# Patient Record
Sex: Female | Born: 1950 | Race: Black or African American | Hispanic: No | Smoking: Never smoker
Health system: Southern US, Community
[De-identification: ages and names within clinical notes are randomized; demographics above are authoritative.]

## PROBLEM LIST (undated history)

## (undated) DIAGNOSIS — E119 Type 2 diabetes mellitus without complications: Secondary | ICD-10-CM

## (undated) DIAGNOSIS — E079 Disorder of thyroid, unspecified: Secondary | ICD-10-CM

## (undated) DIAGNOSIS — N189 Chronic kidney disease, unspecified: Secondary | ICD-10-CM

## (undated) DIAGNOSIS — T7840XA Allergy, unspecified, initial encounter: Secondary | ICD-10-CM

## (undated) DIAGNOSIS — Z950 Presence of cardiac pacemaker: Secondary | ICD-10-CM

## (undated) DIAGNOSIS — L80 Vitiligo: Secondary | ICD-10-CM

## (undated) DIAGNOSIS — R001 Bradycardia, unspecified: Secondary | ICD-10-CM

## (undated) DIAGNOSIS — M1990 Unspecified osteoarthritis, unspecified site: Secondary | ICD-10-CM

## (undated) DIAGNOSIS — E785 Hyperlipidemia, unspecified: Secondary | ICD-10-CM

## (undated) DIAGNOSIS — M797 Fibromyalgia: Secondary | ICD-10-CM

## (undated) DIAGNOSIS — M5430 Sciatica, unspecified side: Secondary | ICD-10-CM

## (undated) DIAGNOSIS — E039 Hypothyroidism, unspecified: Secondary | ICD-10-CM

## (undated) DIAGNOSIS — F411 Generalized anxiety disorder: Secondary | ICD-10-CM

## (undated) DIAGNOSIS — R569 Unspecified convulsions: Secondary | ICD-10-CM

## (undated) DIAGNOSIS — G4730 Sleep apnea, unspecified: Secondary | ICD-10-CM

## (undated) DIAGNOSIS — I639 Cerebral infarction, unspecified: Secondary | ICD-10-CM

## (undated) DIAGNOSIS — H269 Unspecified cataract: Secondary | ICD-10-CM

## (undated) DIAGNOSIS — G459 Transient cerebral ischemic attack, unspecified: Secondary | ICD-10-CM

## (undated) DIAGNOSIS — J449 Chronic obstructive pulmonary disease, unspecified: Secondary | ICD-10-CM

## (undated) DIAGNOSIS — I10 Essential (primary) hypertension: Secondary | ICD-10-CM

## (undated) DIAGNOSIS — F1190 Opioid use, unspecified, uncomplicated: Secondary | ICD-10-CM

## (undated) HISTORY — PX: LAPAROSCOPIC GASTRIC BAND REMOVAL WITH LAPAROSCOPIC GASTRIC SLEEVE RESECTION: SHX6498

## (undated) HISTORY — DX: Hyperlipidemia, unspecified: E78.5

## (undated) HISTORY — PX: COLONOSCOPY: SHX174

## (undated) HISTORY — DX: Cerebral infarction, unspecified: I63.9

## (undated) HISTORY — DX: Fibromyalgia: M79.7

## (undated) HISTORY — DX: Opioid use, unspecified, uncomplicated: F11.90

## (undated) HISTORY — PX: ABDOMINAL HYSTERECTOMY: SHX81

## (undated) HISTORY — PX: SPINE SURGERY: SHX786

## (undated) HISTORY — PX: LAPAROSCOPIC GASTRIC BANDING: SHX1100

## (undated) HISTORY — DX: Chronic obstructive pulmonary disease, unspecified: J44.9

## (undated) HISTORY — DX: Disorder of thyroid, unspecified: E07.9

## (undated) HISTORY — DX: Sleep apnea, unspecified: G47.30

## (undated) HISTORY — DX: Unspecified osteoarthritis, unspecified site: M19.90

## (undated) HISTORY — DX: Type 2 diabetes mellitus without complications: E11.9

## (undated) HISTORY — DX: Vitiligo: L80

## (undated) HISTORY — PX: PACEMAKER INSERTION: SHX728

## (undated) HISTORY — DX: Bradycardia, unspecified: R00.1

## (undated) HISTORY — PX: CHOLECYSTECTOMY: SHX55

## (undated) HISTORY — DX: Sciatica, unspecified side: M54.30

## (undated) HISTORY — DX: Unspecified convulsions: R56.9

## (undated) HISTORY — DX: Unspecified cataract: H26.9

## (undated) HISTORY — DX: Generalized anxiety disorder: F41.1

## (undated) HISTORY — DX: Allergy, unspecified, initial encounter: T78.40XA

## (undated) HISTORY — DX: Chronic kidney disease, unspecified: N18.9

## (undated) HISTORY — DX: Transient cerebral ischemic attack, unspecified: G45.9

---

## 2004-02-19 ENCOUNTER — Ambulatory Visit: Payer: Self-pay

## 2004-03-05 ENCOUNTER — Inpatient Hospital Stay: Payer: Self-pay

## 2004-03-05 ENCOUNTER — Other Ambulatory Visit: Payer: Self-pay

## 2004-04-06 ENCOUNTER — Ambulatory Visit: Payer: Self-pay | Admitting: Cardiovascular Disease

## 2005-02-22 ENCOUNTER — Ambulatory Visit: Payer: Self-pay

## 2005-08-19 ENCOUNTER — Ambulatory Visit: Payer: Self-pay

## 2006-04-01 ENCOUNTER — Emergency Department: Payer: Self-pay | Admitting: Unknown Physician Specialty

## 2006-06-17 DIAGNOSIS — M51379 Other intervertebral disc degeneration, lumbosacral region without mention of lumbar back pain or lower extremity pain: Secondary | ICD-10-CM | POA: Insufficient documentation

## 2006-10-19 DIAGNOSIS — I10 Essential (primary) hypertension: Secondary | ICD-10-CM | POA: Insufficient documentation

## 2006-10-20 DIAGNOSIS — E785 Hyperlipidemia, unspecified: Secondary | ICD-10-CM

## 2006-10-20 HISTORY — DX: Hyperlipidemia, unspecified: E78.5

## 2007-06-11 ENCOUNTER — Emergency Department: Payer: Self-pay | Admitting: Emergency Medicine

## 2007-06-12 ENCOUNTER — Other Ambulatory Visit: Payer: Self-pay

## 2008-05-17 ENCOUNTER — Inpatient Hospital Stay: Payer: Self-pay | Admitting: Specialist

## 2008-10-03 ENCOUNTER — Emergency Department: Payer: Self-pay | Admitting: Emergency Medicine

## 2008-12-09 DIAGNOSIS — I6529 Occlusion and stenosis of unspecified carotid artery: Secondary | ICD-10-CM | POA: Insufficient documentation

## 2009-02-09 DIAGNOSIS — F411 Generalized anxiety disorder: Secondary | ICD-10-CM

## 2009-02-09 HISTORY — DX: Generalized anxiety disorder: F41.1

## 2009-08-01 ENCOUNTER — Emergency Department: Payer: Self-pay | Admitting: Emergency Medicine

## 2009-12-20 ENCOUNTER — Inpatient Hospital Stay: Payer: Self-pay | Admitting: Internal Medicine

## 2010-02-24 ENCOUNTER — Inpatient Hospital Stay: Payer: Self-pay | Admitting: Internal Medicine

## 2010-04-26 DIAGNOSIS — G2581 Restless legs syndrome: Secondary | ICD-10-CM | POA: Insufficient documentation

## 2010-05-18 ENCOUNTER — Inpatient Hospital Stay: Payer: Self-pay | Admitting: *Deleted

## 2010-07-05 ENCOUNTER — Emergency Department: Payer: Self-pay | Admitting: Emergency Medicine

## 2010-09-14 ENCOUNTER — Emergency Department: Payer: Self-pay | Admitting: Emergency Medicine

## 2010-12-27 DIAGNOSIS — H5200 Hypermetropia, unspecified eye: Secondary | ICD-10-CM | POA: Insufficient documentation

## 2011-02-18 ENCOUNTER — Emergency Department: Payer: Self-pay | Admitting: Emergency Medicine

## 2011-05-19 ENCOUNTER — Inpatient Hospital Stay: Payer: Self-pay | Admitting: Internal Medicine

## 2012-09-01 DIAGNOSIS — M797 Fibromyalgia: Secondary | ICD-10-CM | POA: Insufficient documentation

## 2012-09-01 DIAGNOSIS — L80 Vitiligo: Secondary | ICD-10-CM | POA: Insufficient documentation

## 2012-09-01 DIAGNOSIS — R001 Bradycardia, unspecified: Secondary | ICD-10-CM | POA: Insufficient documentation

## 2012-09-01 DIAGNOSIS — R569 Unspecified convulsions: Secondary | ICD-10-CM | POA: Insufficient documentation

## 2013-08-28 DIAGNOSIS — R238 Other skin changes: Secondary | ICD-10-CM | POA: Insufficient documentation

## 2013-11-16 DIAGNOSIS — L659 Nonscarring hair loss, unspecified: Secondary | ICD-10-CM | POA: Insufficient documentation

## 2014-06-10 DIAGNOSIS — G894 Chronic pain syndrome: Secondary | ICD-10-CM | POA: Insufficient documentation

## 2014-07-15 DIAGNOSIS — Z6841 Body Mass Index (BMI) 40.0 and over, adult: Secondary | ICD-10-CM | POA: Insufficient documentation

## 2014-08-19 ENCOUNTER — Emergency Department
Admit: 2014-08-19 | Disposition: A | Discharge status: Home / Self Care | Payer: Self-pay | Admitting: Emergency Medicine

## 2014-09-12 NOTE — Discharge Summary (Signed)
Gardner NAME:  Carrie Gardner, WHAN MR#:  [id number] DATE OF BIRTH:  1951-02-13  DATE OF ADMISSION:  05/19/2011 DATE OF DISCHARGE:  05/20/2011  PRIMARY CARE PHYSICIAN:  Dr. Brynda Peon, [facility]. CARDIOLOGIST: [facility].  DISCHARGE DIAGNOSES:  1. Chest pain, likely noncardiac, likely migraine variant. Negative serial cardiac enzymes.  2. Head/neck pain, likely migraine, on sumatriptan as needed at home, which works well.  3. Hypothyroidism with elevated thyroid stimulating hormone. Adjusted dose of Synthroid.   SECONDARY DIAGNOSES:  1. Morbid obesity.  2. Gastroesophageal reflux disease. 3. Anxiety. 4. Anemia of chronic disease.  5. Restless leg syndrome. 6. Type 2 diabetes.  7. Hyperlipidemia.  8. Hypothyroidism.  9. Peripheral vascular disease.   CONSULTATION: Cardiology, Dr. Gwen Pounds.   PROCEDURES/RADIOLOGY: Chest x-ray on Carrie 29th of December showed no acute cardiopulmonary disease.   MAJOR LABORATORY PANEL: Urinalysis on admission was negative.   HISTORY AND SHORT HOSPITAL COURSE: Carrie Gardner is a 64 year old female with Carrie above-mentioned medical problems who was admitted for chest pain associated with sweating and shortness of breath which woke her up from sleep. Please see Dr. Judithann Sheen dictated history and physical for further details. She was admitted to telemetry. Ruled out with three negative sets of cardiac enzymes. She has a known history of hypothyroidism for which thyroid stimulating hormone was checked and was found to be elevated with a value of 7.28. Her Synthroid dose was increased from 112 mcg to 125 mcg. She was doing much better. On 12/30, did not have any subsequent chest pain. Cardiology consult was obtained with Dr. Gwen Pounds who agreed with Carrie management and recommended discharge with outpatient follow-up which was done.   PERTINENT PHYSICAL EXAMINATION:  On Carrie date of discharge, her vital signs were as follows: Temperature 98.1, heart rate 74 per minute, respirations 18 per  minute, blood pressure 142/91. She was saturating 97% on room air. CARDIOVASCULAR: S1, S2 normal. No murmurs, rubs, or gallop. LUNGS: Clear to auscultation bilaterally. No wheezing, rales, rhonchi, or crepitation. ABDOMEN: Soft, benign, obesity. NEUROLOGICAL: Nonfocal examination. All other physical examination remained at baseline.   DISCHARGE MEDICATIONS:  1. Benadryl 25 mg 2 tablets p.o. every six hours.  2. Tramadol 50 mg 1 tablet p.o. every six hours as needed.  3. Metoprolol 25 mg p.o. b.i.d.  4. Omeprazole 20 mg p.o. daily. 5. Lipitor 20 mg p.o. daily. 6. Aspirin 325 mg p.o. daily.  7. Gabapentin 300 mg p.o. 3 times a day.  8. Lipitor 20 mg p.o. at bedtime. 9. Ropinirole 2 mg p.o. daily.  10. Citalopram 20 mg p.o. daily.  11. Iron sulfate once daily.  12. Lisinopril 5 mg p.o. daily.  13. Insulin Lantus 15 units subcutaneous at bedtime.  14. NovoLog subcutaneous 8 units 3 times a day.  15. Levothyroxine 125 micrograms p.o. daily.   DISCHARGE DIET: Low sodium, 1,800 ADA.   DISCHARGE ACTIVITY: As tolerated.   DISCHARGE INSTRUCTIONS AND FOLLOW-UP:  1. Carrie Gardner was instructed follow-up with her primary care physician, Dr. Brynda Peon at [facility] in 1 to 2 weeks.  2. She will need follow-up with [facility] in one week.     TOTAL TIME DISCHARGING THIS Gardner: 55 minutes.    ____________________________ Ellamae Sia. Sherryll Burger, MD vss:ap D: 05/20/2011 20:36:49 ET T: 05/24/2011 10:39:52 ET JOB#: [id number]  cc: Lyndel Dancel S. Sherryll Burger, MD, <Dictator> [facility] Cardiology Dr. Brynda Peon, [facility]

## 2014-09-12 NOTE — Discharge Summary (Signed)
PATIENT NAME:  Carrie Gardner, Carrie Gardner MR#:  [id number] DATE OF BIRTH:  1950/10/23  DATE OF ADMISSION:  05/19/2011 DATE OF DISCHARGE:  05/20/2011  PRIMARY CARE PHYSICIAN:  Dr. Brynda Peon, [facility]. CARDIOLOGIST: [facility].  DISCHARGE DIAGNOSES:  1. Chest pain, likely noncardiac, likely migraine variant. Negative serial cardiac enzymes.  2. Head/neck pain, likely migraine, on sumatriptan as needed at home, which works well.  3. Hypothyroidism with elevated thyroid stimulating hormone. Adjusted dose of Synthroid.   SECONDARY DIAGNOSES:  1. Morbid obesity.  2. Gastroesophageal reflux disease. 3. Anxiety. 4. Anemia of chronic disease.  5. Restless leg syndrome. 6. Type 2 diabetes.  7. Hyperlipidemia.  8. Hypothyroidism.  9. Peripheral vascular disease.   CONSULTATION: Cardiology, Dr. Gwen Pounds.   PROCEDURES/RADIOLOGY: Chest x-ray on the 29th of December showed no acute cardiopulmonary disease.   MAJOR LABORATORY PANEL: Urinalysis on admission was negative.   HISTORY AND SHORT HOSPITAL COURSE: The patient is a 64 year old female with the above-mentioned medical problems who was admitted for chest pain associated with sweating and shortness of breath which woke her up from sleep. Please see Dr. Judithann Sheen' dictated history and physical for further details. She was admitted to telemetry. Ruled out with three negative sets of cardiac enzymes. She has a known history of hypothyroidism for which thyroid stimulating hormone was checked and was found to be elevated with a value of 7.28. Her Synthroid dose was increased from 112 mcg to 125 mcg. She was doing much better. On 12/30, did not have any subsequent chest pain. Cardiology consult was obtained with Dr. Gwen Pounds who agreed with the management and recommended discharge with outpatient follow-up which was done.   PERTINENT PHYSICAL EXAMINATION:  On the date of discharge, her vital signs were as follows: Temperature 98.1, heart rate 74 per minute, respirations 18  per minute, blood pressure 142/91. She was saturating 97% on room air. CARDIOVASCULAR: S1, S2 normal. No murmurs, rubs, or gallop. LUNGS: Clear to auscultation bilaterally. No wheezing, rales, rhonchi, or crepitation. ABDOMEN: Soft, benign, obesity. NEUROLOGICAL: Nonfocal examination. All other physical examination remained at baseline.   DISCHARGE MEDICATIONS:  1. Benadryl 25 mg 2 tablets p.o. every six hours.  2. Tramadol 50 mg 1 tablet p.o. every six hours as needed.  3. Metoprolol 25 mg p.o. b.i.d.  4. Omeprazole 20 mg p.o. daily. 5. Lipitor 20 mg p.o. daily. 6. Aspirin 325 mg p.o. daily.  7. Gabapentin 300 mg p.o. 3 times a day.  8. Lipitor 20 mg p.o. at bedtime. 9. Ropinirole 2 mg p.o. daily.  10. Citalopram 20 mg p.o. daily.  11. Iron sulfate once daily.  12. Lisinopril 5 mg p.o. daily.  13. Insulin Lantus 15 units subcutaneous at bedtime.  14. NovoLog subcutaneous 8 units 3 times a day.  15. Levothyroxine 125 micrograms p.o. daily.   DISCHARGE DIET: Low sodium, 1,800 ADA.   DISCHARGE ACTIVITY: As tolerated.   DISCHARGE INSTRUCTIONS AND FOLLOW-UP:  1. The patient was instructed follow-up with her primary care physician, Dr. Brynda Peon at [facility] in 1 to 2 weeks.  2. She will need follow-up with [facility] in one week.     TOTAL TIME DISCHARGING THIS PATIENT: 55 minutes.    ____________________________ Ellamae Sia. Sherryll Burger, MD vss:ap D: 05/20/2011 20:36:00 ET T: 05/24/2011 10:39:52 ET JOB#: [id number]  cc: [facility] Cardiology Chidiebere Wynn S. Sherryll Burger, MD, <Dictator> Dr. Brynda Peon, [facility]  Ellamae Sia [facility] MD ELECTRONICALLY SIGNED 05/27/2011 22:20

## 2015-02-09 ENCOUNTER — Encounter: Payer: Self-pay | Admitting: Anesthesiology

## 2015-02-09 ENCOUNTER — Ambulatory Visit: Payer: Medicare Other | Attending: Anesthesiology | Admitting: Anesthesiology

## 2015-02-09 VITALS — BP 160/76 | HR 89 | Temp 97.7°F | Resp 18 | Ht 62.0 in | Wt 245.0 lb

## 2015-02-09 DIAGNOSIS — E119 Type 2 diabetes mellitus without complications: Secondary | ICD-10-CM

## 2015-02-09 DIAGNOSIS — E669 Obesity, unspecified: Secondary | ICD-10-CM

## 2015-02-09 DIAGNOSIS — M545 Low back pain, unspecified: Secondary | ICD-10-CM

## 2015-02-09 DIAGNOSIS — M5136 Other intervertebral disc degeneration, lumbar region: Secondary | ICD-10-CM | POA: Diagnosis not present

## 2015-02-09 DIAGNOSIS — G8929 Other chronic pain: Secondary | ICD-10-CM | POA: Insufficient documentation

## 2015-02-09 DIAGNOSIS — R001 Bradycardia, unspecified: Secondary | ICD-10-CM

## 2015-02-09 DIAGNOSIS — Z950 Presence of cardiac pacemaker: Secondary | ICD-10-CM | POA: Diagnosis not present

## 2015-02-09 DIAGNOSIS — M51379 Other intervertebral disc degeneration, lumbosacral region without mention of lumbar back pain or lower extremity pain: Secondary | ICD-10-CM

## 2015-02-09 DIAGNOSIS — I739 Peripheral vascular disease, unspecified: Secondary | ICD-10-CM | POA: Diagnosis not present

## 2015-02-09 DIAGNOSIS — L80 Vitiligo: Secondary | ICD-10-CM

## 2015-02-09 DIAGNOSIS — I10 Essential (primary) hypertension: Secondary | ICD-10-CM

## 2015-02-09 DIAGNOSIS — M5416 Radiculopathy, lumbar region: Secondary | ICD-10-CM | POA: Diagnosis not present

## 2015-02-09 DIAGNOSIS — M5137 Other intervertebral disc degeneration, lumbosacral region: Secondary | ICD-10-CM

## 2015-02-09 NOTE — Patient Instructions (Signed)
Epidural Steroid Injection Patient Information  Description: The epidural space surrounds the nerves as they exit the spinal cord.  In some patients, the nerves can be compressed and inflamed by a bulging disc or a tight spinal canal (spinal stenosis).  By injecting steroids into the epidural space, we can bring irritated nerves into direct contact with a potentially helpful medication.  These steroids act directly on the irritated nerves and can reduce swelling and inflammation which often leads to decreased pain.  Epidural steroids may be injected anywhere along the spine and from the neck to the low back depending upon the location of your pain.   After numbing the skin with local anesthetic (like Novocaine), a small needle is passed into the epidural space slowly.  You may experience a sensation of pressure while this is being done.  The entire block usually last less than 10 minutes.  Conditions which may be treated by epidural steroids:   Low back and leg pain  Neck and arm pain  Spinal stenosis  Post-laminectomy syndrome  Herpes zoster (shingles) pain  Pain from compression fractures  Preparation for the injection:  1. Do not eat any solid food or dairy products within 6 hours of your appointment.  2. You may drink clear liquids up to 2 hours before appointment.  Clear liquids include water, black coffee, juice or soda.  No milk or cream please. 3. You may take your regular medication, including pain medications, with a sip of water before your appointment  Diabetics should hold regular insulin (if taken separately) and take 1/2 normal NPH dos the morning of the procedure.  Carry some sugar containing items with you to your appointment. 4. A driver must accompany you and be prepared to drive you home after your procedure.  5. Bring all your current medications with your. 6. An IV may be inserted and sedation may be given at the discretion of the physician.   7. A blood pressure  cuff, EKG and other monitors will often be applied during the procedure.  Some patients may need to have extra oxygen administered for a short period. 8. You will be asked to provide medical information, including your allergies, prior to the procedure.  We must know immediately if you are taking blood thinners (like Coumadin/Warfarin)  Or if you are allergic to IV iodine contrast (dye). We must know if you could possible be pregnant.  Possible side-effects:  Bleeding from needle site  Infection (rare, may require surgery)  Nerve injury (rare)  Numbness & tingling (temporary)  Difficulty urinating (rare, temporary)  Spinal headache ( a headache worse with upright posture)  Light -headedness (temporary)  Pain at injection site (several days)  Decreased blood pressure (temporary)  Weakness in arm/leg (temporary)  Pressure sensation in back/neck (temporary)  Call if you experience:  Fever/chills associated with headache or increased back/neck pain.  Headache worsened by an upright position.  New onset weakness or numbness of an extremity below the injection site  Hives or difficulty breathing (go to the emergency room)  Inflammation or drainage at the infection site  Severe back/neck pain  Any new symptoms which are concerning to you  Please note:  Although the local anesthetic injected can often make your back or neck feel good for several hours after the injection, the pain will likely return.  It takes 3-7 days for steroids to work in the epidural space.  You may not notice any pain relief for at least that one week.    If effective, we will often do a series of three injections spaced 3-6 weeks apart to maximally decrease your pain.  After the initial series, we generally will wait several months before considering a repeat injection of the same type.  If you have any questions, please call [phone] [facility] 

## 2015-02-09 NOTE — Progress Notes (Signed)
Safety precautions to be maintained throughout the outpatient stay will include: orient to surroundings, keep bed in low position, maintain call bell within reach at all times, provide assistance with transfer out of bed and ambulation.  

## 2015-02-10 NOTE — Progress Notes (Signed)
Subjective:    Patient ID: Carrie Gardner, female    DOB: 06-Mar-1951, 64 y.o.   MRN: [id number]  HPI  This patient is a pleasant delightful 64 year old lady who came in complaining chronic low back pain.  Her pain was localized in the caudal ar and has been present for the past 7. The pain started spontaneously and was not associated with any trauma or injury. She has been treated with injections epidurals medicines patches and physical therapy. She describes her pa as sharp and intermittent and that it radiates down both legs with the left being worse than the right. The pain radiates the lower legs down into the ankles and the feet and she experiences cramps in both great toes. Her subjective pain intensity rating  Is 80% Her pain is relieved by medications and hot  Pads and the pain is aggravated by gener especially prolonged walking.  Pain medication Currently she takes hydrocodone with acetaminophen 10/325 mg 1 tabl every 6 hours  Other medications Other medications include aspirin  Lipitor Plavix Lasix insulin Novolin Synthroid lisinopril Mirapex and Requip and venlafaxine.  Allergies She is allergic to tramadol IV dye metformin and Lyrica  Past medical history Past medical history is positive for diabetes hypercholesterolemia hypertension peripheral vascular disease bradycardia fibromyalgia and vitiligo  Past surgical history Past surgical history is positive for total abdominal hystere salpingo-oophorectomy cholecystectomy a LAP-BAND surgery and back surgery.  He is also had excision of lipoma from the right shoulder.  Social and economic history ThIs patient does not smoke cigarettes She does not use illicit drugs She uses alcohol occasionally  Family history Her mother is deceased at age 37 from kidney failure Her father is deceased at age 57 from a cerebrovascular accident She has no brothers  She has no sisters She has been divorced for 10 yearsand she has one  child age 63 was Karie Mainland and one granddaughter aged 22 and she a Programmer, multimedia.   Review of Systems  Constitutional: Negative for fever, chills, diaphoresis, activity change, appetite change, fatigue and unexpected weight change.  HENT: Negative for congestion, dental problem, drooling, ear discharge, ear pain, facial swelling, hearing loss, mouth sores, nosebleeds, postnasal drip, rhinorrhea and sinus pressure.   Eyes: Negative.  Negative for photophobia, pain, discharge, redness, itching and visual disturbance.  Respiratory: Negative.  Negative for apnea, cough, choking, chest tightness, shortness of breath, wheezing and stridor.   Cardiovascular: Negative for chest pain, palpitations and leg swelling.  Endocrine: Negative.   Genitourinary: Negative.   Musculoskeletal: Positive for back pain and arthralgias. Negative for myalgias, joint swelling, gait problem, neck pain and neck stiffness.  Skin: Positive for color change. Negative for pallor, rash and wound.       The patient displayed significant depigmentation characteristic of vitiligo in both lower forearms and in both hands and feet  Allergic/Immunologic: Negative.   Neurological: Negative.   Hematological: Negative.   Psychiatric/Behavioral: Negative.        Objective:   Physical Exam  Constitutional: She is oriented to person, place, and time. She appears well-developed and well-nourished.  This patient is currently weighing 244 pounds. This represents a significant decrease from her prior weight of 300 pounds. She attributes this decreased to the LAP-BAND surgery and her diet.  HENT:  Head: Normocephalic and atraumatic.  Right Ear: External ear normal.  Nose: Nose normal.  Mouth/Throat: Oropharynx is clear and moist.  Eyes: Conjunctivae and EOM are normal. Pupils are equal, round, and reactive to light.  Right eye exhibits no discharge. Left eye exhibits no discharge. No scleral icterus.  Neck: Normal range of motion. Neck supple.  No JVD present. No tracheal deviation present. No thyromegaly present.  Cardiovascular: Normal rate, regular rhythm, normal heart sounds and intact distal pulses.  Exam reveals no gallop and no friction rub.   No murmur heard. HHeart sounds 1 and 2 were heard in er blood pressure is 160/75 mmHg Her pulse is 89 bpm Equal and regular Heart sounds 1 and 2 were heard in all areas There were no audible murmurs   Pulmonary/Chest: Effort normal and breath sounds normal. No respiratory distress. She has no wheezes. She has no rales. She exhibits no tenderness.  Abdominal: Soft. Bowel sounds are normal. She exhibits no distension and no mass. There is no tenderness. There is no rebound.  Genitourinary:  Genitourinary exam was deferred  Musculoskeletal: She exhibits no edema or tenderness.  Range of motion was decreased in both lower extremites. The torsion tests was positive espec Straight leg raising tests were  Normal on both sides There was tenderness in the sacrococcygeal area and the region of L5  Lymphadenopathy:    She has no cervical adenopathy.  Neurological: She is alert and oriented to person, place, and time. She has normal reflexes. She displays normal reflexes. No cranial nerve deficit. She exhibits normal muscle tone. Coordination normal.  Skin: Skin is warm and dry. Rash noted. No pallor.  She had pronounced vitiligo affecting both hands and feet and the lower aspects of her both forearms  Psychiatric: She has a normal mood and affect. Her behavior is normal. Judgment and thought content normal.  Nursing note and vitals reviewed.      Assessment & Plan:   Assessment 1 CHRONIC LOW BACK PAIN 2 LUMBAR DEGENERATIVE DISC DISEASE 3 LUMBAR RADICULOPATHY 4 OBESITY 5 STATUS POST PACEMAKER iMPLANTATION 6 BRADYCARDIA 7 PERIPHERAL VASCULAR DISEASE 8 VITILIGO 9 DIABETES MELLITUS 10 HYPERTENSION    PLAN OF MANAGEMENT 1 CAUDAL EPIDURAL STEROID INJECTION WITH ANTIBIOTIC  PROPHYLAXIS AND NO CONTRAST MEDIA WOULD BE USED 2 CONSIDER OTHER THERAPEUTIC OPTIONS IF THE CAUDAL EPIDURAL STEROID INJECTION IS NOT SUCCESSFUL   New patient   Level 4   Winston Parris M.D.

## 2015-02-11 ENCOUNTER — Ambulatory Visit: Payer: Medicare Other | Attending: Anesthesiology | Admitting: Anesthesiology

## 2015-02-11 ENCOUNTER — Encounter: Payer: Self-pay | Admitting: Anesthesiology

## 2015-02-11 VITALS — BP 152/81 | HR 83 | Temp 98.0°F | Resp 16 | Ht 62.0 in | Wt 244.0 lb

## 2015-02-11 DIAGNOSIS — M5136 Other intervertebral disc degeneration, lumbar region: Secondary | ICD-10-CM | POA: Insufficient documentation

## 2015-02-11 DIAGNOSIS — G8929 Other chronic pain: Secondary | ICD-10-CM | POA: Insufficient documentation

## 2015-02-11 DIAGNOSIS — M545 Low back pain, unspecified: Secondary | ICD-10-CM

## 2015-02-11 DIAGNOSIS — M5416 Radiculopathy, lumbar region: Secondary | ICD-10-CM | POA: Insufficient documentation

## 2015-02-11 DIAGNOSIS — M5137 Other intervertebral disc degeneration, lumbosacral region: Secondary | ICD-10-CM

## 2015-02-11 MED ORDER — HYDROCODONE-ACETAMINOPHEN 10-325 MG PO TABS: 1.0000 | ORAL_TABLET | Freq: Three times a day (TID) | ORAL | Status: DC | PRN

## 2015-02-11 MED ORDER — BUPIVACAINE HCL (PF) 0.25 % IJ SOLN
INTRAMUSCULAR | Status: AC
  Administered 2015-02-11: 10:00:00
  Filled 2015-02-11: qty 30

## 2015-02-11 MED ORDER — MIDAZOLAM HCL 5 MG/5ML IJ SOLN
INTRAMUSCULAR | Status: AC
  Administered 2015-02-11: 2 mg via INTRAVENOUS
  Filled 2015-02-11: qty 5

## 2015-02-11 MED ORDER — FENTANYL CITRATE (PF) 100 MCG/2ML IJ SOLN
INTRAMUSCULAR | Status: AC
  Administered 2015-02-11: 50 ug via INTRAVENOUS
  Filled 2015-02-11: qty 2

## 2015-02-11 MED ORDER — LIDOCAINE HCL (PF) 1 % IJ SOLN
INTRAMUSCULAR | Status: AC
  Filled 2015-02-11: qty 5

## 2015-02-11 MED ORDER — TRIAMCINOLONE ACETONIDE 40 MG/ML IJ SUSP
INTRAMUSCULAR | Status: AC
  Administered 2015-02-11: 10:00:00
  Filled 2015-02-11: qty 2

## 2015-02-11 NOTE — Procedures (Signed)
Date of procedure: 02/11/2015  Preoperative Diagnosis:  1 chronic low back pain 2 lumbar degenerative disc disease 3 lumbar radiculopathy  Postoperative Diagnosis:  Same.  Procedure: 1. Caudal epidural steroid injection, 2.   Fluoroscopic guidance.  Surgeon: Tod Persia, MD  Anesthesia: MAC anesthesia  by the nurse and staff under my direction  Informed consent was obtained and the patient appeared to accept and understand the benefits and risks of this procedure.   Pre procedure comments:  Patient was allergic to contrast media so no contrast was used in this procedure Thus, no epidurography was performed  Description of the Procedure:  The patient was taken to the operating room and placed in the prone position.  Intravenous sedation and MAC anesthesia was administered by the nurse and staff under my direction. After appropriate sedation, the sacrococcygeal area was prepped with Betadine.  After adequate draping, the area between the sacral cornu was palpated and infiltrated with 3 cc of 1% Lidocaine.   An AP fluoroscopic view of the sacrum was visualized and a 17 gauge Tuohy needle was inserted in the midline at the angle of 45 degrees through the sacrococcygeal membrane.  After making contact with the bone, the needle was withdrawn and readvanced in horizontal position, into the caudal epidural space.  Epidurogram Study: No epidurogram study was performed Needle verification was done by you in its placement in both AP and the lateral views    Comments: No catheter was used  Caudal Epidural Steroid Injection:  Then 10 cc of 0.25% Bupivacaine and 15 mg of  were injected into the Caudal epidural space.  The needle was removed and adequate hemostasis was established.    The patient tolerated the procedure quite well and vital signs were stable.  There were no adverse effects.  Additional comments:    The patient was taken to the recovery room in satisfactory  condition where the patient was observed and subsequently discharged home.  Will follow up in the clinic in the next week.   Tod Persia M.D.

## 2015-02-11 NOTE — Patient Instructions (Signed)
Epidural Steroid Injection Patient Information  Description: The epidural space surrounds the nerves as they exit the spinal cord.  In some patients, the nerves can be compressed and inflamed by a bulging disc or a tight spinal canal (spinal stenosis).  By injecting steroids into the epidural space, we can bring irritated nerves into direct contact with a potentially helpful medication.  These steroids act directly on the irritated nerves and can reduce swelling and inflammation which often leads to decreased pain.  Epidural steroids may be injected anywhere along the spine and from the neck to the low back depending upon the location of your pain.   After numbing the skin with local anesthetic (like Novocaine), a small needle is passed into the epidural space slowly.  You may experience a sensation of pressure while this is being done.  The entire block usually last less than 10 minutes.  Conditions which may be treated by epidural steroids:   Low back and leg pain  Neck and arm pain  Spinal stenosis  Post-laminectomy syndrome  Herpes zoster (shingles) pain  Pain from compression fractures  Preparation for the injection:  1. Do not eat any solid food or dairy products within 6 hours of your appointment.  2. You may drink clear liquids up to 2 hours before appointment.  Clear liquids include water, black coffee, juice or soda.  No milk or cream please. 3. You may take your regular medication, including pain medications, with a sip of water before your appointment  Diabetics should hold regular insulin (if taken separately) and take 1/2 normal NPH dos the morning of the procedure.  Carry some sugar containing items with you to your appointment. 4. A driver must accompany you and be prepared to drive you home after your procedure.  5. Bring all your current medications with your. 6. An IV may be inserted and sedation may be given at the discretion of the physician.   7. A blood pressure  cuff, EKG and other monitors will often be applied during the procedure.  Some patients may need to have extra oxygen administered for a short period. 8. You will be asked to provide medical information, including your allergies, prior to the procedure.  We must know immediately if you are taking blood thinners (like Coumadin/Warfarin)  Or if you are allergic to IV iodine contrast (dye). We must know if you could possible be pregnant.  Possible side-effects:  Bleeding from needle site  Infection (rare, may require surgery)  Nerve injury (rare)  Numbness & tingling (temporary)  Difficulty urinating (rare, temporary)  Spinal headache ( a headache worse with upright posture)  Light -headedness (temporary)  Pain at injection site (several days)  Decreased blood pressure (temporary)  Weakness in arm/leg (temporary)  Pressure sensation in back/neck (temporary)  Call if you experience:  Fever/chills associated with headache or increased back/neck pain.  Headache worsened by an upright position.  New onset weakness or numbness of an extremity below the injection site  Hives or difficulty breathing (go to the emergency room)  Inflammation or drainage at the infection site  Severe back/neck pain  Any new symptoms which are concerning to you  Please note:  Although the local anesthetic injected can often make your back or neck feel good for several hours after the injection, the pain will likely return.  It takes 3-7 days for steroids to work in the epidural space.  You may not notice any pain relief for at least that one week.    If effective, we will often do a series of three injections spaced 3-6 weeks apart to maximally decrease your pain.  After the initial series, we generally will wait several months before considering a repeat injection of the same type.  If you have any questions, please call [phone] [facility]  Discharge Instructions  General Discharge Instructions :  If you need to reach your doctor call: Monday-Friday 8:00 am - 4:00 pm at [phone] or toll free [phone].  After clinic hours [phone] to have operator reach doctor.  Bring all of your medication bottles to all your appointments in the pain clinic.  To cancel or reschedule your appointment with Pain Management please remember to call 24 hours in advance to avoid a fee.  Refer to the educational materials which you have been given on: General Risks, I had my Procedure. Discharge Instructions, Post Sedation.  Post Procedure Instructions:  The drugs you were given will stay in your system until tomorrow, so for the next 24 hours you should not drive, make any legal decisions or drink any alcoholic beverages.  You may eat anything you prefer, but it is better to start with liquids then soups and crackers, and gradually work up to solid foods.  Please notify your doctor immediately if you have any unusual bleeding, trouble breathing or pain that is not related to your normal pain.  Depending on the type of procedure that was done, some parts of your body may feel week and/or numb.  This usually clears up by tonight or the next day.  Walk with the use of an assistive device or accompanied by an adult for the 24 hours.  You may use ice on the affected area for the first 24 hours.  Put ice in a Ziploc bag and cover with a towel and place against area 15 minutes on 15 minutes off.  You may switch to heat after 24 hours. 

## 2015-02-11 NOTE — Progress Notes (Signed)
Safety precautions to be maintained throughout the outpatient stay will include: orient to surroundings, keep bed in low position, maintain call bell within reach at all times, provide assistance with transfer out of bed and ambulation.  

## 2015-02-14 ENCOUNTER — Telehealth: Payer: Self-pay | Admitting: *Deleted

## 2015-02-14 NOTE — Telephone Encounter (Signed)
No problems post procedure phone call. 

## 2015-02-23 ENCOUNTER — Ambulatory Visit: Payer: Medicare Other | Attending: Anesthesiology | Admitting: Anesthesiology

## 2015-02-23 ENCOUNTER — Encounter: Payer: Self-pay | Admitting: Anesthesiology

## 2015-02-23 VITALS — BP 152/97 | HR 78 | Temp 97.7°F | Resp 20 | Ht 62.0 in | Wt 240.0 lb

## 2015-02-23 DIAGNOSIS — M545 Low back pain, unspecified: Secondary | ICD-10-CM

## 2015-02-23 DIAGNOSIS — M5136 Other intervertebral disc degeneration, lumbar region: Secondary | ICD-10-CM | POA: Diagnosis not present

## 2015-02-23 DIAGNOSIS — G8929 Other chronic pain: Secondary | ICD-10-CM | POA: Insufficient documentation

## 2015-02-23 DIAGNOSIS — E669 Obesity, unspecified: Secondary | ICD-10-CM | POA: Diagnosis not present

## 2015-02-23 DIAGNOSIS — M5137 Other intervertebral disc degeneration, lumbosacral region: Secondary | ICD-10-CM

## 2015-02-23 DIAGNOSIS — M5416 Radiculopathy, lumbar region: Secondary | ICD-10-CM | POA: Diagnosis not present

## 2015-02-23 MED ORDER — HYDROCODONE-ACETAMINOPHEN 10-325 MG PO TABS: 1.0000 | ORAL_TABLET | Freq: Two times a day (BID) | ORAL | Status: DC | PRN

## 2015-02-23 NOTE — Progress Notes (Signed)
Safety precautions to be maintained throughout the outpatient stay will include: orient to surroundings, keep bed in low position, maintain call bell within reach at all times, provide assistance with transfer out of bed and ambulation.  

## 2015-02-23 NOTE — Progress Notes (Signed)
   Subjective:    Patient ID: Carrie Gardner, female    DOB: Apr 18, 1951, 64 y.o.   MRN: [id number] This patient has returned to the clinic today indicating that she is doing quite well She appears in good spirits She indicated that following the caudal epidural steroid injection she got relief for several days but that the pain is gradually recovering Today her subjective pain intensity rating is 70% She indicates that she would love to have another caudal epidural steroid injection She had a flu shot today and would prefer to put off the injection for another 2 weeks She indicates that she is doing a little better with her activities of daily living  HPI    Review of Systems  Constitutional: Negative.   HENT: Negative.   Eyes: Negative.   Respiratory: Negative.   Cardiovascular: Negative.   Gastrointestinal: Negative.   Endocrine: Negative.   Genitourinary: Negative.   Musculoskeletal: Negative.   Skin: Negative.   Allergic/Immunologic: Negative.   Neurological: Negative.   Hematological: Negative.   Psychiatric/Behavioral: Negative.        Objective:   Physical Exam  Physical exam is unremarkable  Pressure is 152/97 mmHg  Pulse is 78 bpm Equal and regular Heart sounds 1 and 2 were heard in all areas There were no audible murmurs   temperature is 97.11F Respirations are 20 breaths per minute SPO2 was 100% Chest is clinically clear and there are no adventitious sounds  Her weight is 240 pound There is mild decrease in her range of motion of the lower extremities There are no new neurological or musculoskeletal findings Pupils are equal and reactive Cranial nerves are intact     Assessment & Plan:  Assessment 1 chronic low back pain 2 lumbar degenerative disc disease 3 lumbar radiculopathy 4 mild obesity   Plan of management Plan to repeat a caudal epidural steroid injection for her in the next 2 weeks since she had a flu shot recently I spent 50 minutes  chatted with her body weight management and change in her lifestyle as far as her diet is considered We'll plan to do the procedure for her in 2 weeks  Established patient   level II   Tod Persia M.D.

## 2015-02-23 NOTE — Patient Instructions (Signed)
Epidural Steroid Injection Patient Information  Description: The epidural space surrounds the nerves as they exit the spinal cord.  In some patients, the nerves can be compressed and inflamed by a bulging disc or a tight spinal canal (spinal stenosis).  By injecting steroids into the epidural space, we can bring irritated nerves into direct contact with a potentially helpful medication.  These steroids act directly on the irritated nerves and can reduce swelling and inflammation which often leads to decreased pain.  Epidural steroids may be injected anywhere along the spine and from the neck to the low back depending upon the location of your pain.   After numbing the skin with local anesthetic (like Novocaine), a small needle is passed into the epidural space slowly.  You may experience a sensation of pressure while this is being done.  The entire block usually last less than 10 minutes.  Conditions which may be treated by epidural steroids:   Low back and leg pain  Neck and arm pain  Spinal stenosis  Post-laminectomy syndrome  Herpes zoster (shingles) pain  Pain from compression fractures  Preparation for the injection:  1. Do not eat any solid food or dairy products within 6 hours of your appointment.  2. You may drink clear liquids up to 2 hours before appointment.  Clear liquids include water, black coffee, juice or soda.  No milk or cream please. 3. You may take your regular medication, including pain medications, with a sip of water before your appointment  Diabetics should hold regular insulin (if taken separately) and take 1/2 normal NPH dos the morning of the procedure.  Carry some sugar containing items with you to your appointment. 4. A driver must accompany you and be prepared to drive you home after your procedure.  5. Bring all your current medications with your. 6. An IV may be inserted and sedation may be given at the discretion of the physician.   7. A blood pressure  cuff, EKG and other monitors will often be applied during the procedure.  Some patients may need to have extra oxygen administered for a short period. 8. You will be asked to provide medical information, including your allergies, prior to the procedure.  We must know immediately if you are taking blood thinners (like Coumadin/Warfarin)  Or if you are allergic to IV iodine contrast (dye). We must know if you could possible be pregnant.  Possible side-effects:  Bleeding from needle site  Infection (rare, may require surgery)  Nerve injury (rare)  Numbness & tingling (temporary)  Difficulty urinating (rare, temporary)  Spinal headache ( a headache worse with upright posture)  Light -headedness (temporary)  Pain at injection site (several days)  Decreased blood pressure (temporary)  Weakness in arm/leg (temporary)  Pressure sensation in back/neck (temporary)  Call if you experience:  Fever/chills associated with headache or increased back/neck pain.  Headache worsened by an upright position.  New onset weakness or numbness of an extremity below the injection site  Hives or difficulty breathing (go to the emergency room)  Inflammation or drainage at the infection site  Severe back/neck pain  Any new symptoms which are concerning to you  Please note:  Although the local anesthetic injected can often make your back or neck feel good for several hours after the injection, the pain will likely return.  It takes 3-7 days for steroids to work in the epidural space.  You may not notice any pain relief for at least that one week.    If effective, we will often do a series of three injections spaced 3-6 weeks apart to maximally decrease your pain.  After the initial series, we generally will wait several months before considering a repeat injection of the same type.  If you have any questions, please call [phone] [facility] 

## 2015-02-23 NOTE — Addendum Note (Signed)
Addended by: Lorik Guo on: 02/23/2015 02:19 PM   Modules accepted: Orders  

## 2015-03-10 ENCOUNTER — Telehealth: Payer: Self-pay | Admitting: Anesthesiology

## 2015-03-10 ENCOUNTER — Emergency Department
Admission: EM | Admit: 2015-03-10 | Discharge: 2015-03-10 | Disposition: A | Discharge status: Home / Self Care | Payer: Medicare Other | Attending: Emergency Medicine | Admitting: Emergency Medicine

## 2015-03-10 ENCOUNTER — Encounter: Payer: Self-pay | Admitting: Medical Oncology

## 2015-03-10 DIAGNOSIS — M549 Dorsalgia, unspecified: Secondary | ICD-10-CM | POA: Diagnosis not present

## 2015-03-10 DIAGNOSIS — Z760 Encounter for issue of repeat prescription: Secondary | ICD-10-CM | POA: Diagnosis present

## 2015-03-10 DIAGNOSIS — Z79899 Other long term (current) drug therapy: Secondary | ICD-10-CM | POA: Diagnosis not present

## 2015-03-10 DIAGNOSIS — G8929 Other chronic pain: Secondary | ICD-10-CM

## 2015-03-10 DIAGNOSIS — E119 Type 2 diabetes mellitus without complications: Secondary | ICD-10-CM | POA: Insufficient documentation

## 2015-03-10 DIAGNOSIS — Z7982 Long term (current) use of aspirin: Secondary | ICD-10-CM | POA: Insufficient documentation

## 2015-03-10 DIAGNOSIS — Z7902 Long term (current) use of antithrombotics/antiplatelets: Secondary | ICD-10-CM | POA: Insufficient documentation

## 2015-03-10 DIAGNOSIS — Z794 Long term (current) use of insulin: Secondary | ICD-10-CM | POA: Insufficient documentation

## 2015-03-10 MED ORDER — HYDROCODONE-ACETAMINOPHEN 10-325 MG PO TABS: 1.0000 | ORAL_TABLET | Freq: Two times a day (BID) | ORAL | Status: DC | PRN

## 2015-03-10 NOTE — Discharge Instructions (Signed)
Chronic Back Pain  When back pain lasts longer than 3 months, it is called chronic back pain.People with chronic back pain often go through certain periods that are more intense (flare-ups).  CAUSES Chronic back pain can be caused by wear and tear (degeneration) on different structures in your back. These structures include:  The bones of your spine (vertebrae) and the joints surrounding your spinal cord and nerve roots (facets).  The strong, fibrous tissues that connect your vertebrae (ligaments). Degeneration of these structures may result in pressure on your nerves. This can lead to constant pain. HOME CARE INSTRUCTIONS  Avoid bending, heavy lifting, prolonged sitting, and activities which make the problem worse.  Take brief periods of rest throughout the day to reduce your pain. Lying down or standing usually is better than sitting while you are resting.  Take over-the-counter or prescription medicines only as directed by your caregiver. SEEK IMMEDIATE MEDICAL CARE IF:   You have weakness or numbness in one of your legs or feet.  You have trouble controlling your bladder or bowels.  You have nausea, vomiting, abdominal pain, shortness of breath, or fainting.   This information is not intended to replace advice given to you by your health care provider. Make sure you discuss any questions you have with your health care provider.   Document Released: 06/14/2004 Document Revised: 07/30/2011 Document Reviewed: 10/25/2014 Elsevier Interactive Patient Education 2016 [employer].  You must follow-up with your provider next week as scheduled for further pain management.

## 2015-03-10 NOTE — ED Provider Notes (Signed)
[facility] Emergency Department Provider Note ____________________________________________  Time seen: 1655  I have reviewed the triage vital signs and the nursing notes.  HISTORY  Chief Complaint  Back Pain and Medication Refill  HPI Carrie Gardner is a 64 y.o. female reports to the ED after phone consultation with Dr. Lonzo Candy office today regarding her routine epidural steroid injection and pain medicine refill. Dr. Beola Cord is out of town unexpectedly, and her partner was rescheduled to neck week. She however is unable to get her prescription for her hydrocodone extended through the next week.She was advised to report to the ED for chronic pain medicine refill.  Past Medical History  Diagnosis Date  . Diabetes mellitus without complication (HCC)   . Seizures (HCC)   . TIA (transient ischemic attack)   . Sciatica   . Fibromyalgia   . Vitiligo   . Bradycardia     Patient Active Problem List   Diagnosis Date Noted  . Back pain at L4-L5 level 02/11/2015  . DDD (degenerative disc disease), lumbosacral 02/11/2015  . Lumbar radiculopathy 02/11/2015    Past Surgical History  Procedure Laterality Date  . Spine surgery    . Cholecystectomy    . Abdominal hysterectomy    . Pacemaker insertion    . Laparoscopic gastric banding    . Laparoscopic gastric band removal with laparoscopic gastric sleeve resection      Current Outpatient Rx  Name  Route  Sig  Dispense  Refill  . HYDROcodone-acetaminophen (NORCO) 10-325 MG tablet   Oral   Take 1 tablet by mouth 2 (two) times daily.         Marland Kitchen albuterol (PROVENTIL HFA;VENTOLIN HFA) 108 (90 BASE) MCG/ACT inhaler   Inhalation   Inhale 2 puffs into the lungs every 6 (six) hours as needed for wheezing or shortness of breath.         Marland Kitchen albuterol (PROVENTIL) (2.5 MG/3ML) 0.083% nebulizer solution   Nebulization   Take 2.5 mg by nebulization every 6 (six) hours as needed for wheezing or shortness of  breath.         Marland Kitchen aspirin 81 MG tablet   Oral   Take 81 mg by mouth daily.         Marland Kitchen atorvastatin (LIPITOR) 20 MG tablet   Oral   Take 20 mg by mouth daily.         . clopidogrel (PLAVIX) 75 MG tablet   Oral   Take 75 mg by mouth daily.         . furosemide (LASIX) 20 MG tablet   Oral   Take 20 mg by mouth daily.         Marland Kitchen HYDROcodone-acetaminophen (NORCO) 10-325 MG tablet   Oral   Take 1 tablet by mouth every 12 (twelve) hours as needed.   14 tablet   0   . insulin aspart (NOVOLOG) 100 UNIT/ML injection   Subcutaneous   Inject 20 Units into the skin 3 (three) times daily before meals.         . insulin NPH Human (HUMULIN N,NOVOLIN N) 100 UNIT/ML injection   Subcutaneous   Inject 35 Units into the skin 2 (two) times daily.         Marland Kitchen levothyroxine (SYNTHROID, LEVOTHROID) 112 MCG tablet   Oral   Take 112 mcg by mouth daily before breakfast.         . lisinopril (PRINIVIL,ZESTRIL) 20 MG tablet   Oral  Take 20 mg by mouth daily.         . pramipexole (MIRAPEX) 0.125 MG tablet   Oral   Take 0.125 mg by mouth daily.         Marland Kitchen rOPINIRole (REQUIP) 2 MG tablet   Oral   Take 2 mg by mouth at bedtime.         Marland Kitchen venlafaxine XR (EFFEXOR-XR) 150 MG 24 hr capsule   Oral   Take 150 mg by mouth daily with breakfast.           Allergies Metformin and related; Pregabalin; Ibuprofen; Iodinated diagnostic agents; Tramadol; and Oxycodone  Family History  Problem Relation Age of Onset  . Arthritis Mother   . Cancer Mother   . COPD Mother   . Diabetes Mother   . Vision loss Mother   . Heart disease Mother   . Hypertension Mother   . Kidney disease Mother   . Arthritis Father   . Asthma Father   . Depression Father   . Drug abuse Father   . Vision loss Father   . Hypertension Father   . Stroke Father     Social History Social History  Substance Use Topics  . Smoking status: Never Smoker   . Smokeless tobacco: None  . Alcohol Use: 0.0  oz/week    0 Standard drinks or equivalent per week    Review of Systems  Constitutional: Negative for fever. Eyes: Negative for visual changes. ENT: Negative for sore throat. Cardiovascular: Negative for chest pain. Respiratory: Negative for shortness of breath. Gastrointestinal: Negative for abdominal pain, vomiting and diarrhea. Genitourinary: Negative for dysuria. Musculoskeletal: Positive for chronic back pain. Skin: Negative for rash. Neurological: Negative for headaches, focal weakness or numbness. ____________________________________________  PHYSICAL EXAM:  VITAL SIGNS: ED Triage Vitals  Enc Vitals Group     BP 03/10/15 1629 147/82 mmHg     Pulse Rate 03/10/15 1629 89     Resp 03/10/15 1629 18     Temp 03/10/15 1629 98.3 F (36.8 C)     Temp Source 03/10/15 1629 Oral     SpO2 03/10/15 1629 96 %     Weight 03/10/15 1629 233 lb (105.688 kg)     Height 03/10/15 1629 5\' 2"  (1.575 m)     Head Cir --      Peak Flow --      Pain Score 03/10/15 1629 10     Pain Loc --      Pain Edu? --      Excl. in GC? --     Constitutional: Alert and oriented. Well appearing and in no distress. Head: Normocephalic and atraumatic.      Eyes: Conjunctivae are normal. PERRL. Normal extraocular movements      Ears: Canals clear. TMs intact bilaterally.   Nose: No congestion/rhinorrhea.   Mouth/Throat: Mucous membranes are moist.   Neck: Supple. No thyromegaly. Hematological/Lymphatic/Immunological: No cervical lymphadenopathy. Cardiovascular: Normal rate, regular rhythm.  Respiratory: Normal respiratory effort. No wheezes/rales/rhonchi. Gastrointestinal: Soft and nontender. No distention. Musculoskeletal: Nontender with normal range of motion in all extremities.  Neurologic:  Normal gait without ataxia. Normal speech and language. No gross focal neurologic deficits are appreciated. Skin:  Skin is warm, dry and intact. No rash noted. Psychiatric: Mood and affect are  normal. Patient exhibits appropriate insight and judgment. ___________________________________________  INITIAL IMPRESSION / ASSESSMENT AND PLAN / ED COURSE  The chart review was completed which did confirm an office telephone contact today regarding  the provider been out of town. The [place] controlled substance database was also used to verify the patient's prescription status. She is provided with the perception for hydrocodone 10 mg #14 to dose as directed. She'll follow-up with Dr. Ann Maki on 10/28 as scheduled. ____________________________________________  FINAL CLINICAL IMPRESSION(S) / ED DIAGNOSES  Final diagnoses:  Chronic back pain      Lissa Hoard, PA-C 03/10/15 1754  Rockne Menghini, MD 03/10/15 1820

## 2015-03-10 NOTE — Telephone Encounter (Signed)
Dr. Starling Manns resched appts for 03-11-15 patient is out of meds / what can be done

## 2015-03-10 NOTE — ED Notes (Signed)
Pt ambulatory to triage with reports of lower back pain that is chronic and states that she called Dr Laurena Slimmer office to get med refill on hydrocodone 10mg  but she was told to come to ER.

## 2015-03-11 ENCOUNTER — Ambulatory Visit: Payer: Medicare Other | Admitting: Anesthesiology

## 2015-03-15 NOTE — Telephone Encounter (Signed)
Called and left voice mail that she would need to schedule appointment in order to get her medications refilled.

## 2015-03-17 ENCOUNTER — Ambulatory Visit: Payer: Medicare Other | Admitting: Anesthesiology

## 2015-03-18 ENCOUNTER — Ambulatory Visit: Payer: Medicare Other | Attending: Anesthesiology | Admitting: Anesthesiology

## 2015-03-18 ENCOUNTER — Encounter: Payer: Self-pay | Admitting: Anesthesiology

## 2015-03-18 VITALS — BP 115/72 | HR 84 | Temp 98.2°F | Resp 20 | Ht 62.0 in | Wt 233.0 lb

## 2015-03-18 DIAGNOSIS — G8929 Other chronic pain: Secondary | ICD-10-CM | POA: Diagnosis present

## 2015-03-18 DIAGNOSIS — M5136 Other intervertebral disc degeneration, lumbar region: Secondary | ICD-10-CM | POA: Insufficient documentation

## 2015-03-18 DIAGNOSIS — M545 Low back pain, unspecified: Secondary | ICD-10-CM

## 2015-03-18 DIAGNOSIS — M5416 Radiculopathy, lumbar region: Secondary | ICD-10-CM | POA: Diagnosis not present

## 2015-03-18 DIAGNOSIS — M5116 Intervertebral disc disorders with radiculopathy, lumbar region: Secondary | ICD-10-CM | POA: Diagnosis not present

## 2015-03-18 DIAGNOSIS — M5137 Other intervertebral disc degeneration, lumbosacral region: Secondary | ICD-10-CM

## 2015-03-18 MED ORDER — HYDROCODONE-ACETAMINOPHEN 10-325 MG PO TABS: 1.0000 | ORAL_TABLET | Freq: Two times a day (BID) | ORAL | Status: DC | PRN

## 2015-03-18 MED ORDER — MIDAZOLAM HCL 5 MG/5ML IJ SOLN
INTRAMUSCULAR | Status: AC
Start: 2015-03-18 — End: 2015-03-18
  Administered 2015-03-18: 2 mg via INTRAVENOUS
  Filled 2015-03-18: qty 5

## 2015-03-18 MED ORDER — TRIAMCINOLONE ACETONIDE 40 MG/ML IJ SUSP
INTRAMUSCULAR | Status: AC
  Administered 2015-03-18: 80 mg via EPIDURAL
  Filled 2015-03-18: qty 2

## 2015-03-18 MED ORDER — FENTANYL CITRATE (PF) 100 MCG/2ML IJ SOLN
INTRAMUSCULAR | Status: AC
  Administered 2015-03-18: 50 ug via INTRAVENOUS
  Filled 2015-03-18: qty 2

## 2015-03-18 MED ORDER — BUPIVACAINE HCL (PF) 0.25 % IJ SOLN
INTRAMUSCULAR | Status: AC
  Administered 2015-03-18: 09:00:00
  Filled 2015-03-18: qty 30

## 2015-03-18 NOTE — Patient Instructions (Signed)
Epidural Steroid Injection An epidural steroid injection is given to relieve pain in your neck, back, or legs that is caused by the irritation or swelling of a nerve root. This procedure involves injecting a steroid and numbing medicine (anesthetic) into the epidural space. The epidural space is the space between the outer covering of your spinal cord and the bones that form your backbone (vertebra).  LET YOUR HEALTH CARE PROVIDER KNOW ABOUT:   Any allergies you have.  All medicines you are taking, including vitamins, herbs, eye drops, creams, and over-the-counter medicines such as aspirin.  Previous problems you or members of your family have had with the use of anesthetics.  Any blood disorders or blood clotting disorders you have.  Previous surgeries you have had.  Medical conditions you have. RISKS AND COMPLICATIONS Generally, this is a safe procedure. However, as with any procedure, complications can occur. Possible complications of epidural steroid injection include:  Headache.  Bleeding.  Infection.  Allergic reaction to the medicines.  Damage to your nerves. The response to this procedure depends on the underlying cause of the pain and its duration. People who have long-term (chronic) pain are less likely to benefit from epidural steroids than are those people whose pain comes on strong and suddenly. BEFORE THE PROCEDURE   Ask your health care provider about changing or stopping your regular medicines. You may be advised to stop taking blood-thinning medicines a few days before the procedure.  You may be given medicines to reduce anxiety.  Arrange for someone to take you home after the procedure. PROCEDURE   You will remain awake during the procedure. You may receive medicine to make you relaxed.  You will be asked to lie on your stomach.  The injection site will be cleaned.  The injection site will be numbed with a medicine (local anesthetic).  A needle will be  injected through your skin into the epidural space.  Your health care provider will use an X-ray machine to ensure that the steroid is delivered closest to the affected nerve. You may have minimal discomfort at this time.  Once the needle is in the right position, the local anesthetic and the steroid will be injected into the epidural space.  The needle will then be removed and a bandage will be applied to the injection site. AFTER THE PROCEDURE   You may be monitored for a short time before you go home.  You may feel weakness or numbness in your arm or leg, which disappears within hours.  You may be allowed to eat, drink, and take your regular medicine.  You may have soreness at the site of the injection.   This information is not intended to replace advice given to you by your health care provider. Make sure you discuss any questions you have with your health care provider.   Document Released: 08/14/2007 Document Revised: 01/07/2013 Document Reviewed: 10/24/2012 Elsevier Interactive Patient Education 2016 [employer] Pain Management Discharge Instructions  General Discharge Instructions :  If you need to reach your doctor call: Monday-Friday 8:00 am - 4:00 pm at [phone] or toll free [phone].  After clinic hours [phone] to have operator reach doctor.  Bring all of your medication bottles to all your appointments in the pain clinic.  To cancel or reschedule your appointment with Pain Management please remember to call 24 hours in advance to avoid a fee.  Refer to the educational materials which you have been given on: General Risks, I had my Procedure.   Discharge Instructions, Post Sedation.  Post Procedure Instructions:  The drugs you were given will stay in your system until tomorrow, so for the next 24 hours you should not drive, make any legal decisions or drink any alcoholic beverages.  You may eat anything you prefer, but it is better to start with  liquids then soups and crackers, and gradually work up to solid foods.  Please notify your doctor immediately if you have any unusual bleeding, trouble breathing or pain that is not related to your normal pain.  Depending on the type of procedure that was done, some parts of your body may feel week and/or numb.  This usually clears up by tonight or the next day.  Walk with the use of an assistive device or accompanied by an adult for the 24 hours.  You may use ice on the affected area for the first 24 hours.  Put ice in a Ziploc bag and cover with a towel and place against area 15 minutes on 15 minutes off.  You may switch to heat after 24 hours. 

## 2015-03-18 NOTE — Progress Notes (Signed)
Patients insulin has changed, but she does not know the name of it.  States it is Lisinol 23 units bid, but unable to find it.  Instructed patient to bring meds at next visit.

## 2015-03-18 NOTE — Procedures (Signed)
Date of procedure: 03/18/2015  Preoperative Diagnosis: 1 chronic low back pain 2 lumbar degenerative disc disease 3 lumbar radiculopathy  Postoperative Diagnosis:  Same.  Procedure: 1. Caudal epidural steroid injection, 2. Epidural with interpretation. 3. Fluoroscopic guidance.  Surgeon: Tod Persia, MD  Anesthesia: MAC anesthesia by the nurse and staff under my direction  Informed consent was obtained and the patient appeared to accept and understand the benefits and risks of this procedure.   Pre procedure comments:  Is patient is allergic to contrast media so no Omnipaque was used  Description of the Procedure:  The patient was taken to the operating room and placed in the prone position.  Intravenous sedation and MAC anesthesia was administered by the nurse and staff under my direction. After appropriate sedation, the sacrococcygeal area was prepped with Betadine.  After adequate draping, the area between the sacral cornu was palpated and infiltrated with 3 cc of 1% Lidocaine.   An AP fluoroscopic view of the sacrum was visualized and a 17 gauge Tuohy needle was inserted in the midline at the angle of 45 degrees through the sacrococcygeal membrane.  After making contact with the bone, the needle was withdrawn and readvanced in horizontal position, into the caudal epidural space.  Epidurogram Study:  No Omnipaque 300 was injected through the needle andno  epidurogram was performed because the patient is allergic to contrast  Comments: No  catheter was used   Caudal Epidural Steroid Injection:  Then 10 cc of 0.25% Bupivacaine and 80 mg of Kenalog were injected into the Caudal epidural space.  The needle was removed and adequate hemostasis was established.    The patient tolerated the procedure quite well and vital signs were stable.  There were no adverse effects.  Additional comments:   Because this patient did not stop her aspirin we. Particular attention to her  abdomen side for possible bleeding  The patient was taken to the recovery room in satisfactory condition where the patient was observed and subsequently discharged home.   Will follow up in the clinic in the next week.  Tod Persia M.D.

## 2015-03-21 ENCOUNTER — Telehealth: Payer: Self-pay | Admitting: *Deleted

## 2015-03-21 NOTE — Telephone Encounter (Signed)
Left voicemail to call with questions or concerns.

## 2015-03-24 ENCOUNTER — Telehealth: Payer: Self-pay

## 2015-03-24 NOTE — Telephone Encounter (Signed)
Pt states that she is in serious pain from these injections. She also states that it makes her blood sugar go up. Pt wants Dr. To do something differently but pt does not know what she would like Dr. Starling Manns to do

## 2015-03-28 NOTE — Telephone Encounter (Signed)
Spoke with patient, has appt on Wednesday. Advised patient to speak with Dr. Starling Manns about this on wed.

## 2015-03-30 ENCOUNTER — Encounter: Payer: Self-pay | Admitting: Anesthesiology

## 2015-03-30 ENCOUNTER — Ambulatory Visit: Payer: Medicare Other | Attending: Anesthesiology | Admitting: Anesthesiology

## 2015-03-30 VITALS — BP 141/82 | HR 83 | Temp 97.7°F | Resp 20 | Ht 62.0 in | Wt 220.0 lb

## 2015-03-30 DIAGNOSIS — M5137 Other intervertebral disc degeneration, lumbosacral region: Secondary | ICD-10-CM

## 2015-03-30 DIAGNOSIS — M5116 Intervertebral disc disorders with radiculopathy, lumbar region: Secondary | ICD-10-CM | POA: Diagnosis not present

## 2015-03-30 DIAGNOSIS — M545 Low back pain, unspecified: Secondary | ICD-10-CM

## 2015-03-30 DIAGNOSIS — G8929 Other chronic pain: Secondary | ICD-10-CM | POA: Insufficient documentation

## 2015-03-30 DIAGNOSIS — M5416 Radiculopathy, lumbar region: Secondary | ICD-10-CM

## 2015-03-30 MED ORDER — HYDROCODONE-ACETAMINOPHEN 10-325 MG PO TABS: 1.0000 | ORAL_TABLET | Freq: Two times a day (BID) | ORAL | Status: DC | PRN

## 2015-03-30 NOTE — Progress Notes (Signed)
Safety precautions to be maintained throughout the outpatient stay will include: orient to surroundings, keep bed in low position, maintain call bell within reach at all times, provide assistance with transfer out of bed and ambulation.  

## 2015-03-30 NOTE — Patient Instructions (Signed)
You were given a prescription for Norco today. 

## 2015-04-01 NOTE — Progress Notes (Signed)
   Subjective:    Patient ID: Carrie Gardner, female    DOB: February 01, 1951, 64 y.o.   MRN: [id number]  HPI  This patient returned to the clinic today indicating that she is doing very much better The pain is much improved She also indicated that she used a friend's TENS unit and it was very helpful to her Today her subjective pain intensity rating is 40% She indicates that the medication is also helping her and she is only taking the Norco twice a day Her activities of daily living have improved significantly Fact she looks much better than she is done before and she feels very much better  Review of Systems  Constitutional: Negative.   HENT: Negative.   Eyes: Negative.   Respiratory: Negative.   Cardiovascular: Negative.   Gastrointestinal: Negative.   Endocrine: Negative.   Genitourinary: Negative.   Musculoskeletal: Negative.   Skin: Negative.   Allergic/Immunologic: Negative.   Neurological: Negative.   Hematological: Negative.   Psychiatric/Behavioral: Negative.        Objective:   Physical Exam  Constitutional: She is oriented to person, place, and time. She appears well-developed and well-nourished. No distress.  HENT:  Head: Normocephalic and atraumatic.  Right Ear: External ear normal.  Left Ear: External ear normal.  Nose: Nose normal.  Mouth/Throat: No oropharyngeal exudate.  Eyes: Conjunctivae and EOM are normal. Pupils are equal, round, and reactive to light. Right eye exhibits no discharge. Left eye exhibits no discharge.  Neck: Normal range of motion. Neck supple. No JVD present. No tracheal deviation present. No thyromegaly present.  Cardiovascular: Normal rate, regular rhythm, normal heart sounds and intact distal pulses.  Exam reveals no gallop and no friction rub.   No murmur heard. Pulmonary/Chest: Effort normal and breath sounds normal. No respiratory distress. She has no wheezes. She has no rales. She exhibits no tenderness.  Abdominal: Soft. Bowel  sounds are normal. She exhibits no distension and no mass. There is no tenderness. There is no rebound and no guarding.  Genitourinary:  Genitourinary examination was deferred  Musculoskeletal: Normal range of motion. She exhibits no edema or tenderness.  Lymphadenopathy:    She has no cervical adenopathy.  Neurological: She is alert and oriented to person, place, and time. She has normal reflexes. She displays normal reflexes. No cranial nerve deficit. She exhibits normal muscle tone. Coordination normal.  Skin: Skin is warm and dry. No rash noted. She is not diaphoretic. No erythema. No pallor.  Psychiatric: She has a normal mood and affect. Her behavior is normal. Judgment and thought content normal.  Nursing note and vitals reviewed.         Assessment & Plan:   Assessment 1 chronic low back pain 2 lumbar degenerative disc disease 3  lumbar radiculopathy   Plan of management 1 Will request a TENS unit for this patient from physical therapy 2 Will continue her Norco 10/325 mg 1 tablet every 12 hours and will give her 42 pills 3 Will follow-up with her in 3 weeks   Established patient       level III    Tod Persia M.D.

## 2015-04-05 ENCOUNTER — Ambulatory Visit: Payer: Medicare Other

## 2015-04-18 ENCOUNTER — Ambulatory Visit: Payer: Medicare Other

## 2015-04-20 ENCOUNTER — Encounter: Payer: Self-pay | Admitting: Anesthesiology

## 2015-04-20 ENCOUNTER — Ambulatory Visit: Payer: Medicare Other | Attending: Anesthesiology | Admitting: Anesthesiology

## 2015-04-20 VITALS — BP 152/90 | HR 106 | Temp 97.0°F | Resp 18 | Ht 62.0 in | Wt 226.0 lb

## 2015-04-20 DIAGNOSIS — G8929 Other chronic pain: Secondary | ICD-10-CM | POA: Insufficient documentation

## 2015-04-20 DIAGNOSIS — M545 Low back pain, unspecified: Secondary | ICD-10-CM

## 2015-04-20 DIAGNOSIS — M5137 Other intervertebral disc degeneration, lumbosacral region: Secondary | ICD-10-CM

## 2015-04-20 DIAGNOSIS — M5116 Intervertebral disc disorders with radiculopathy, lumbar region: Secondary | ICD-10-CM | POA: Insufficient documentation

## 2015-04-20 MED ORDER — HYDROCODONE-ACETAMINOPHEN 10-325 MG PO TABS: 1.0000 | ORAL_TABLET | Freq: Two times a day (BID) | ORAL | Status: DC | PRN

## 2015-04-20 NOTE — Patient Instructions (Signed)
You were given a prescription for Hydrocodone today 

## 2015-04-20 NOTE — Progress Notes (Signed)
   Subjective:    Patient ID: Carrie Gardner, female    DOB: 03-01-1951, 64 y.o.   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   HENT: Negative.   Eyes: Negative.   Respiratory: Negative.   Cardiovascular: Negative.   Gastrointestinal: Negative.   Endocrine: Negative.   Genitourinary: Negative.   Musculoskeletal: Negative.   Skin: Negative.   Allergic/Immunologic: Negative.   Neurological: Negative.   Hematological: Negative.   Psychiatric/Behavioral: Negative.        Objective:   Physical Exam  Cardiovascular:  This patient appears to be in no distress A blood pressure is 152/90 mmHg Pulse is 106 bpm Equal and regular Heart sounds 1 and 2 were heard in all areas There were no audible murmurs Temperature was 32F Respirations were 18 breaths per minute SPO2 was 97% Chest is clinically clear There are no adventitious sounds Abdomen is soft and nontender There is no palpable organomegaly There is no significant lymphadenopathy Pupils were equal and reactive Cranial nerves were intact There are no new neurological or musculoskeletal findings  Nursing note and vitals reviewed.         Assessment & Plan:   Assessment 1 chronic low back pain 2 lumbar degenerative disc disease 3 lumbar radiculopathy   Plan of management 1 would recommend that she continue to get the TENS unit 2 Will prescribe Norco 10/325 one tablet every 12 hours and will give her 24 tablets We'll reevaluate her in 2 weeks   Established patient       level III   Tod Persia M.D.

## 2015-04-20 NOTE — Progress Notes (Signed)
Safety precautions to be maintained throughout the outpatient stay will include: orient to surroundings, keep bed in low position, maintain call bell within reach at all times, provide assistance with transfer out of bed and ambulation.  

## 2015-05-02 ENCOUNTER — Other Ambulatory Visit: Payer: Self-pay | Admitting: Anesthesiology

## 2015-05-02 ENCOUNTER — Ambulatory Visit: Payer: Medicare Other | Attending: Anesthesiology | Admitting: Anesthesiology

## 2015-05-02 ENCOUNTER — Encounter: Payer: Self-pay | Admitting: Anesthesiology

## 2015-05-02 VITALS — BP 168/83 | HR 92 | Temp 97.8°F | Resp 16 | Ht 62.0 in | Wt 228.0 lb

## 2015-05-02 DIAGNOSIS — G2581 Restless legs syndrome: Secondary | ICD-10-CM | POA: Insufficient documentation

## 2015-05-02 DIAGNOSIS — M5137 Other intervertebral disc degeneration, lumbosacral region: Secondary | ICD-10-CM

## 2015-05-02 DIAGNOSIS — M5136 Other intervertebral disc degeneration, lumbar region: Secondary | ICD-10-CM | POA: Insufficient documentation

## 2015-05-02 DIAGNOSIS — M545 Low back pain, unspecified: Secondary | ICD-10-CM

## 2015-05-02 DIAGNOSIS — G8929 Other chronic pain: Secondary | ICD-10-CM | POA: Insufficient documentation

## 2015-05-02 MED ORDER — HYDROCODONE-ACETAMINOPHEN 10-325 MG PO TABS: 1.0000 | ORAL_TABLET | Freq: Two times a day (BID) | ORAL | Status: DC | PRN

## 2015-05-02 NOTE — Progress Notes (Signed)
Safety precautions to be maintained throughout the outpatient stay will include: orient to surroundings, keep bed in low position, maintain call bell within reach at all times, provide assistance with transfer out of bed and ambulation.  

## 2015-05-02 NOTE — Progress Notes (Signed)
   Subjective:    Patient ID: Carrie Gardner, female    DOB: 03/10/1951, 64 y.o.   MRN: [id number]  HPI This patient returned to the clinic today indicating that her major problem is the involuntary movements of her legs caused by her restless leg syndrome Her doctor is about to renew her prescription of Requip grahams which she takes for restless legs syndrome She indicated that the caudal epidural steroid injection gave her modest pain relief from her back pain and that her subjective pain intensity rating is currently 60% He is otherwise doing fine  Review of Systems  Constitutional: Negative.   HENT: Negative.   Eyes: Negative.   Respiratory: Negative.   Cardiovascular: Negative.   Gastrointestinal: Negative.   Endocrine: Negative.   Genitourinary: Negative.   Musculoskeletal: Negative.   Skin: Negative.   Allergic/Immunologic: Negative.   Neurological: Negative.        He has restless leg syndrome and is being treated with Requip  Hematological: Negative.   Psychiatric/Behavioral: Negative.        Objective:   Physical Exam  Cardiovascular:  Patient does not look in any distress and is in fact looking much more comfortable than she was before Blood pressure is 168/83 mmHg Her pulse is 92 bpm Equal and regular Heart Sounds 1 and 2 were heard in all areas There were no audible murmurs Temperature was 97.78F Respirations were 16 breaths per minute SPO2 was 100% Chest is clinically clear There are no adventitious sounds  Abdomen is soft and nontender No palpable organomegaly No significant lymphadenopathy Pupils were equal and reactive Cranial Nerves were intact There were no new neurological or musculoskeletal findings  Nursing note and vitals reviewed.         Assessment & Plan:   Assessment 1 chronic low back pain 2 lumbar degenerative disc disease 3 restless leg syndrome   Plan of management We will have her renew her Requip prescription 2 Will  continue her on Norco 10/325 -1 tablet every 12 hours and give her 50 tablets 3 Will follow up with her in 1 month   Established patient    level III   Tod Persia M.D.

## 2015-05-07 LAB — TOXASSURE SELECT 13 (MW), URINE: PDF: 0

## 2015-06-01 ENCOUNTER — Ambulatory Visit: Payer: Medicare Other | Attending: Anesthesiology | Admitting: Anesthesiology

## 2015-06-01 ENCOUNTER — Encounter: Payer: Self-pay | Admitting: Anesthesiology

## 2015-06-01 VITALS — BP 114/93 | HR 96 | Temp 98.3°F | Resp 18 | Ht 62.0 in | Wt 227.0 lb

## 2015-06-01 DIAGNOSIS — M5116 Intervertebral disc disorders with radiculopathy, lumbar region: Secondary | ICD-10-CM | POA: Diagnosis not present

## 2015-06-01 DIAGNOSIS — G8929 Other chronic pain: Secondary | ICD-10-CM | POA: Insufficient documentation

## 2015-06-01 DIAGNOSIS — G2581 Restless legs syndrome: Secondary | ICD-10-CM | POA: Diagnosis not present

## 2015-06-01 DIAGNOSIS — M545 Low back pain, unspecified: Secondary | ICD-10-CM

## 2015-06-01 DIAGNOSIS — M5416 Radiculopathy, lumbar region: Secondary | ICD-10-CM

## 2015-06-01 DIAGNOSIS — M5137 Other intervertebral disc degeneration, lumbosacral region: Secondary | ICD-10-CM

## 2015-06-01 DIAGNOSIS — F1190 Opioid use, unspecified, uncomplicated: Secondary | ICD-10-CM | POA: Diagnosis not present

## 2015-06-01 MED ORDER — HYDROCODONE-ACETAMINOPHEN 10-325 MG PO TABS: 1.0000 | ORAL_TABLET | Freq: Two times a day (BID) | ORAL | Status: DC

## 2015-06-01 NOTE — Progress Notes (Signed)
Safety precautions to be maintained throughout the outpatient stay will include: orient to surroundings, keep bed in low position, maintain call bell within reach at all times, provide assistance with transfer out of bed and ambulation.  

## 2015-06-01 NOTE — Patient Instructions (Signed)
You were given a prescription for Norco today. Epidural Steroid Injection Patient Information  Description: The epidural space surrounds the nerves as they exit the spinal cord.  In some patients, the nerves can be compressed and inflamed by a bulging disc or a tight spinal canal (spinal stenosis).  By injecting steroids into the epidural space, we can bring irritated nerves into direct contact with a potentially helpful medication.  These steroids act directly on the irritated nerves and can reduce swelling and inflammation which often leads to decreased pain.  Epidural steroids may be injected anywhere along the spine and from the neck to the low back depending upon the location of your pain.   After numbing the skin with local anesthetic (like Novocaine), a small needle is passed into the epidural space slowly.  You may experience a sensation of pressure while this is being done.  The entire block usually last less than 10 minutes.  Conditions which may be treated by epidural steroids:   Low back and leg pain  Neck and arm pain  Spinal stenosis  Post-laminectomy syndrome  Herpes zoster (shingles) pain  Pain from compression fractures  Preparation for the injection:  1. Do not eat any solid food or dairy products within 6 hours of your appointment.  2. You may drink clear liquids up to 2 hours before appointment.  Clear liquids include water, black coffee, juice or soda.  No milk or cream please. 3. You may take your regular medication, including pain medications, with a sip of water before your appointment  Diabetics should hold regular insulin (if taken separately) and take 1/2 normal NPH dos the morning of the procedure.  Carry some sugar containing items with you to your appointment. 4. A driver must accompany you and be prepared to drive you home after your procedure.  5. Bring all your current medications with your. 6. An IV may be inserted and sedation may be given at the  discretion of the physician.   7. A blood pressure cuff, EKG and other monitors will often be applied during the procedure.  Some patients may need to have extra oxygen administered for a short period. 8. You will be asked to provide medical information, including your allergies, prior to the procedure.  We must know immediately if you are taking blood thinners (like Coumadin/Warfarin)  Or if you are allergic to IV iodine contrast (dye). We must know if you could possible be pregnant.  Possible side-effects:  Bleeding from needle site  Infection (rare, may require surgery)  Nerve injury (rare)  Numbness & tingling (temporary)  Difficulty urinating (rare, temporary)  Spinal headache ( a headache worse with upright posture)  Light -headedness (temporary)  Pain at injection site (several days)  Decreased blood pressure (temporary)  Weakness in arm/leg (temporary)  Pressure sensation in back/neck (temporary)  Call if you experience:  Fever/chills associated with headache or increased back/neck pain.  Headache worsened by an upright position.  New onset weakness or numbness of an extremity below the injection site  Hives or difficulty breathing (go to the emergency room)  Inflammation or drainage at the infection site  Severe back/neck pain  Any new symptoms which are concerning to you  Please note:  Although the local anesthetic injected can often make your back or neck feel good for several hours after the injection, the pain will likely return.  It takes 3-7 days for steroids to work in the epidural space.  You may not notice any pain  relief for at least that one week.  If effective, we will often do a series of three injections spaced 3-6 weeks apart to maximally decrease your pain.  After the initial series, we generally will wait several months before considering a repeat injection of the same type.  If you have any questions, please call [phone] [facility]

## 2015-06-03 ENCOUNTER — Other Ambulatory Visit: Payer: Self-pay | Admitting: Pain Medicine

## 2015-06-03 ENCOUNTER — Telehealth: Payer: Self-pay | Admitting: *Deleted

## 2015-06-03 ENCOUNTER — Encounter: Payer: Self-pay | Admitting: Pain Medicine

## 2015-06-03 DIAGNOSIS — F1190 Opioid use, unspecified, uncomplicated: Secondary | ICD-10-CM

## 2015-06-03 DIAGNOSIS — G8929 Other chronic pain: Secondary | ICD-10-CM

## 2015-06-03 DIAGNOSIS — Z79891 Long term (current) use of opiate analgesic: Secondary | ICD-10-CM | POA: Insufficient documentation

## 2015-06-03 HISTORY — DX: Opioid use, unspecified, uncomplicated: F11.90

## 2015-06-03 MED ORDER — HYDROCODONE-ACETAMINOPHEN 10-325 MG PO TABS: 1.0000 | ORAL_TABLET | Freq: Two times a day (BID) | ORAL | Status: DC
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
## 2015-06-29 ENCOUNTER — Ambulatory Visit: Payer: Medicare Other | Attending: Anesthesiology | Admitting: Anesthesiology

## 2015-06-29 ENCOUNTER — Encounter: Payer: Self-pay | Admitting: Anesthesiology

## 2015-06-29 VITALS — BP 112/67 | HR 90 | Resp 16

## 2015-06-29 DIAGNOSIS — M5416 Radiculopathy, lumbar region: Secondary | ICD-10-CM

## 2015-06-29 DIAGNOSIS — I10 Essential (primary) hypertension: Secondary | ICD-10-CM | POA: Diagnosis not present

## 2015-06-29 DIAGNOSIS — M5116 Intervertebral disc disorders with radiculopathy, lumbar region: Secondary | ICD-10-CM | POA: Diagnosis not present

## 2015-06-29 DIAGNOSIS — E119 Type 2 diabetes mellitus without complications: Secondary | ICD-10-CM | POA: Diagnosis not present

## 2015-06-29 DIAGNOSIS — M5137 Other intervertebral disc degeneration, lumbosacral region: Secondary | ICD-10-CM | POA: Insufficient documentation

## 2015-06-29 DIAGNOSIS — M545 Low back pain, unspecified: Secondary | ICD-10-CM

## 2015-06-29 DIAGNOSIS — G8929 Other chronic pain: Secondary | ICD-10-CM | POA: Insufficient documentation

## 2015-06-29 MED ORDER — MIDAZOLAM HCL 5 MG/5ML IJ SOLN: 1.0000 mg | INTRAMUSCULAR | Status: DC

## 2015-06-29 MED ORDER — FENTANYL CITRATE (PF) 100 MCG/2ML IJ SOLN
INTRAMUSCULAR | Status: AC
  Administered 2015-06-29: 50 ug
  Filled 2015-06-29: qty 2

## 2015-06-29 MED ORDER — HYDROCODONE-ACETAMINOPHEN 10-325 MG PO TABS: 1.0000 | ORAL_TABLET | Freq: Two times a day (BID) | ORAL | Status: DC

## 2015-06-29 MED ORDER — FENTANYL CITRATE (PF) 100 MCG/2ML IJ SOLN: 50.0000 ug | INTRAMUSCULAR | Status: DC

## 2015-06-29 MED ORDER — BUPIVACAINE HCL (PF) 0.25 % IJ SOLN
INTRAMUSCULAR | Status: AC
  Administered 2015-06-29: 12:00:00
  Filled 2015-06-29: qty 30

## 2015-06-29 MED ORDER — TRIAMCINOLONE ACETONIDE 40 MG/ML IJ SUSP: 80.0000 mg | Freq: Once | INTRAMUSCULAR | Status: DC

## 2015-06-29 MED ORDER — TRIAMCINOLONE ACETONIDE 40 MG/ML IJ SUSP
INTRAMUSCULAR | Status: AC
  Administered 2015-06-29: 12:00:00
  Filled 2015-06-29: qty 2

## 2015-06-29 MED ORDER — MIDAZOLAM HCL 5 MG/5ML IJ SOLN
INTRAMUSCULAR | Status: AC
  Administered 2015-06-29: 2 mg via INTRAVENOUS
  Filled 2015-06-29: qty 5

## 2015-06-29 MED ORDER — BUPIVACAINE HCL (PF) 0.25 % IJ SOLN: 20.0000 mL | Freq: Once | INTRAMUSCULAR | Status: DC

## 2015-06-29 NOTE — Progress Notes (Signed)
   Subjective:    Patient ID: Carrie Gardner, female    DOB: 05-24-1950, 65 y.o.   MRN: [id number]  HPI    Review of Systems     Objective:   Physical Exam        Assessment & Plan:    Patient was given hydrocodone 10/325 twice a day and she was given 60 tablets for one month  Tod Persia M.D.

## 2015-06-29 NOTE — Procedures (Signed)
Date of procedure: 06/29/2015  Preoperative Diagnosis:  1 chronic low back pain 2 lumbar degenerative disc disease 3 lumbar radiculopathy 4 status post diabetes mellitus 5 status post hypertension  Postoperative Diagnosis: Same.  Procedure: 1. Caudal epidural steroid injection, 2. Epidural with interpretation. 3. Fluoroscopic guidance.  Surgeon: Tod Persia, MD  Anesthesia: MAC anesthesia by the nursing staff under my direction  Informed consent was obtained and the patient appeared to accept and understand the benefits and risks of this procedure.   Pre procedure comments:  Patient is allergic to iodine related contrast media and as a consequence of Omnipaque would not be used during this procedure  Description of the Procedure:  The patient was taken to the operating room and placed in the prone position.   Intravenous sedation and MAC anesthesia was administered by the nurse and staff under my direction. After appropriate sedation, the sacrococcygeal area was prepped with Betadine.   After adequate draping, the area between the sacral cornu was palpated and infiltrated with 3 cc of 1% Lidocaine.   An AP fluoroscopic view of the sacrum was visualized and a 17 gauge Tuohy needle was inserted in the midline at the angle of 45 degrees through the sacrococcygeal membrane.   After making contact with the bone, the needle was withdrawn and readvanced in horizontal position, into the caudal epidural space.  Epidurogram Study: The contrast media was used in this case because patient is allergic to iodinated dye There is no epidurogram study was done  Comments:   No catheter was used   Caudal Epidural Steroid Injection:  Then 15 cc of 0.25% Bupivacaine and 12 mg of Celestone were injected into the Caudal epidural space.   The needle was removed and adequate hemostasis was established.    The patient tolerated the procedure quite well and vital signs were stable.   ere were  no adverse effects.  Additional comments:   This procedure was done under fluoroscopic guidance but no contrast was used Fluoroscopic time was 6 seconds There were 4 fluoroscopic frames MG Y was 5.3  The patient was taken to the recovery room in satisfactory condition where the patient was observed and subsequently discharged home.   Will follow up in the clinic in the next week.    Tod Persia M.D.

## 2015-06-29 NOTE — Patient Instructions (Signed)

## 2015-06-29 NOTE — Progress Notes (Signed)
Safety precautions to be maintained throughout the outpatient stay will include: orient to surroundings, keep bed in low position, maintain call bell within reach at all times, provide assistance with transfer out of bed and ambulation.  

## 2015-06-29 NOTE — Addendum Note (Signed)
Addended by: Tod Persia on: 06/29/2015 12:26 PM   Modules accepted: Orders

## 2015-06-30 ENCOUNTER — Telehealth: Payer: Self-pay | Admitting: *Deleted

## 2015-06-30 NOTE — Telephone Encounter (Signed)
Left voicemail for patient to call our office if she has any questions or concerns re;procedure on yesterday. 

## 2015-07-26 ENCOUNTER — Encounter: Payer: Self-pay | Admitting: Anesthesiology

## 2015-07-26 ENCOUNTER — Ambulatory Visit: Payer: Medicare Other | Attending: Anesthesiology | Admitting: Anesthesiology

## 2015-07-26 VITALS — BP 150/97 | HR 94 | Temp 98.2°F | Resp 16 | Ht 63.0 in | Wt 218.0 lb

## 2015-07-26 DIAGNOSIS — E119 Type 2 diabetes mellitus without complications: Secondary | ICD-10-CM | POA: Insufficient documentation

## 2015-07-26 DIAGNOSIS — M5116 Intervertebral disc disorders with radiculopathy, lumbar region: Secondary | ICD-10-CM | POA: Insufficient documentation

## 2015-07-26 DIAGNOSIS — G2581 Restless legs syndrome: Secondary | ICD-10-CM | POA: Insufficient documentation

## 2015-07-26 DIAGNOSIS — M5137 Other intervertebral disc degeneration, lumbosacral region: Secondary | ICD-10-CM

## 2015-07-26 DIAGNOSIS — G8929 Other chronic pain: Secondary | ICD-10-CM

## 2015-07-26 DIAGNOSIS — M545 Low back pain, unspecified: Secondary | ICD-10-CM

## 2015-07-26 DIAGNOSIS — M797 Fibromyalgia: Secondary | ICD-10-CM | POA: Diagnosis not present

## 2015-07-26 DIAGNOSIS — M5416 Radiculopathy, lumbar region: Secondary | ICD-10-CM

## 2015-07-26 MED ORDER — HYDROCODONE-ACETAMINOPHEN 10-325 MG PO TABS: 1.0000 | ORAL_TABLET | Freq: Two times a day (BID) | ORAL | Status: DC | PRN

## 2015-07-26 NOTE — Patient Instructions (Addendum)
Epidural Steroid Injection Patient Information  Description: The epidural space surrounds the nerves as they exit the spinal cord.  In some patients, the nerves can be compressed and inflamed by a bulging disc or a tight spinal canal (spinal stenosis).  By injecting steroids into the epidural space, we can bring irritated nerves into direct contact with a potentially helpful medication.  These steroids act directly on the irritated nerves and can reduce swelling and inflammation which often leads to decreased pain.  Epidural steroids may be injected anywhere along the spine and from the neck to the low back depending upon the location of your pain.   After numbing the skin with local anesthetic (like Novocaine), a small needle is passed into the epidural space slowly.  You may experience a sensation of pressure while this is being done.  The entire block usually last less than 10 minutes.  Conditions which may be treated by epidural steroids:   Low back and leg pain  Neck and arm pain  Spinal stenosis  Post-laminectomy syndrome  Herpes zoster (shingles) pain  Pain from compression fractures  Preparation for the injection:  1. Do not eat any solid food or dairy products within 8 hours of your appointment.  2. You may drink clear liquids up to 3 hours before appointment.  Clear liquids include water, black coffee, juice or soda.  No milk or cream please. 3. You may take your regular medication, including pain medications, with a sip of water before your appointment  Diabetics should hold regular insulin (if taken separately) and take 1/2 normal NPH dos the morning of the procedure.  Carry some sugar containing items with you to your appointment. 4. A driver must accompany you and be prepared to drive you home after your procedure.  5. Bring all your current medications with your. 6. An IV may be inserted and sedation may be given at the discretion of the physician.   7. A blood pressure  cuff, EKG and other monitors will often be applied during the procedure.  Some patients may need to have extra oxygen administered for a short period. 8. You will be asked to provide medical information, including your allergies, prior to the procedure.  We must know immediately if you are taking blood thinners (like Coumadin/Warfarin)  Or if you are allergic to IV iodine contrast (dye). We must know if you could possible be pregnant.  Possible side-effects:  Bleeding from needle site  Infection (rare, may require surgery)  Nerve injury (rare)  Numbness & tingling (temporary)  Difficulty urinating (rare, temporary)  Spinal headache ( a headache worse with upright posture)  Light -headedness (temporary)  Pain at injection site (several days)  Decreased blood pressure (temporary)  Weakness in arm/leg (temporary)  Pressure sensation in back/neck (temporary)  Call if you experience:  Fever/chills associated with headache or increased back/neck pain.  Headache worsened by an upright position.  New onset weakness or numbness of an extremity below the injection site  Hives or difficulty breathing (go to the emergency room)  Inflammation or drainage at the infection site  Severe back/neck pain  Any new symptoms which are concerning to you  Please note:  Although the local anesthetic injected can often make your back or neck feel good for several hours after the injection, the pain will likely return.  It takes 3-7 days for steroids to work in the epidural space.  You may not notice any pain relief for at least that one week.    If effective, we will often do a series of three injections spaced 3-6 weeks apart to maximally decrease your pain.  After the initial series, we generally will wait several months before considering a repeat injection of the same type.  If you have any questions, please call [phone] [facility] You were given a  prescripton for Hydrocodone today.

## 2015-07-26 NOTE — Progress Notes (Signed)
Safety precautions to be maintained throughout the outpatient stay will include: orient to surroundings, keep bed in low position, maintain call bell within reach at all times, provide assistance with transfer out of bed and ambulation.  

## 2015-07-27 ENCOUNTER — Ambulatory Visit: Payer: Medicare Other | Admitting: Anesthesiology

## 2015-07-27 NOTE — Progress Notes (Signed)
   Subjective:    Patient ID: Carrie Gardner, female    DOB: [birth year], 65 y.o.   MRN: [id number]  HPI  This patient returned to the clinic today indicating that she was doing quite well She indicates that the caudal epidural steroid injection not only helps her back but significantly helps her chronic restless leg syndrome He is very pleased with the results of the procedure but indicated that her blood sugar gets very elevated following the procedure I told her that happens in some patients who are diabetic and that I would take steps to decrease the possibility in the future Since she had the procedure last month out deferred the procedure for this month especially since her pain is well controlled Her subjective pain intensity rating today is 30%  Review of Systems  Constitutional: Negative.   HENT: Negative.   Eyes: Negative.   Respiratory: Negative.   Cardiovascular: Negative.   Gastrointestinal: Negative.   Endocrine:       Patient is diabetic and she sustained elevated blood sugar levels following the caudal epidural steroid injection She is treated successfully by manipulating her antidiabetic medications We will withhold a caudal epidural steroid injection this months  Genitourinary: Negative.   Musculoskeletal: Negative.   Skin: Negative.   Allergic/Immunologic: Negative.   Neurological: Negative.   Hematological: Negative.   Psychiatric/Behavioral: Negative.        Objective:   Physical Exam  Cardiovascular:  Patient appears to be in no distress and is in fact quite emphatic about her decreasing pain and a decrease in the symptoms of her restless leg syndrome Blood pressure is 157/97 mmHg Pulse is 94 bpm Equal and regular Heart sounds 1 and 2 were heard in all areas There were no audible murmurs Temperature is 98.35F Respirations are 16 breaths per minute  SPO2 was 100 Chest is clinically clear There no adventitious sounds Abdomen is soft and  nontender There Is no palpable organomegaly There is no significant lymphadenopathy Pupils are equal and reactive Cranial nerves are intact There are no new neurological or musculoskeletal findings   Nursing note and vitals reviewed.         Assessment & Plan:    Assessment 1 Chronic low back pain 2 lumbar degenerative disc disease 3 lumbar radiculopathy 4 restless leg syndrome 5 fibromyalgia   Plan of management 1 we will defer caudal epidural steroid injections this month and will perform in the next month that would decrease the level of steroids to 40 mg of Kenalog 2 Will continue her on Norco 10/325 one to 2 tablets twice a day on a when necessary basis and give her 45 tablets We will follow-up with her in 1 month   Established patient   level III   Tod Persia M.D.

## 2015-08-19 ENCOUNTER — Telehealth: Payer: Self-pay | Admitting: *Deleted

## 2015-08-24 ENCOUNTER — Encounter: Payer: Self-pay | Admitting: Anesthesiology

## 2015-08-24 ENCOUNTER — Ambulatory Visit: Payer: Medicare Other | Attending: Anesthesiology | Admitting: Anesthesiology

## 2015-08-24 VITALS — BP 110/55 | HR 91 | Temp 97.8°F | Resp 16 | Ht 63.0 in | Wt 222.0 lb

## 2015-08-24 DIAGNOSIS — M5137 Other intervertebral disc degeneration, lumbosacral region: Secondary | ICD-10-CM

## 2015-08-24 DIAGNOSIS — M5116 Intervertebral disc disorders with radiculopathy, lumbar region: Secondary | ICD-10-CM | POA: Insufficient documentation

## 2015-08-24 DIAGNOSIS — G8929 Other chronic pain: Secondary | ICD-10-CM | POA: Insufficient documentation

## 2015-08-24 DIAGNOSIS — M545 Low back pain, unspecified: Secondary | ICD-10-CM

## 2015-08-24 DIAGNOSIS — M797 Fibromyalgia: Secondary | ICD-10-CM

## 2015-08-24 DIAGNOSIS — M5416 Radiculopathy, lumbar region: Secondary | ICD-10-CM

## 2015-08-24 MED ORDER — TRIAMCINOLONE ACETONIDE 40 MG/ML IJ SUSP
INTRAMUSCULAR | Status: AC
  Administered 2015-08-24: 09:00:00
  Filled 2015-08-24: qty 1

## 2015-08-24 MED ORDER — FENTANYL CITRATE (PF) 100 MCG/2ML IJ SOLN: 50.0000 ug | INTRAMUSCULAR | Status: DC

## 2015-08-24 MED ORDER — MIDAZOLAM HCL 5 MG/5ML IJ SOLN: 1.0000 mg | INTRAMUSCULAR | Status: DC

## 2015-08-24 MED ORDER — MIDAZOLAM HCL 5 MG/5ML IJ SOLN
INTRAMUSCULAR | Status: AC
Start: 2015-08-24 — End: 2015-08-24
  Administered 2015-08-24: 2 mg via INTRAVENOUS
  Filled 2015-08-24: qty 5

## 2015-08-24 MED ORDER — FENTANYL CITRATE (PF) 100 MCG/2ML IJ SOLN
INTRAMUSCULAR | Status: AC
  Filled 2015-08-24: qty 2

## 2015-08-24 MED ORDER — BUPIVACAINE HCL (PF) 0.25 % IJ SOLN
INTRAMUSCULAR | Status: AC
  Administered 2015-08-24: 09:00:00
  Filled 2015-08-24: qty 30

## 2015-08-24 MED ORDER — HYDROCODONE-ACETAMINOPHEN 10-325 MG PO TABS: 1.0000 | ORAL_TABLET | Freq: Two times a day (BID) | ORAL | Status: DC | PRN

## 2015-08-24 MED ORDER — SODIUM CHLORIDE 0.9 % IJ SOLN
INTRAMUSCULAR | Status: AC
  Filled 2015-08-24: qty 10

## 2015-08-24 NOTE — Procedures (Signed)
Date of procedure: 08/24/2015  Preoperative Diagnosis:  1 chronic low back pain 2 lumbar degenerative disc disease 3 lumbar radiculopathy 4 fibromyalgia  Postoperative Diagnosis:  Same.  Procedure: 1. Caudal epidural steroid injection, 2. Epidural with interpretation. 3. Fluoroscopic guidance.  Surgeon: Tod Persia, MD  Anesthesia: MAC anesthesia by the nurse and staff under my direction   Informed consent was obtained and the patient appeared to accept and understand the benefits and risks of this procedure.   Pre procedure comments:  None  Description of the Procedure:  The patient was taken to the operating room and placed in the prone position.  Intravenous sedation and MAC anesthesia was administered by the nurse and staff under my direction. After appropriate sedation, the sacrococcygeal area was prepped with Betadine.   After adequate draping, the area between the sacral cornu was palpated and infiltrated with 3 cc of 1% Lidocaine.   An AP fluoroscopic view of the sacrum was visualized and a 17 gauge Tuohy needle was inserted in the midline at the angle of 45 degrees through the sacrococcygeal membrane.   After making contact with the bone, the needle was withdrawn and readvanced in horizontal position, into the caudal epidural space. The needle positioning was confirmed using lateral fluoroscopic views to verify that the end of the Tuohy needle was in the caudal epidural space  Epidurogram Study: No Isovue or contrast was used in the performance of this procedure because the patient was allergic to iodinated drugs There is no epidurogram study was performed   Comments:   This procedure was done using fluoroscopic guidance Fluoroscopic time was 0.1 minute Number of fluoroscopic frames was 1 MG Y was 4.6 No catheter was used.  Caudal Epidural Steroid Injection:  Then 10 cc of 0.25% Bupivacaine and 40 mg of Kenalog were injected into the Caudal epidural  space.   40 mg of Kenalog was used instead of 80 because the patient was diabetic; this was to minimize the potential for postprocedure hyperglycemia The needle was removed and adequate hemostasis was established.    The patient tolerated the procedure quite well and vital signs were stable.   There were no adverse effects.  Additional comments:  The patient was given Norco 10/325 2 tablets per day when necessary and she was given 45 tablets  The patient was taken to the recovery room in satisfactory condition where the patient was observed and subsequently discharged home.   Will follow up in the clinic in the next month.    Tod Persia M.D.

## 2015-08-24 NOTE — Progress Notes (Signed)
Safety precautions to be maintained throughout the outpatient stay will include: orient to surroundings, keep bed in low position, maintain call bell within reach at all times, provide assistance with transfer out of bed and ambulation.  

## 2015-08-24 NOTE — Patient Instructions (Signed)
Pain Management Discharge Instructions  General Discharge Instructions :  If you need to reach your doctor call: Monday-Friday 8:00 am - 4:00 pm at [phone] or toll free [phone].  After clinic hours [phone] to have operator reach doctor.  Bring all of your medication bottles to all your appointments in the pain clinic.  To cancel or reschedule your appointment with Pain Management please remember to call 24 hours in advance to avoid a fee.  Refer to the educational materials which you have been given on: General Risks, I had my Procedure. Discharge Instructions, Post Sedation.  Post Procedure Instructions:  The drugs you were given will stay in your system until tomorrow, so for the next 24 hours you should not drive, make any legal decisions or drink any alcoholic beverages.  You may eat anything you prefer, but it is better to start with liquids then soups and crackers, and gradually work up to solid foods.  Please notify your doctor immediately if you have any unusual bleeding, trouble breathing or pain that is not related to your normal pain.  Depending on the type of procedure that was done, some parts of your body may feel week and/or numb.  This usually clears up by tonight or the next day.  Walk with the use of an assistive device or accompanied by an adult for the 24 hours.  You may use ice on the affected area for the first 24 hours.  Put ice in a Ziploc bag and cover with a towel and place against area 15 minutes on 15 minutes off.  You may switch to heat after 24 hours.Epidural Steroid Injection An epidural steroid injection is given to relieve pain in your neck, back, or legs that is caused by the irritation or swelling of a nerve root. This procedure involves injecting a steroid and numbing medicine (anesthetic) into the epidural space. The epidural space is the space between the outer covering of your spinal cord and the bones that form your backbone  (vertebra).  LET YOUR HEALTH CARE PROVIDER KNOW ABOUT:   Any allergies you have.  All medicines you are taking, including vitamins, herbs, eye drops, creams, and over-the-counter medicines such as aspirin.  Previous problems you or members of your family have had with the use of anesthetics.  Any blood disorders or blood clotting disorders you have.  Previous surgeries you have had.  Medical conditions you have. RISKS AND COMPLICATIONS Generally, this is a safe procedure. However, as with any procedure, complications can occur. Possible complications of epidural steroid injection include:  Headache.  Bleeding.  Infection.  Allergic reaction to the medicines.  Damage to your nerves. The response to this procedure depends on the underlying cause of the pain and its duration. People who have long-term (chronic) pain are less likely to benefit from epidural steroids than are those people whose pain comes on strong and suddenly. BEFORE THE PROCEDURE   Ask your health care provider about changing or stopping your regular medicines. You may be advised to stop taking blood-thinning medicines a few days before the procedure.  You may be given medicines to reduce anxiety.  Arrange for someone to take you home after the procedure. PROCEDURE   You will remain awake during the procedure. You may receive medicine to make you relaxed.  You will be asked to lie on your stomach.  The injection site will be cleaned.  The injection site will be numbed with a medicine (local anesthetic).  A needle will be   injected through your skin into the epidural space.  Your health care provider will use an X-ray machine to ensure that the steroid is delivered closest to the affected nerve. You may have minimal discomfort at this time.  Once the needle is in the right position, the local anesthetic and the steroid will be injected into the epidural space.  The needle will then be removed and a  bandage will be applied to the injection site. AFTER THE PROCEDURE   You may be monitored for a short time before you go home.  You may feel weakness or numbness in your arm or leg, which disappears within hours.  You may be allowed to eat, drink, and take your regular medicine.  You may have soreness at the site of the injection.   This information is not intended to replace advice given to you by your health care provider. Make sure you discuss any questions you have with your health care provider.   Document Released: 08/14/2007 Document Revised: 01/07/2013 Document Reviewed: 10/24/2012 Elsevier Interactive Patient Education 2016 [employer]  

## 2015-08-25 ENCOUNTER — Telehealth: Payer: Self-pay | Admitting: *Deleted

## 2015-08-25 NOTE — Telephone Encounter (Signed)
Left voicemail with patient to call our office if there are any questions or concerns re; procedure on yesterday. 

## 2015-09-01 LAB — TOXASSURE SELECT 13 (MW), URINE: PDF: 0

## 2015-09-23 ENCOUNTER — Ambulatory Visit: Payer: Medicare Other | Attending: Anesthesiology | Admitting: Anesthesiology

## 2015-09-23 ENCOUNTER — Encounter: Payer: Self-pay | Admitting: Anesthesiology

## 2015-09-23 VITALS — BP 162/74 | HR 93 | Temp 98.0°F | Resp 18 | Ht 63.0 in | Wt 229.0 lb

## 2015-09-23 DIAGNOSIS — G2581 Restless legs syndrome: Secondary | ICD-10-CM

## 2015-09-23 DIAGNOSIS — G8929 Other chronic pain: Secondary | ICD-10-CM | POA: Insufficient documentation

## 2015-09-23 DIAGNOSIS — M545 Low back pain, unspecified: Secondary | ICD-10-CM

## 2015-09-23 DIAGNOSIS — M5137 Other intervertebral disc degeneration, lumbosacral region: Secondary | ICD-10-CM

## 2015-09-23 DIAGNOSIS — M5116 Intervertebral disc disorders with radiculopathy, lumbar region: Secondary | ICD-10-CM | POA: Insufficient documentation

## 2015-09-23 DIAGNOSIS — M791 Myalgia: Secondary | ICD-10-CM | POA: Diagnosis not present

## 2015-09-23 DIAGNOSIS — M5416 Radiculopathy, lumbar region: Secondary | ICD-10-CM

## 2015-09-23 MED ORDER — MELOXICAM 7.5 MG PO TABS: 7.5000 mg | ORAL_TABLET | Freq: Two times a day (BID) | ORAL | Status: DC

## 2015-09-23 MED ORDER — HYDROCODONE-ACETAMINOPHEN 10-325 MG PO TABS: 1.0000 | ORAL_TABLET | ORAL | Status: DC

## 2015-09-23 NOTE — Progress Notes (Signed)
Safety precautions to be maintained throughout the outpatient stay will include: orient to surroundings, keep bed in low position, maintain call bell within reach at all times, provide assistance with transfer out of bed and ambulation.  

## 2015-09-23 NOTE — Patient Instructions (Signed)
You were given a prescription for Meloxicam today.

## 2015-09-25 NOTE — Progress Notes (Signed)
   Subjective:    Patient ID: Carrie Gardner, female    DOB: 17-May-1951, 65 y.o.   MRN: [id number]  HPI  This patient return to the clinic today indicating that she was making good progress Her subjective pain intensity rated  Was 50% She was coping quite well with her pain The medications were and were not having any adverse effects upon her. She was use and both meloxicam and Norco to manage her pain She had benefited from disagrees of nerve blocks that she had received  Review of Systems  Constitutional: Negative.   HENT: Negative.   Eyes: Negative.   Respiratory: Negative.   Cardiovascular: Negative.   Gastrointestinal: Negative.   Endocrine: Negative.   Genitourinary: Negative.   Musculoskeletal: Positive for myalgias, back pain, joint swelling and arthralgias.  Skin: Negative.   Allergic/Immunologic: Negative.   Neurological: Negative.   Hematological: Negative.   Psychiatric/Behavioral: Negative.        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         Assessment & Plan:   Assessment 1 chronic low back pain 2  lumbar degenerative disc disease 3 lumbar radiculopathy   Plan of management  1 Will plan to continue meloxicam 7.5 mg twice a day and gave 28 tablets with one refill 2 we will continue nor call 10/325 to be taken once a day on a when necessa 3 will  follow up with her in 3-4 weeks   Established patient           Level III   Tod Persia M.D.

## 2015-10-13 ENCOUNTER — Other Ambulatory Visit: Payer: Self-pay

## 2015-10-21 ENCOUNTER — Encounter: Payer: Self-pay | Admitting: Anesthesiology

## 2015-10-21 ENCOUNTER — Ambulatory Visit: Payer: Medicare Other | Attending: Anesthesiology | Admitting: Anesthesiology

## 2015-10-21 VITALS — BP 160/79 | HR 78 | Temp 97.9°F | Resp 16 | Ht 63.0 in | Wt 230.0 lb

## 2015-10-21 DIAGNOSIS — G8929 Other chronic pain: Secondary | ICD-10-CM | POA: Insufficient documentation

## 2015-10-21 DIAGNOSIS — M5116 Intervertebral disc disorders with radiculopathy, lumbar region: Secondary | ICD-10-CM | POA: Diagnosis not present

## 2015-10-21 DIAGNOSIS — M545 Low back pain, unspecified: Secondary | ICD-10-CM

## 2015-10-21 DIAGNOSIS — M5137 Other intervertebral disc degeneration, lumbosacral region: Secondary | ICD-10-CM

## 2015-10-21 DIAGNOSIS — M5416 Radiculopathy, lumbar region: Secondary | ICD-10-CM

## 2015-10-21 MED ORDER — HYDROCODONE-ACETAMINOPHEN 10-325 MG PO TABS: 1.0000 | ORAL_TABLET | ORAL | Status: DC

## 2015-10-21 NOTE — Progress Notes (Signed)
Safety precautions to be maintained throughout the outpatient stay will include: orient to surroundings, keep bed in low position, maintain call bell within reach at all times, provide assistance with transfer out of bed and ambulation.  

## 2015-10-21 NOTE — Patient Instructions (Signed)
You were given a prescription for Hydrocodone today

## 2015-10-24 NOTE — Progress Notes (Signed)
   Subjective:    Patient ID: Carrie Gardner, female    DOB: July 05, 1950, 64 y.o.   MRN: [id number]  HPI  This patient returned to the clinic today indicating that she is contin She has refused all no blocks and shows some pain exaggeration and pain dramatization behavior She appears to be drug-seeking I discussed The need to avoid intake and I told her that I wo opioids gradually He appeared not to like that approach but I reassured her that this was what was going to happen He reluctantly accepted this 10 minute discussion She refused all no blocks and did not appear to be in any distress  Review of Systems  Constitutional: Negative.   HENT: Negative.   Eyes: Negative.   Respiratory: Negative.   Cardiovascular: Negative.   Gastrointestinal: Negative.   Endocrine: Negative.   Genitourinary: Negative.   Musculoskeletal: Positive for myalgias, back pain, arthralgias and gait problem. Negative for joint swelling, neck pain and neck stiffness.  Skin: Negative.   Allergic/Immunologic: Negative.   Neurological: Negative.   Hematological: Negative.   Psychiatric/Behavioral: Negative.        Objective:   Physical Exam  Cardiovascular:  Physical examination was unremarkable There appeared to be no new neurological normal musculoskeletal findings She appeared to be in no distress Vital signs were stable Blood pressure was 160/79 mmHg Temperature was 97.31F Pulse was 78 bpm Respirations were 16 respiratory minute SPO2 was 100%  Nursing note and vitals reviewed.         Assessment & Plan:   Assessment 1 Chronic low back pain 2 Lumbar degenerative disc disease 3 lumbar radiculopathy    Of management 1 we will decrease her Norco 10/325 mg daily at bedtime when necessary # 30 tablets 2 to follow-up with her in 1 month    Established patient         Level III    Tod Persia M.D.

## 2015-11-18 ENCOUNTER — Encounter: Payer: Self-pay | Admitting: Anesthesiology

## 2015-11-18 ENCOUNTER — Ambulatory Visit: Payer: Medicare Other | Attending: Anesthesiology | Admitting: Anesthesiology

## 2015-11-18 VITALS — BP 140/77 | HR 87 | Temp 98.1°F | Resp 16 | Ht 63.0 in | Wt 236.0 lb

## 2015-11-18 DIAGNOSIS — M545 Low back pain: Secondary | ICD-10-CM | POA: Insufficient documentation

## 2015-11-18 DIAGNOSIS — G8929 Other chronic pain: Secondary | ICD-10-CM | POA: Insufficient documentation

## 2015-11-18 DIAGNOSIS — M5116 Intervertebral disc disorders with radiculopathy, lumbar region: Secondary | ICD-10-CM | POA: Diagnosis not present

## 2015-11-18 DIAGNOSIS — R52 Pain, unspecified: Secondary | ICD-10-CM | POA: Diagnosis present

## 2015-11-18 MED ORDER — HYDROCODONE-ACETAMINOPHEN 10-325 MG PO TABS: 1.0000 | ORAL_TABLET | ORAL | Status: DC

## 2015-11-18 NOTE — Progress Notes (Signed)
   Subjective:    Patient ID: MALCOLM HETZ, female    DOB: [birth year], 65 y.o.   MRN: [id number]  HPI  This patient returned to the clinic today indicating that she is in excruciating pain Her behavior does not match her general appearance.  she appears to be displaying some drug seeking behavior She indicated that the meloxicam and that I prescribed for her is affecting her stomach I will therefore discontinue the meloxicam She indicates that she would like to have more medicine and I told her that was not going to happen because it was not necessary and was dangerous from the point of view of the respiratory complications of opioids  she reluctantly accepted my advice There were no new medical issues or problems  Review of Systems  Constitutional: Negative.   HENT: Negative.   Eyes: Negative.   Respiratory: Negative.   Cardiovascular: Negative.   Gastrointestinal: Negative.   Endocrine: Negative.   Genitourinary: Negative.   Musculoskeletal: Negative.   Skin: Negative.   Allergic/Immunologic: Negative.   Neurological: Negative.   Hematological: Negative.   Psychiatric/Behavioral: Negative.        Objective:   Physical Exam  Cardiovascular:  This patient appears to be in no distress Her vital signs are stable Blood pressure is 140/77 mmHg Her pulse is 87 bpm Respirations are 16 breaths per minute SPO2 was on 100% Her temperature is 98.18F There are no new neurological or musculoskeletal findings  Nursing note and vitals reviewed.         Assessment & Plan:    assessment 1 chronic low  Back pain 2 lumbar degenerative disc disease 3 lumbar radiculopathy   Plan of management  will continue Norco 10/325 one tablet to be taken at night # 30 tablets We'll follow-up with her in 1 month   Established patient     Level III    Tod Persia M.D.

## 2015-11-18 NOTE — Patient Instructions (Signed)
You were given a prescription for Norco today.

## 2015-11-18 NOTE — Progress Notes (Signed)
Patient here today for medication management.  Safety precautions to be maintained throughout the outpatient stay will include: orient to surroundings, keep bed in low position, maintain call bell within reach at all times, provide assistance with transfer out of bed and ambulation.  

## 2015-12-16 ENCOUNTER — Ambulatory Visit: Payer: Medicare Other | Admitting: Anesthesiology

## 2015-12-30 DIAGNOSIS — R0602 Shortness of breath: Secondary | ICD-10-CM | POA: Insufficient documentation

## 2015-12-30 DIAGNOSIS — I2510 Atherosclerotic heart disease of native coronary artery without angina pectoris: Secondary | ICD-10-CM | POA: Insufficient documentation

## 2016-01-17 ENCOUNTER — Ambulatory Visit: Payer: Medicare Other | Attending: Family Medicine

## 2016-01-17 DIAGNOSIS — M545 Low back pain: Secondary | ICD-10-CM | POA: Insufficient documentation

## 2016-01-17 NOTE — Therapy (Addendum)
[facility] [address] [place], [address] Phone: [phone]   Fax:  [phone]  Physical Therapy Screen  Patient Details  Name: Carrie Gardner MRN: [id number] Date of Birth: 06/16/1950 Referring Provider: Maryruth Hancock, MD  Encounter Date: 01/17/2016      PT End of Session - 01/17/16 1011    Visit Number 1   Number of Visits 1   Authorization Type 1   Authorization Time Period of 10   PT Start Time 0941   PT Stop Time 1005  Part of time spent on phone contacting medical modalities about insurance coverage   PT Time Calculation (min) 24 min   Activity Tolerance Patient tolerated treatment well   Behavior During Therapy [facility] for tasks assessed/performed      Past Medical History:  Diagnosis Date  . Allergy   . Arthritis   . Bradycardia   . Cataract   . Chronic kidney disease   . COPD (chronic obstructive pulmonary disease) (HCC)   . Diabetes mellitus without complication (HCC)   . Fibromyalgia   . Opiate use 06/03/2015  . Sciatica   . Seizures (HCC)   . Sleep apnea   . Thyroid disease   . TIA (transient ischemic attack)   . Vitiligo     Past Surgical History:  Procedure Laterality Date  . ABDOMINAL HYSTERECTOMY    . CHOLECYSTECTOMY    . LAPAROSCOPIC GASTRIC BAND REMOVAL WITH LAPAROSCOPIC GASTRIC SLEEVE RESECTION    . LAPAROSCOPIC GASTRIC BANDING    . PACEMAKER INSERTION    . SPINE SURGERY      There were no vitals filed for this visit.       Subjective Assessment - 01/17/16 1009    Pertinent History Low back pain. Pt states having a history of seizures and currently has a pacemaker. Does not want to do physical therapy. Tried PT before at another location which did not help.             [facility] PT Assessment - 01/17/16 1027      Assessment   Referring Provider Maryruth Hancock, MD       Objectives   Pt was recommended to get a bottom cane tip so that her wooden SPC (pt  did not bring) does not slip and educated patient that stores such as [employer] and [employer] may carry them besides medical supply stores. Pt was also recommended to try to get an adjustable SPC (cane height up to her R greater trochanter secondary to L LE weakness per pt) to be able to ambulate safely and more comfortably since her wooden SPC (does not have a grip at the tip) might not be at a right height. Pt states that she currently uses her wooden SPC on her R side due to her L LE weakness. Pt verbalized understanding.                                    Plan - 01/17/16 1012    Clinical Impression Statement Patient is a 65 year old female who came to physical therapy for an eval for a TENS unit. Unable to recommend a TENS unit secondary to to following contraindications: history of seizures and currently has a pacemaker. Pt also did not want to participate in physical therapy. Pt was referred back to her MD.    Earlyne Iba and Agree with  Plan of Care Patient      Patient will benefit from skilled therapeutic intervention in order to improve the following deficits and impairments:     Visit Diagnosis: Bilateral low back pain, with sciatica presence unspecified     Problem List Patient Active Problem List   Diagnosis Date Noted  . Chronic pain 06/03/2015  . Long term current use of opiate analgesic 06/03/2015  . Long term prescription opiate use 06/03/2015  . Opiate use 06/03/2015  . Back pain at L4-L5 level 02/11/2015  . DDD (degenerative disc disease), lumbosacral 02/11/2015  . Lumbar radiculopathy 02/11/2015  . Morbid obesity (HCC) 08/13/2014  . Fibromyalgia 09/01/2012  . Restless leg 04/26/2010  . Esophagitis, reflux 12/24/2007  . Apnea, sleep 07/18/2006    Thank you for your referral.  Loralyn Freshwater PT, DPT   01/17/2016, 10:28 AM  [facility] [address] [place],  [address] Phone: [phone]   Fax:  [phone]  Name: Carrie Gardner MRN: [id number] Date of Birth: 01-30-1951

## 2016-01-19 ENCOUNTER — Ambulatory Visit: Payer: Medicare Other

## 2016-01-25 ENCOUNTER — Ambulatory Visit: Payer: Medicare Other

## 2016-02-20 ENCOUNTER — Ambulatory Visit: Payer: Medicare Other | Admitting: Anesthesiology

## 2016-02-25 DIAGNOSIS — R892 Abnormal level of other drugs, medicaments and biological substances in specimens from other organs, systems and tissues: Secondary | ICD-10-CM | POA: Insufficient documentation

## 2016-02-29 DIAGNOSIS — E1142 Type 2 diabetes mellitus with diabetic polyneuropathy: Secondary | ICD-10-CM | POA: Insufficient documentation

## 2016-02-29 DIAGNOSIS — M5442 Lumbago with sciatica, left side: Secondary | ICD-10-CM

## 2016-02-29 DIAGNOSIS — G8929 Other chronic pain: Secondary | ICD-10-CM | POA: Insufficient documentation

## 2016-02-29 DIAGNOSIS — M5441 Lumbago with sciatica, right side: Secondary | ICD-10-CM

## 2016-09-12 ENCOUNTER — Encounter: Payer: Self-pay | Admitting: *Deleted

## 2016-09-12 ENCOUNTER — Emergency Department
Admission: EM | Admit: 2016-09-12 | Discharge: 2016-09-12 | Disposition: A | Discharge status: Home / Self Care | Payer: Medicare Other | Attending: Emergency Medicine | Admitting: Emergency Medicine

## 2016-09-12 ENCOUNTER — Emergency Department: Payer: Medicare Other

## 2016-09-12 DIAGNOSIS — N189 Chronic kidney disease, unspecified: Secondary | ICD-10-CM | POA: Insufficient documentation

## 2016-09-12 DIAGNOSIS — E1165 Type 2 diabetes mellitus with hyperglycemia: Secondary | ICD-10-CM | POA: Diagnosis not present

## 2016-09-12 DIAGNOSIS — R079 Chest pain, unspecified: Secondary | ICD-10-CM | POA: Diagnosis not present

## 2016-09-12 DIAGNOSIS — R0602 Shortness of breath: Secondary | ICD-10-CM

## 2016-09-12 DIAGNOSIS — J449 Chronic obstructive pulmonary disease, unspecified: Secondary | ICD-10-CM | POA: Diagnosis not present

## 2016-09-12 DIAGNOSIS — Z79899 Other long term (current) drug therapy: Secondary | ICD-10-CM | POA: Insufficient documentation

## 2016-09-12 DIAGNOSIS — R739 Hyperglycemia, unspecified: Secondary | ICD-10-CM

## 2016-09-12 DIAGNOSIS — Z794 Long term (current) use of insulin: Secondary | ICD-10-CM | POA: Diagnosis not present

## 2016-09-12 DIAGNOSIS — R109 Unspecified abdominal pain: Secondary | ICD-10-CM

## 2016-09-12 DIAGNOSIS — N3000 Acute cystitis without hematuria: Secondary | ICD-10-CM | POA: Diagnosis not present

## 2016-09-12 DIAGNOSIS — R112 Nausea with vomiting, unspecified: Secondary | ICD-10-CM

## 2016-09-12 DIAGNOSIS — E1122 Type 2 diabetes mellitus with diabetic chronic kidney disease: Secondary | ICD-10-CM | POA: Insufficient documentation

## 2016-09-12 LAB — COMPREHENSIVE METABOLIC PANEL
ALK PHOS: 71 U/L (ref 38–126)
ALT: 28 U/L (ref 14–54)
ANION GAP: 15 (ref 5–15)
AST: 40 U/L (ref 15–41)
Albumin: 3.9 g/dL (ref 3.5–5.0)
BILIRUBIN TOTAL: 0.4 mg/dL (ref 0.3–1.2)
BUN: 27 mg/dL — ABNORMAL HIGH (ref 6–20)
CALCIUM: 10.1 mg/dL (ref 8.9–10.3)
CO2: 21 mmol/L — ABNORMAL LOW (ref 22–32)
Chloride: 97 mmol/L — ABNORMAL LOW (ref 101–111)
Creatinine, Ser: 1.6 mg/dL — ABNORMAL HIGH (ref 0.44–1.00)
GFR, EST AFRICAN AMERICAN: 38 mL/min — AB (ref >60)
GFR, EST NON AFRICAN AMERICAN: 33 mL/min — AB (ref >60)
Glucose, Bld: 443 mg/dL — ABNORMAL HIGH (ref 65–99)
Potassium: 3.5 mmol/L (ref 3.5–5.1)
Sodium: 133 mmol/L — ABNORMAL LOW (ref 135–145)
TOTAL PROTEIN: 7.5 g/dL (ref 6.5–8.1)

## 2016-09-12 LAB — CBC
HCT: 41 % (ref 35.0–47.0)
HEMOGLOBIN: 13.7 g/dL (ref 12.0–16.0)
MCH: 28.2 pg (ref 26.0–34.0)
MCHC: 33.5 g/dL (ref 32.0–36.0)
MCV: 84.1 fL (ref 80.0–100.0)
Platelets: 283 10*3/uL (ref 150–440)
RBC: 4.88 MIL/uL (ref 3.80–5.20)
RDW: 12.8 % (ref 11.5–14.5)
WBC: 4.6 10*3/uL (ref 3.6–11.0)

## 2016-09-12 LAB — BASIC METABOLIC PANEL
ANION GAP: 8 (ref 5–15)
BUN: 26 mg/dL — ABNORMAL HIGH (ref 6–20)
CALCIUM: 9 mg/dL (ref 8.9–10.3)
CHLORIDE: 102 mmol/L (ref 101–111)
CO2: 22 mmol/L (ref 22–32)
Creatinine, Ser: 1.79 mg/dL — ABNORMAL HIGH (ref 0.44–1.00)
GFR calc non Af Amer: 29 mL/min — ABNORMAL LOW (ref >60)
GFR, EST AFRICAN AMERICAN: 33 mL/min — AB (ref >60)
Glucose, Bld: 423 mg/dL — ABNORMAL HIGH (ref 65–99)
POTASSIUM: 4.5 mmol/L (ref 3.5–5.1)
Sodium: 132 mmol/L — ABNORMAL LOW (ref 135–145)

## 2016-09-12 LAB — URINALYSIS, COMPLETE (UACMP) WITH MICROSCOPIC
BILIRUBIN URINE: NEGATIVE
Hgb urine dipstick: NEGATIVE
KETONES UR: NEGATIVE mg/dL
Leukocytes, UA: NEGATIVE
NITRITE: NEGATIVE
PH: 5 (ref 5.0–8.0)
Protein, ur: 30 mg/dL — AB
Specific Gravity, Urine: 1.022 (ref 1.005–1.030)

## 2016-09-12 LAB — TROPONIN I
Troponin I: <0.03 ng/mL (ref <0.03)
Troponin I: <0.03 ng/mL (ref <0.03)

## 2016-09-12 LAB — LIPASE, BLOOD: Lipase: 54 U/L — ABNORMAL HIGH (ref 11–51)

## 2016-09-12 LAB — GLUCOSE, CAPILLARY: Glucose-Capillary: 383 mg/dL — ABNORMAL HIGH (ref 65–99)

## 2016-09-12 LAB — BRAIN NATRIURETIC PEPTIDE: B Natriuretic Peptide: 9 pg/mL (ref 0.0–100.0)

## 2016-09-12 MED ORDER — INSULIN ASPART 100 UNIT/ML ~~LOC~~ SOLN
10.0000 [IU] | Freq: Once | SUBCUTANEOUS | Status: AC
  Administered 2016-09-12: 10 [IU] via SUBCUTANEOUS
  Filled 2016-09-12: qty 10

## 2016-09-12 MED ORDER — BARIUM SULFATE 2.1 % PO SUSP
450.0000 mL | ORAL | Status: AC
  Administered 2016-09-12 (×2): 450 mL via ORAL

## 2016-09-12 MED ORDER — CEFTRIAXONE SODIUM-DEXTROSE 1-3.74 GM-% IV SOLR
1.0000 g | Freq: Once | INTRAVENOUS | Status: AC
  Administered 2016-09-12: 1 g via INTRAVENOUS
  Filled 2016-09-12: qty 50

## 2016-09-12 MED ORDER — ONDANSETRON 4 MG PO TBDP: 4.0000 mg | ORAL_TABLET | Freq: Three times a day (TID) | ORAL | 0 refills | Status: DC | PRN

## 2016-09-12 MED ORDER — DEXTROSE 5 % IV SOLN: 1.0000 g | Freq: Once | INTRAVENOUS | Status: DC

## 2016-09-12 MED ORDER — CEPHALEXIN 500 MG PO CAPS: 500.0000 mg | ORAL_CAPSULE | Freq: Four times a day (QID) | ORAL | 0 refills | Status: AC

## 2016-09-12 MED ORDER — SODIUM CHLORIDE 0.9 % IV BOLUS (SEPSIS)
1000.0000 mL | Freq: Once | INTRAVENOUS | Status: AC
  Administered 2016-09-12: 1000 mL via INTRAVENOUS

## 2016-09-12 MED ORDER — ONDANSETRON 4 MG PO TBDP
8.0000 mg | ORAL_TABLET | Freq: Once | ORAL | Status: AC
  Administered 2016-09-12: 8 mg via ORAL
  Filled 2016-09-12: qty 2

## 2016-09-12 MED ORDER — ONDANSETRON HCL 4 MG/2ML IJ SOLN
4.0000 mg | Freq: Once | INTRAMUSCULAR | Status: AC
  Administered 2016-09-12: 4 mg via INTRAVENOUS
  Filled 2016-09-12: qty 2

## 2016-09-12 NOTE — ED Notes (Signed)
Pt states she has been having chest pain for about 1 week and abdominal pain for about 4 weeks. Pt states she has had N/V for 3 weeks and vomiting "at least 2x per day".

## 2016-09-12 NOTE — Discharge Instructions (Addendum)
Please eat a bland diet to prevent nausea and vomiting. You may also takes Zofran for nausea and vomiting.  Please make an appointment with your primary care physician to discuss her symptoms. Your primary care doctor may refer you to a GI/gastrointestinal doctor for further evaluation of your vomiting and abdominal pain. Your doctor may also recommended an outpatient stress test for your chest pain.  Return to the emergency department if you develop severe pain, chest pain, shortness of breath, lightheadedness or fainting, inability to keep down fluids, fever, or any other symptoms concerning to you.

## 2016-09-12 NOTE — ED Provider Notes (Addendum)
[facility] Emergency Department Provider Note  ____________________________________________  Time seen: Approximately 4:09 PM  I have reviewed the triage vital signs and the nursing notes.   HISTORY  Chief Complaint Shortness of Breath and Emesis    HPI Carrie Gardner is a 66 y.o. female with a history of obesity status post lap band procedure complicated by need to remove lap band remotely, s/p cholecystectomy, DM 2, CKD, TIA presenting with abdominal pain, nausea and vomiting, and chest pain. The patient reports that for the past 3 weeks, she has had 2 episodes of vomiting daily. She does not attribute this to food, or position. She does have associated abdominal pain, mostly in the left upper quadrant as well as bilateral lower quadrants. She denies any associated dysuria, fever or chills, constipation or diarrhea. In addition, the patient is having 1-2 daily episodes of a sharp central chest pain associated with shortness of breath. She does not have palpitations, lightheadedness or syncope, lower extremity swelling or calf pain. This chest pain is not a burning sensation and is not associated with burping. The chest pain is unrelated to the episodes of vomiting. The patient has not sought any medical attention for her symptoms. She has tried Pepto-Bismol, which has slightly helped her vomiting. She reports that her diabetes has been "out of control."   Past Medical History:  Diagnosis Date  . Allergy   . Arthritis   . Bradycardia   . Cataract   . Chronic kidney disease   . COPD (chronic obstructive pulmonary disease) (HCC)   . Diabetes mellitus without complication (HCC)   . Fibromyalgia   . Opiate use 06/03/2015  . Sciatica   . Seizures (HCC)   . Sleep apnea   . Thyroid disease   . TIA (transient ischemic attack)   . Vitiligo     Patient Active Problem List   Diagnosis Date Noted  . Chronic pain 06/03/2015  . Long term current use of opiate  analgesic 06/03/2015  . Long term prescription opiate use 06/03/2015  . Opiate use 06/03/2015  . Back pain at L4-L5 level 02/11/2015  . DDD (degenerative disc disease), lumbosacral 02/11/2015  . Lumbar radiculopathy 02/11/2015  . Morbid obesity (HCC) 08/13/2014  . Fibromyalgia 09/01/2012  . Restless leg 04/26/2010  . Esophagitis, reflux 12/24/2007  . Apnea, sleep 07/18/2006    Past Surgical History:  Procedure Laterality Date  . ABDOMINAL HYSTERECTOMY    . CHOLECYSTECTOMY    . LAPAROSCOPIC GASTRIC BAND REMOVAL WITH LAPAROSCOPIC GASTRIC SLEEVE RESECTION    . LAPAROSCOPIC GASTRIC BANDING    . PACEMAKER INSERTION    . SPINE SURGERY      Current Outpatient Rx  . Order #: [id number] Class: Historical Med  . Order #: [id number] Class: Historical Med  . Order #: [id number] Class: Historical Med  . Order #: [id number] Class: Historical Med  . Order #: [id number] Class: Historical Med  . Order #: [id number] Class: Historical Med  . Order #: [id number] Class: Historical Med  . Order #: [id number] Class: Historical Med  . Order #: [id number] Class: Historical Med  . Order #: [id number] Class: Historical Med  . Order #: [id number] Class: Historical Med  . Order #: [id number] Class: Historical Med  . Order #: [id number] Class: Historical Med  . Order #: [id number] Class: Print    Allergies Metformin; Metformin and related; Pregabalin; Ibuprofen; Iodinated diagnostic agents; Mobic [meloxicam]; Oxycodone; Oxycodone-acetaminophen; and Tramadol  Family History  Problem Relation Age of Onset  . Arthritis Mother   . Cancer Mother   . COPD Mother   .  Diabetes Mother   . Vision loss Mother   . Heart disease Mother   . Hypertension Mother   . Kidney disease Mother   . Arthritis Father   . Asthma Father   . Depression Father   . Drug abuse Father   . Vision loss Father   . Hypertension Father   . Stroke Father     Social History Social History  Substance Use Topics  . Smoking status: Never Smoker  .  Smokeless tobacco: Not on file  . Alcohol use 1.8 oz/week    3 Glasses of wine per week    Review of Systems Constitutional: No fever/chills.No lightheadedness or syncope. Eyes: No visual changes. ENT: No sore throat. No congestion or rhinorrhea. Cardiovascular: Positive chest pain. Denies palpitations. Respiratory: Positive shortness of breath.  Occasional cough. Gastrointestinal: Positive left upper quadrant and bilateral lower quadrant abdominal pain.  Positive nausea, positive vomiting.  No diarrhea.  No constipation. Genitourinary: Negative for dysuria. No urinary frequency. Musculoskeletal: Negative for back pain. Skin: Negative for rash. Neurological: Negative for headaches. No focal numbness, tingling or weakness. No difficulty walking.  10-point ROS otherwise negative.  ____________________________________________   PHYSICAL EXAM:  VITAL SIGNS: ED Triage Vitals  Enc Vitals Group     BP 09/12/16 1110 104/74     Pulse Rate 09/12/16 1110 (!) 118     Resp 09/12/16 1110 18     Temp 09/12/16 1110 98.6 F (37 C)     Temp Source 09/12/16 1110 Oral     SpO2 09/12/16 1110 100 %     Weight 09/12/16 1108 232 lb (105.2 kg)     Height 09/12/16 1108 5\' 3"  (1.6 m)     Head Circumference --      Peak Flow --      Pain Score 09/12/16 1108 10     Pain Loc --      Pain Edu? --      Excl. in GC? --     Constitutional: Alert and oriented. Chronically ill appearing and mildly uncomfortable but nontoxic. Answers questions appropriately. Eyes: Conjunctivae are normal.  EOMI. No scleral icterus. Head: Atraumatic. Nose: No congestion/rhinnorhea. Mouth/Throat: Mucous membranes are moist.  Neck: No stridor.  Supple.  No JVD. No meningismus. Cardiovascular: Normal rate, regular rhythm. No murmurs, rubs or gallops.  Respiratory: Normal respiratory effort.  No accessory muscle use or retractions. Lungs CTAB.  No wheezes, rales or ronchi. Gastrointestinal: Obese. Soft, and nondistended.   Tender to palpation in the left upper quadrant, in the bilateral lower quadrants. The right upper quadrant does not have any pain; negative Murphy sign. No guarding or rebound.  No peritoneal signs. Musculoskeletal: No LE edema. No ttp in the calves or palpable cords.  Negative Homan's sign. Neurologic:  A&Ox3.  Speech is clear.  Face and smile are symmetric.  EOMI.  Moves all extremities well. Skin:  Skin is warm, dry and intact. No rash noted. Changes consistent with vitiligo on bilateral hands and upper lip. Psychiatric: Mood and affect are normal. Speech and behavior are normal.  Normal judgement.  ____________________________________________   LABS (all labs ordered are listed, but only abnormal results are displayed)  Labs Reviewed  LIPASE, BLOOD - Abnormal; Notable for the following:       Result Value   Lipase 54 (*)    All other components within normal limits  COMPREHENSIVE METABOLIC PANEL - Abnormal; Notable for the following:    Sodium 133 (*)    Chloride 97 (*)  CO2 21 (*)    Glucose, Bld 443 (*)    BUN 27 (*)    Creatinine, Ser 1.60 (*)    GFR calc non Af Amer 33 (*)    GFR calc Af Amer 38 (*)    All other components within normal limits  URINALYSIS, COMPLETE (UACMP) WITH MICROSCOPIC - Abnormal; Notable for the following:    Color, Urine YELLOW (*)    APPearance CLOUDY (*)    Glucose, UA >=500 (*)    Protein, ur 30 (*)    Bacteria, UA MANY (*)    Squamous Epithelial / LPF 0-5 (*)    All other components within normal limits  URINE CULTURE  CBC  TROPONIN I  BRAIN NATRIURETIC PEPTIDE  TROPONIN I  BASIC METABOLIC PANEL   ____________________________________________  EKG  ED ECG REPORT I, Rockne Menghini, the attending physician, personally viewed and interpreted this ECG.   Date: 09/12/2016  EKG Time: 1113  Rate: 119  Rhythm: sinus tachycardia  Axis: leftward  Intervals:none  ST&T Change: No  STEMI  ____________________________________________  RADIOLOGY  Ct Abdomen Pelvis Wo Contrast  Result Date: 09/12/2016 CLINICAL DATA:  Chest pain for 1 week. Abdominal pain for 4 weeks. Nausea and vomiting at least 2 times a day. Initial encounter. EXAM: CT ABDOMEN AND PELVIS WITHOUT CONTRAST TECHNIQUE: Multidetector CT imaging of the abdomen and pelvis was performed following the standard protocol without IV contrast. COMPARISON:  None FINDINGS: Lower chest: The lung bases demonstrate mild dependent atelectasis. No other focal nodule, mass, or airspace disease present. The heart size is normal. Pacing wires are in place. No significant pleural or pericardial effusion is present. Hepatobiliary: Ill-defined area of hypoattenuation is present posteriorly in the right lobe of the liver. This appears to be artifactual with some streak artifact. No discrete lesion is evident. The common bile duct is within normal limits following cholecystectomy. Pancreas: Unremarkable. No pancreatic ductal dilatation or surrounding inflammatory changes. Spleen: Normal in size without focal abnormality. Adrenals/Urinary Tract: The adrenal glands are normal bilaterally. The kidneys and ureters are within normal limits bilaterally. The urinary bladder is unremarkable. Stomach/Bowel: The stomach and duodenum are within normal limits. Small bowel is unremarkable. The appendix is visualized and normal. The ascending and transverse colon are within normal limits. The descending and sigmoid colon are normal Vascular/Lymphatic: No significant vascular findings are present. No enlarged abdominal or pelvic lymph nodes. Reproductive: Status post hysterectomy. No adnexal masses. Other: A paraumbilical hernia contains fat but no bowel. Fat herniates into the inguinal canals bilaterally, right greater than left. Musculoskeletal: Bone windows does demonstrate a vacuum disc at L4-5 L5-S1. Vertebral body heights and alignment are normal.  IMPRESSION: 1. No acute or focal abnormality to explain the patient's symptoms. 2. Degenerative changes within the lower lumbar spine. Electronically Signed   By: Marin Roberts M.D.   On: 09/12/2016 18:13   Dg Chest 2 View  Result Date: 09/12/2016 CLINICAL DATA:  Shortness of breath and generalized chest pain since last night, no relief with inhaler, vomiting for 1 month, history bradycardia, COPD, diabetes mellitus, TIA, fibromyalgia EXAM: CHEST  2 VIEW COMPARISON:  05/19/2011 FINDINGS: LEFT subclavian sequential pacemaker leads project at RIGHT atrium and RIGHT ventricle unchanged. Upper normal heart size. Mediastinal contours and pulmonary vascularity normal. Peribronchial thickening without infiltrate, pleural effusion or pneumothorax. No acute osseous findings. IMPRESSION: Stable pacemaker. Chronic bronchitic changes without infiltrate Electronically Signed   By: Ulyses Southward M.D.   On: 09/12/2016 12:15    ____________________________________________  PROCEDURES  Procedure(s) performed: None  Procedures  Critical Care performed: No ____________________________________________   INITIAL IMPRESSION / ASSESSMENT AND PLAN / ED COURSE  Pertinent labs & imaging results that were available during my care of the patient were reviewed by me and considered in my medical decision making (see chart for details).  66 y.o. female with multiple prior abdominal surgeries presenting with 3 weeks of twice daily vomiting associated with abdominal pain; the patient is also having episodes of chest pain and has multiple cardiac risk factors. He is unclear whether the patient's presentations are related, although reflux, peptic ulcer disease, and esophageal spasm could cause both of her symptoms. It is also possible that she has scarring or constriction causing mechanical obstruction resulting in vomiting from her prior abdominal surgeries. An acute infection such as appendicitis or diverticulitis is  less likely. A CT scan will help evaluate her abdominal symptoms, and we will do cardiac workup including a second troponin. The patient's EKG does not show ischemic changes and her first troponin is negative. Her chest x-ray does not show any acute cardiopulmonary process. She does have chronic bronchitic changes which may explain her dry mild intermittent cough. No antibiotic treatment is indicated for this at this time  ----------------------------------------- 4:16 PM on 09/12/2016 -----------------------------------------  The patient does have significant glucose in her urine as well as many bacteria although she is negative for leukocyte Estrace or nitrites. However, given her diabetes, we'll give her a gram of Rocephin to treat a UTI and a culture has been sent. In addition, the patient is markedly hyperglycemic with a glucose of 443 and her anion gap is 15; this is borderline for DKA and I will treat her with insulin and IVF and recheck her labs.  She has renal insufficiency today that is within her baseline.  And reevaluation for final disposition.  ----------------------------------------- 6:26 PM on 09/12/2016 -----------------------------------------  At this time, the patient is hemodynamically stable and asymptomatic. She is no longer having any pain in the abdomen or chest, she is not short of breath, and she is able to tolerate liquid without vomiting. Her chest pain workup has been reassuring with an EKG that does not show ischemic changes, chest x-ray which does not show any abnormalities of the cardiac silhouette, and a troponin which is negative. I have encouraged her to ask her primary care physician to set up an outpatient stress test for her. Her abdominal pain and vomiting workup has also been reassuring. There are no obvious reasons on her CT scan for her symptoms. However, I have encouraged her to ask her primary care physician for a gastrointestinal doctor referral for  possible endoscopy and further evaluation.   The patient has received 2 L of fluid, subcutaneous insulin, and we will get a repeat BMP to evaluate her blood sugar and her anion gap. If these have improved, we will plan discharge home with close PMD follow-up. Return precautions and follow-up instructions were discussed.  ----------------------------------------- 7:41 PM on 09/12/2016 -----------------------------------------  The patient continues to feel much better and wishes to go home at this time. Her repeat BMP shows an improved anion gap although her glucose remains in the 400s. We'll give her an additional dose of insulin prior to her discharge. She understands the need to continue her regular diabetes medications and talk to her doctor about her hyperglycemia. Her repeat troponin is also negative. The patient does not wish to wait for further treatment. Plan discharge at this time. ____________________________________________  FINAL CLINICAL IMPRESSION(S) / ED DIAGNOSES  Final diagnoses:  Chest pain, unspecified type  Shortness of breath  Non-intractable vomiting with nausea, unspecified vomiting type  Abdominal pain, unspecified abdominal location         NEW MEDICATIONS STARTED DURING THIS VISIT:  New Prescriptions   No medications on file      Rockne Menghini, MD 09/12/16 1829    Rockne Menghini, MD 09/12/16 0177

## 2016-09-12 NOTE — ED Triage Notes (Signed)
States SOB and vomiting with bad pain for 3 days, denies any cough, states feeling dizzy at times, pt awake and alert in no acute distress

## 2016-09-13 LAB — URINE CULTURE

## 2016-09-26 DIAGNOSIS — Z950 Presence of cardiac pacemaker: Secondary | ICD-10-CM | POA: Insufficient documentation

## 2016-09-26 DIAGNOSIS — R079 Chest pain, unspecified: Secondary | ICD-10-CM | POA: Insufficient documentation

## 2016-11-08 ENCOUNTER — Encounter
Admission: RE | Disposition: A | Discharge status: Home / Self Care | Payer: Self-pay | Source: Ambulatory Visit | Attending: Cardiology

## 2016-11-08 ENCOUNTER — Ambulatory Visit
Admission: RE | Admit: 2016-11-08 | Discharge: 2016-11-08 | Disposition: A | Discharge status: Home / Self Care | Payer: Medicare Other | Source: Ambulatory Visit | Attending: Cardiology | Admitting: Cardiology

## 2016-11-08 DIAGNOSIS — Z79899 Other long term (current) drug therapy: Secondary | ICD-10-CM | POA: Insufficient documentation

## 2016-11-08 DIAGNOSIS — Z7982 Long term (current) use of aspirin: Secondary | ICD-10-CM | POA: Insufficient documentation

## 2016-11-08 DIAGNOSIS — I2510 Atherosclerotic heart disease of native coronary artery without angina pectoris: Secondary | ICD-10-CM | POA: Diagnosis not present

## 2016-11-08 DIAGNOSIS — Z794 Long term (current) use of insulin: Secondary | ICD-10-CM | POA: Diagnosis not present

## 2016-11-08 DIAGNOSIS — I129 Hypertensive chronic kidney disease with stage 1 through stage 4 chronic kidney disease, or unspecified chronic kidney disease: Secondary | ICD-10-CM | POA: Diagnosis not present

## 2016-11-08 DIAGNOSIS — E079 Disorder of thyroid, unspecified: Secondary | ICD-10-CM | POA: Diagnosis not present

## 2016-11-08 DIAGNOSIS — E1122 Type 2 diabetes mellitus with diabetic chronic kidney disease: Secondary | ICD-10-CM | POA: Insufficient documentation

## 2016-11-08 DIAGNOSIS — J45909 Unspecified asthma, uncomplicated: Secondary | ICD-10-CM | POA: Insufficient documentation

## 2016-11-08 DIAGNOSIS — E785 Hyperlipidemia, unspecified: Secondary | ICD-10-CM | POA: Diagnosis not present

## 2016-11-08 DIAGNOSIS — Z950 Presence of cardiac pacemaker: Secondary | ICD-10-CM | POA: Diagnosis not present

## 2016-11-08 DIAGNOSIS — N189 Chronic kidney disease, unspecified: Secondary | ICD-10-CM | POA: Insufficient documentation

## 2016-11-08 DIAGNOSIS — E1140 Type 2 diabetes mellitus with diabetic neuropathy, unspecified: Secondary | ICD-10-CM | POA: Diagnosis not present

## 2016-11-08 HISTORY — DX: Essential (primary) hypertension: I10

## 2016-11-08 HISTORY — PX: LEFT HEART CATH AND CORONARY ANGIOGRAPHY: CATH118249

## 2016-11-08 SURGERY — LEFT HEART CATH AND CORONARY ANGIOGRAPHY
Anesthesia: Moderate Sedation | Laterality: Left

## 2016-11-08 MED ORDER — ASPIRIN 81 MG PO CHEW: 81.0000 mg | CHEWABLE_TABLET | ORAL | Status: DC

## 2016-11-08 MED ORDER — SODIUM CHLORIDE 0.9 % WEIGHT BASED INFUSION: 1.0000 mL/kg/h | INTRAVENOUS | Status: DC

## 2016-11-08 MED ORDER — SODIUM CHLORIDE 0.9% FLUSH: 3.0000 mL | Freq: Two times a day (BID) | INTRAVENOUS | Status: DC

## 2016-11-08 MED ORDER — FENTANYL CITRATE (PF) 100 MCG/2ML IJ SOLN
INTRAMUSCULAR | Status: DC | PRN
  Administered 2016-11-08: 50 ug via INTRAVENOUS

## 2016-11-08 MED ORDER — IOPAMIDOL (ISOVUE-300) INJECTION 61%
INTRAVENOUS | Status: DC | PRN
  Administered 2016-11-08: 80 mL via INTRA_ARTERIAL

## 2016-11-08 MED ORDER — SODIUM CHLORIDE 0.9 % WEIGHT BASED INFUSION
3.0000 mL/kg/h | INTRAVENOUS | Status: DC
  Administered 2016-11-08: 3 mL/kg/h via INTRAVENOUS

## 2016-11-08 MED ORDER — SODIUM CHLORIDE 0.9% FLUSH: 3.0000 mL | INTRAVENOUS | Status: DC | PRN

## 2016-11-08 MED ORDER — MIDAZOLAM HCL 2 MG/2ML IJ SOLN
INTRAMUSCULAR | Status: AC
  Filled 2016-11-08: qty 2

## 2016-11-08 MED ORDER — ONDANSETRON HCL 4 MG/2ML IJ SOLN: 4.0000 mg | Freq: Four times a day (QID) | INTRAMUSCULAR | Status: DC | PRN

## 2016-11-08 MED ORDER — ASPIRIN 81 MG PO CHEW
CHEWABLE_TABLET | ORAL | Status: AC
  Filled 2016-11-08: qty 1

## 2016-11-08 MED ORDER — MIDAZOLAM HCL 2 MG/2ML IJ SOLN
INTRAMUSCULAR | Status: DC | PRN
  Administered 2016-11-08: 1 mg via INTRAVENOUS

## 2016-11-08 MED ORDER — HEPARIN (PORCINE) IN NACL 2-0.9 UNIT/ML-% IJ SOLN
INTRAMUSCULAR | Status: AC
Start: 2016-11-08 — End: 2016-11-08
  Filled 2016-11-08: qty 500

## 2016-11-08 MED ORDER — FENTANYL CITRATE (PF) 100 MCG/2ML IJ SOLN
INTRAMUSCULAR | Status: AC
  Filled 2016-11-08: qty 2

## 2016-11-08 MED ORDER — ACETAMINOPHEN 325 MG PO TABS: 650.0000 mg | ORAL_TABLET | ORAL | Status: DC | PRN

## 2016-11-08 MED ORDER — SODIUM CHLORIDE 0.9 % IV SOLN: 250.0000 mL | INTRAVENOUS | Status: DC | PRN

## 2016-11-08 SURGICAL SUPPLY — 9 items
CATH 5FR PIGTAIL DIAGNOSTIC (CATHETERS) ×3 IMPLANT
CATH INFINITI 5FR JL4 (CATHETERS) ×3 IMPLANT
CATH INFINITI JR4 5F (CATHETERS) ×3 IMPLANT
DEVICE CLOSURE MYNXGRIP 5F (Vascular Products) ×3 IMPLANT
KIT MANI 3VAL PERCEP (MISCELLANEOUS) ×3 IMPLANT
NEEDLE PERC 18GX7CM (NEEDLE) ×3 IMPLANT
PACK CARDIAC CATH (CUSTOM PROCEDURE TRAY) ×3 IMPLANT
SHEATH AVANTI 5FR X 11CM (SHEATH) ×3 IMPLANT
WIRE EMERALD 3MM-J .035X150CM (WIRE) ×3 IMPLANT

## 2016-11-08 NOTE — Discharge Instructions (Signed)

## 2016-11-14 ENCOUNTER — Encounter: Payer: Medicare Other | Attending: Gastroenterology | Admitting: *Deleted

## 2016-11-14 ENCOUNTER — Encounter: Payer: Self-pay | Admitting: *Deleted

## 2016-11-14 VITALS — BP 120/80 | Ht 63.0 in | Wt 242.1 lb

## 2016-11-14 DIAGNOSIS — Z713 Dietary counseling and surveillance: Secondary | ICD-10-CM | POA: Insufficient documentation

## 2016-11-14 DIAGNOSIS — E1165 Type 2 diabetes mellitus with hyperglycemia: Secondary | ICD-10-CM

## 2016-11-14 DIAGNOSIS — E119 Type 2 diabetes mellitus without complications: Secondary | ICD-10-CM | POA: Diagnosis not present

## 2016-11-14 DIAGNOSIS — Z794 Long term (current) use of insulin: Secondary | ICD-10-CM

## 2016-11-14 NOTE — Patient Instructions (Signed)
Check blood sugars 3 x day before each meal every day Bring blood sugar records to the next appointment  Walk as tolerated  Eat 3 meals day,   1-2 snacks a day Space meals 4-6 hours apart Don't skip meals Complete 3 Day Food Record and bring to next appt  Make an eye doctor appointment once new insurance is effective  Carry fast acting glucose and a snack at all times Rotate injection sites Put on clean pen needle prior to each injection  Return for appointment on:  Monday December 03, 2016 at 9:30 am with [facility] (dietitian)

## 2016-11-14 NOTE — Progress Notes (Signed)
Diabetes Self-Management Education  Visit Type: First/Initial  Appt. Start Time: 1020 Appt. End Time: 1130  11/14/2016  Ms. Karalyne Nusser, identified by name and date of birth, is a 66 y.o. female with a diagnosis of Diabetes: Type 2.   ASSESSMENT  Blood pressure 120/80, height 5\' 3"  (1.6 m), weight 242 lb 1.6 oz (109.8 kg). Body mass index is 42.89 kg/m.      Diabetes Self-Management Education - 11/14/16 1227      Visit Information   Visit Type First/Initial     Initial Visit   Diabetes Type Type 2   Are you currently following a meal plan? Yes   What type of meal plan do you follow? DASH   Are you taking your medications as prescribed? No - Pt reports she doesn't take her NovoLog because she doesn't eat.    Date Diagnosed 7 years ago     Health Coping   How would you rate your overall health? Fair     Psychosocial Assessment   Patient Belief/Attitude about Diabetes Other (comment)  "dependent"   Self-care barriers Unsteady gait/risk for falls   Self-management support Doctor's office   Patient Concerns Nutrition/Meal planning;Glycemic Control;Medication;Weight Control;Healthy Lifestyle   Special Needs None   Preferred Learning Style Hands on   Learning Readiness Ready   How often do you need to have someone help you when you read instructions, pamphlets, or other written materials from your doctor or pharmacy? 1 - Never   What is the last grade level you completed in school? 2 years college     Pre-Education Assessment   Patient understands the diabetes disease and treatment process. Needs Instruction   Patient understands incorporating nutritional management into lifestyle. Needs Instruction   Patient undertands incorporating physical activity into lifestyle. Needs Instruction   Patient understands using medications safely. Needs Instruction   Patient understands monitoring blood glucose, interpreting and using results Needs Review   Patient understands prevention,  detection, and treatment of acute complications. Needs Review   Patient understands prevention, detection, and treatment of chronic complications. Needs Instruction   Patient understands how to develop strategies to address psychosocial issues. Needs Instruction   Patient understands how to develop strategies to promote health/change behavior. Needs Instruction     Complications   Last HgB A1C per patient/outside source 11.8 %  02/10/16   How often do you check your blood sugar? 0 times/day (not testing)  Pt hasn't tested blood sugar since April per her meter.  All readings were 300 mg/dL to Hi.    Have you had a dilated eye exam in the past 12 months? No   Have you had a dental exam in the past 12 months? No   Are you checking your feet? Yes   How many days per week are you checking your feet? 6     Dietary Intake   Breakfast skips -  eats 0-2 meals/day   Lunch salad with vegetables and cheese (doesn't eat pork or bread of sweets)   Dinner fish, May, chicken and occasional beef, likes non-starchy vegetables - cabbage, broccolli,    Beverage(s) water     Exercise   Exercise Type ADL's     Patient Education   Previous Diabetes Education Yes (please comment)  [place]   Disease state  Explored patient's options for treatment of their diabetes   Nutrition management  Role of diet in the treatment of diabetes and the relationship between the three main macronutrients and blood glucose  level;Food label reading, portion sizes and measuring food.;Meal timing in regards to the patients' current diabetes medication.   Physical activity and exercise  Role of exercise on diabetes management, blood pressure control and cardiac health.   Medications Taught/reviewed insulin injection, site rotation, insulin storage and needle disposal.;Reviewed patients medication for diabetes, action, purpose, timing of dose and side effects; pt brought her pens from her pocketbook and the needles were attached  - one was bent without a cap   Monitoring Purpose and frequency of SMBG.;Taught/discussed recording of test results and interpretation of SMBG.;Identified appropriate SMBG and/or A1C goals.   Acute complications Taught treatment of hypoglycemia - the 15 rule.   Chronic complications Relationship between chronic complications and blood glucose control;Retinopathy and reason for yearly dilated eye exams   Psychosocial adjustment Identified and addressed patients feelings and concerns about diabetes     Individualized Goals (developed by patient)   Reducing Risk Improve blood sugars Decrease medications Lose weight Lead a healthier lifestyle     Outcomes   Expected Outcomes Demonstrated interest in learning. Expect positive outcomes   Future DMSE 2 wks      Individualized Plan for Diabetes Self-Management Training:   Learning Objective:  Patient will have a greater understanding of diabetes self-management. Patient education plan is to attend individual and/or group sessions per assessed needs and concerns.   Plan:   Patient Instructions  Check blood sugars 3 x day before each meal every day Bring blood sugar records to the next appointment Walk as tolerated Eat 3 meals day,   1-2 snacks a day Space meals 4-6 hours apart Don't skip meals Complete 3 Day Food Record and bring to next appt Make an eye doctor appointment once new insurance is effective Carry fast acting glucose and a snack at all times Rotate injection sites Put on clean pen needle prior to each injection Return for appointment on:  Monday December 03, 2016 at 9:30 am with [facility] (dietitian)   Expected Outcomes:  Demonstrated interest in learning. Expect positive outcomes  Education material provided:  General Meal Planning Guidelines Simple Meal Plan 3 Day Food Record Glucose tablets Symptoms, causes and treatments of Hypoglycemia  If problems or questions, patient to contact team via:  Sharion Settler, RN, CCM,  CDE [phone]  Future DSME appointment: 2 wks  December 03, 2016 with dietitian

## 2016-12-03 ENCOUNTER — Ambulatory Visit: Payer: Medicare Other | Admitting: Dietician

## 2016-12-27 ENCOUNTER — Ambulatory Visit: Payer: Medicare Other | Admitting: Dietician

## 2017-01-02 ENCOUNTER — Encounter: Payer: Self-pay | Admitting: Dietician

## 2017-01-02 NOTE — Progress Notes (Signed)
Patient came at the wrong time for her appointment on 12/27/16, but did not wish to stay, and did not wish to reschedule. Sent discharge letter to MD office.

## 2017-02-07 ENCOUNTER — Encounter: Payer: Self-pay | Admitting: Emergency Medicine

## 2017-02-07 ENCOUNTER — Emergency Department
Admission: EM | Admit: 2017-02-07 | Discharge: 2017-02-07 | Disposition: A | Discharge status: Home / Self Care | Payer: Medicare Other | Attending: Emergency Medicine | Admitting: Emergency Medicine

## 2017-02-07 DIAGNOSIS — E1122 Type 2 diabetes mellitus with diabetic chronic kidney disease: Secondary | ICD-10-CM | POA: Insufficient documentation

## 2017-02-07 DIAGNOSIS — Z794 Long term (current) use of insulin: Secondary | ICD-10-CM | POA: Insufficient documentation

## 2017-02-07 DIAGNOSIS — G8929 Other chronic pain: Secondary | ICD-10-CM | POA: Diagnosis not present

## 2017-02-07 DIAGNOSIS — Z8673 Personal history of transient ischemic attack (TIA), and cerebral infarction without residual deficits: Secondary | ICD-10-CM | POA: Insufficient documentation

## 2017-02-07 DIAGNOSIS — E079 Disorder of thyroid, unspecified: Secondary | ICD-10-CM | POA: Insufficient documentation

## 2017-02-07 DIAGNOSIS — N189 Chronic kidney disease, unspecified: Secondary | ICD-10-CM | POA: Insufficient documentation

## 2017-02-07 DIAGNOSIS — Z79899 Other long term (current) drug therapy: Secondary | ICD-10-CM | POA: Diagnosis not present

## 2017-02-07 DIAGNOSIS — Z7982 Long term (current) use of aspirin: Secondary | ICD-10-CM | POA: Diagnosis not present

## 2017-02-07 DIAGNOSIS — G2581 Restless legs syndrome: Secondary | ICD-10-CM | POA: Diagnosis not present

## 2017-02-07 DIAGNOSIS — E1140 Type 2 diabetes mellitus with diabetic neuropathy, unspecified: Secondary | ICD-10-CM | POA: Insufficient documentation

## 2017-02-07 DIAGNOSIS — I129 Hypertensive chronic kidney disease with stage 1 through stage 4 chronic kidney disease, or unspecified chronic kidney disease: Secondary | ICD-10-CM | POA: Diagnosis not present

## 2017-02-07 DIAGNOSIS — G9009 Other idiopathic peripheral autonomic neuropathy: Secondary | ICD-10-CM | POA: Diagnosis present

## 2017-02-07 MED ORDER — METHOCARBAMOL 500 MG PO TABS
500.0000 mg | ORAL_TABLET | Freq: Once | ORAL | Status: AC
  Administered 2017-02-07: 500 mg via ORAL
  Filled 2017-02-07: qty 1

## 2017-02-07 MED ORDER — CYCLOBENZAPRINE HCL 5 MG PO TABS: 5.0000 mg | ORAL_TABLET | Freq: Three times a day (TID) | ORAL | 0 refills | Status: DC | PRN

## 2017-02-07 NOTE — ED Notes (Signed)
See triage note  Presents with increased pain to feet  Ambulates slowly d/t increased pain

## 2017-02-07 NOTE — ED Provider Notes (Signed)
[facility] Emergency Department Provider Note ____________________________________________  Time seen: 1347  I have reviewed the triage vital signs and the nursing notes.  HISTORY  Chief Complaint  Peripheral Neuropathy  HPI Carrie Gardner is a 66 y.o. female presents to the ED for management of increased pain secondary to her to her chronic diabetic neuropathy and RLS. The patient describes poor sleep over the last 2 days secondary to increased pain. She reports that her Requip is not working as it used to. She reports being on that medic trials of gabapentin and pregabalin secondary to side effects. He admits to taking BC powder with some limited benefit. She denies any recent injury, accident, or trauma. She also denies any recent illness, fevers, chills, or sweats. She denies any distal paresthesias above baseline.  Past Medical History:  Diagnosis Date  . Allergy   . Arthritis   . Bradycardia   . Cataract   . Chronic kidney disease   . COPD (chronic obstructive pulmonary disease) (HCC)   . Diabetes mellitus without complication (HCC)   . Fibromyalgia   . Hypertension   . Opiate use 06/03/2015  . Sciatica   . Seizures (HCC)   . Sleep apnea   . Thyroid disease   . TIA (transient ischemic attack)   . Vitiligo     Patient Active Problem List   Diagnosis Date Noted  . Chronic pain 06/03/2015  . Long term current use of opiate analgesic 06/03/2015  . Long term prescription opiate use 06/03/2015  . Opiate use 06/03/2015  . Back pain at L4-L5 level 02/11/2015  . DDD (degenerative disc disease), lumbosacral 02/11/2015  . Lumbar radiculopathy 02/11/2015  . Morbid obesity (HCC) 08/13/2014  . Fibromyalgia 09/01/2012  . Restless leg 04/26/2010  . Esophagitis, reflux 12/24/2007  . Apnea, sleep 07/18/2006    Past Surgical History:  Procedure Laterality Date  . ABDOMINAL HYSTERECTOMY    . CHOLECYSTECTOMY    . LAPAROSCOPIC GASTRIC BAND REMOVAL WITH  LAPAROSCOPIC GASTRIC SLEEVE RESECTION    . LAPAROSCOPIC GASTRIC BANDING    . LEFT HEART CATH AND CORONARY ANGIOGRAPHY Left 11/08/2016   Procedure: Left Heart Cath and Coronary Angiography;  Surgeon: Marcina Millard, MD;  Location: [facility] INVASIVE CV LAB;  Service: Cardiovascular;  Laterality: Left;  . PACEMAKER INSERTION    . SPINE SURGERY      Prior to Admission medications   Medication Sig Start Date End Date Taking? Authorizing Provider  albuterol (PROVENTIL HFA;VENTOLIN HFA) 108 (90 BASE) MCG/ACT inhaler Inhale 2 puffs into the lungs every 6 (six) hours as needed for wheezing or shortness of breath. Reported on 07/26/2015    [provider]  aspirin 81 MG tablet Take 81 mg by mouth daily.    [provider]  Aspirin-Salicylamide-Caffeine (BC HEADACHE PO) Take 1 packet by mouth as needed.    [provider]  atorvastatin (LIPITOR) 20 MG tablet Take 20 mg by mouth daily. Reported on 07/26/2015    [provider]  cyclobenzaprine (FLEXERIL) 5 MG tablet Take 1 tablet (5 mg total) by mouth 3 (three) times daily as needed for muscle spasms. 02/07/17   Izzy Courville, Charlesetta Ivory, PA-C  diclofenac sodium (VOLTAREN) 1 % GEL Apply topically 4 (four) times daily.    [provider]  furosemide (LASIX) 20 MG tablet Take 20 mg by mouth daily.    [provider]  insulin aspart (NOVOLOG) 100 UNIT/ML injection Inject 20 Units into the skin 3 (three) times daily before  meals.     [provider]  insulin detemir (LEVEMIR) 100 UNIT/ML injection Inject 20-26 Units into the skin 2 (two) times daily. Take 26 units in the morning and 20 units at bedtime.    [provider]  levothyroxine (SYNTHROID, LEVOTHROID) 112 MCG tablet Take 112 mcg by mouth daily before breakfast. Reported on 11/18/2015    [provider]  lisinopril (PRINIVIL,ZESTRIL) 20 MG tablet Take 20 mg by mouth daily. Reported on 09/23/2015    [provider]  losartan  (COZAAR) 25 MG tablet Take 25 mg by mouth daily. 08/13/16   [provider]  metoprolol tartrate (LOPRESSOR) 25 MG tablet Take 25 mg by mouth 2 (two) times daily. 10/31/16 10/31/17  [provider]  omeprazole (PRILOSEC) 40 MG capsule Take 40 mg by mouth daily. 09/14/16 09/14/17  [provider]  ondansetron (ZOFRAN ODT) 4 MG disintegrating tablet Take 1 tablet (4 mg total) by mouth every 8 (eight) hours as needed for nausea or vomiting. 09/12/16   Rockne Menghini, MD  rOPINIRole (REQUIP) 0.25 MG tablet Take 1-4 tabs PO qHS prn RLS 11/01/16   [provider]  VICTOZA 18 MG/3ML SOPN Inject 1.2 mg into the skin daily. 08/22/16   [provider]    Allergies Metformin; Metformin and related; Pregabalin; Ibuprofen; Iodinated diagnostic agents; Lisinopril; Mobic [meloxicam]; Oxycodone; Oxycodone-acetaminophen; and Tramadol  Family History  Problem Relation Age of Onset  . Arthritis Mother   . Cancer Mother   . COPD Mother   . Diabetes Mother   . Vision loss Mother   . Heart disease Mother   . Hypertension Mother   . Kidney disease Mother   . Arthritis Father   . Asthma Father   . Depression Father   . Drug abuse Father   . Vision loss Father   . Hypertension Father   . Stroke Father     Social History Social History  Substance Use Topics  . Smoking status: Never Smoker  . Smokeless tobacco: Never Used  . Alcohol use 1.8 oz/week    3 Glasses of wine per week    Review of Systems  Constitutional: Negative for fever. Cardiovascular: Negative for chest pain. Respiratory: Negative for shortness of breath. Musculoskeletal: Negative for back pain. Bilateral leg Pain as above Skin: Negative for rash. Neurological: Negative for headaches, focal weakness or numbness. ____________________________________________  PHYSICAL EXAM:  VITAL SIGNS: ED Triage Vitals  Enc Vitals Group     BP 02/07/17 1248 130/64     Pulse Rate 02/07/17 1248 (!)  112     Resp 02/07/17 1246 16     Temp 02/07/17 1246 98.3 F (36.8 C)     Temp Source 02/07/17 1246 Oral     SpO2 02/07/17 1246 98 %     Weight 02/07/17 1247 240 lb (108.9 kg)     Height 02/07/17 1247 5\' 3"  (1.6 m)     Head Circumference --      Peak Flow --      Pain Score 02/07/17 1246 9     Pain Loc --      Pain Edu? --      Excl. in GC? --     Constitutional: Alert and oriented. Well appearing and in no distress. Head: Normocephalic and atraumatic. Cardiovascular: Normal rate, regular rhythm. Normal distal pulses. Respiratory: Normal respiratory effort. No wheezes/rales/rhonchi. Musculoskeletal: Nontender with normal range of motion in all extremities.  Neurologic:  Neil Crouch nerves II through XII grossly intact. Normal  LE DTRs bilatera Normal gait without ataxia. Normal speech and language. No gross focal neurologic deficits are appreciated. Skin:  Skin is warm, dry and intact. No rash noted. ____________________________________________  PROCEDURES  Methocarbamol 500 mg PO ____________________________________________  INITIAL IMPRESSION / ASSESSMENT AND PLAN / ED COURSE  Patient with ED evaluation of chronic pain related to her diabetic neuropathy and RLS. She denies any recent illness or injury. She reports inability to take medicines for neuropathic pain (pregabalin, gabapentin). She is discharged with a prescription for cyclobenzaprine to dose as needed for pain and sleep induction. She is referred to her neurologist, or she may establish care with [facility] neurology. She will return to the ED as needed.  ____________________________________________  FINAL CLINICAL IMPRESSION(S) / ED DIAGNOSES  Final diagnoses:  RLS (restless legs syndrome)  Other chronic pain      Carmelo Reidel, Charlesetta Ivory, PA-C 02/07/17 1823    Emily Filbert, MD 02/19/17 [phone]

## 2017-02-07 NOTE — ED Triage Notes (Signed)
Patient presents to the ED with increased diabetic neuropathy pain bilaterally in her legs x 2 days.  Patient states, "I haven't been able to sleep in 2 days."  Patient states, "I don't think my requip is working like it used to."  Patient ambulatory to triage without difficulty.  Patient states she has called her endocrinologist but her doctor has yet to call her back.

## 2017-02-07 NOTE — Discharge Instructions (Signed)
Take the medicine at bedtime for relief of leg pain and sleep induction. Do NOT drive after taking this medication. Follow-up with your providers if it helps your symptoms.

## 2017-02-28 ENCOUNTER — Encounter: Payer: Self-pay | Admitting: *Deleted

## 2017-03-01 ENCOUNTER — Encounter
Admission: RE | Disposition: A | Discharge status: Home / Self Care | Payer: Self-pay | Source: Ambulatory Visit | Attending: Gastroenterology

## 2017-03-01 ENCOUNTER — Ambulatory Visit
Admission: RE | Admit: 2017-03-01 | Discharge: 2017-03-01 | Disposition: A | Discharge status: Home / Self Care | Payer: Medicare Other | Source: Ambulatory Visit | Attending: Gastroenterology | Admitting: Gastroenterology

## 2017-03-01 ENCOUNTER — Ambulatory Visit: Payer: Medicare Other | Admitting: Anesthesiology

## 2017-03-01 ENCOUNTER — Encounter: Payer: Self-pay | Admitting: *Deleted

## 2017-03-01 ENCOUNTER — Ambulatory Visit: Payer: Medicare Other

## 2017-03-01 DIAGNOSIS — Z794 Long term (current) use of insulin: Secondary | ICD-10-CM | POA: Insufficient documentation

## 2017-03-01 DIAGNOSIS — K219 Gastro-esophageal reflux disease without esophagitis: Secondary | ICD-10-CM | POA: Diagnosis not present

## 2017-03-01 DIAGNOSIS — K5900 Constipation, unspecified: Secondary | ICD-10-CM | POA: Diagnosis present

## 2017-03-01 DIAGNOSIS — J449 Chronic obstructive pulmonary disease, unspecified: Secondary | ICD-10-CM | POA: Diagnosis not present

## 2017-03-01 DIAGNOSIS — R1030 Lower abdominal pain, unspecified: Secondary | ICD-10-CM

## 2017-03-01 DIAGNOSIS — G4730 Sleep apnea, unspecified: Secondary | ICD-10-CM | POA: Insufficient documentation

## 2017-03-01 DIAGNOSIS — Z5309 Procedure and treatment not carried out because of other contraindication: Secondary | ICD-10-CM | POA: Diagnosis not present

## 2017-03-01 DIAGNOSIS — E118 Type 2 diabetes mellitus with unspecified complications: Secondary | ICD-10-CM | POA: Diagnosis not present

## 2017-03-01 DIAGNOSIS — Z950 Presence of cardiac pacemaker: Secondary | ICD-10-CM | POA: Diagnosis not present

## 2017-03-01 DIAGNOSIS — R001 Bradycardia, unspecified: Secondary | ICD-10-CM | POA: Diagnosis not present

## 2017-03-01 DIAGNOSIS — I10 Essential (primary) hypertension: Secondary | ICD-10-CM | POA: Diagnosis not present

## 2017-03-01 HISTORY — DX: Presence of cardiac pacemaker: Z95.0

## 2017-03-01 HISTORY — DX: Hypothyroidism, unspecified: E03.9

## 2017-03-01 LAB — GLUCOSE, CAPILLARY: Glucose-Capillary: 257 mg/dL — ABNORMAL HIGH (ref 65–99)

## 2017-03-01 SURGERY — COLONOSCOPY WITH PROPOFOL
Anesthesia: General

## 2017-03-01 MED ORDER — PROPOFOL 500 MG/50ML IV EMUL
INTRAVENOUS | Status: AC
  Filled 2017-03-01: qty 50

## 2017-03-01 MED ORDER — SODIUM CHLORIDE 0.9 % IV SOLN: INTRAVENOUS | Status: DC

## 2017-03-01 MED ORDER — PHENYLEPHRINE HCL 10 MG/ML IJ SOLN
INTRAMUSCULAR | Status: AC
  Filled 2017-03-01: qty 1

## 2017-03-01 MED ORDER — LIDOCAINE HCL (PF) 2 % IJ SOLN
INTRAMUSCULAR | Status: AC
  Filled 2017-03-01: qty 10

## 2017-03-01 MED ORDER — MIDAZOLAM HCL 2 MG/2ML IJ SOLN
INTRAMUSCULAR | Status: AC
  Filled 2017-03-01: qty 2
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
## 2017-03-01 NOTE — Evaluation (Signed)
Patient presented for EGD and colonoscopy, reports she had no bowel movements after drinking prep yesterday.  She is in no distress, no abdominal pain.  Abdomen does not appear to be distended. RRR, CTA, Soft, obese non-tender, bowel sounds positive. DRE -unable to feel rectal vault due to length of the anal canal, some debris in the anal canal, normal otherwise. . Will obtain a 2 way abd film.

## 2017-03-01 NOTE — H&P (Signed)
Please see other notes from today.  Patient was given a tap water enema, with passage of some solid stool.  Patient with history of chronic constipation.  Unable to proceed with scopes today due to above/poor prep result.  I will start her on a moderate dose of linzess, increase as feasible, consider reglan, continue bid miralax and follow up this coming week.   Will arrange for procedures once bowel habits are improved.  Discussed with patient.

## 2017-03-01 NOTE — OR Nursing (Signed)
Tap water enema per rectum. Only able to infuse approximately 100cc before stating the urge to evacuate. Up to bathroom. Dr.Skulskie in to evaluate results. Decision made to cancel procedure due to presence of solid stool. Patient discharged via ambulation.

## 2017-03-06 ENCOUNTER — Other Ambulatory Visit: Payer: Self-pay | Admitting: Gastroenterology

## 2017-03-06 DIAGNOSIS — R1013 Epigastric pain: Secondary | ICD-10-CM

## 2017-03-06 DIAGNOSIS — R1084 Generalized abdominal pain: Secondary | ICD-10-CM

## 2017-03-06 DIAGNOSIS — K3184 Gastroparesis: Secondary | ICD-10-CM

## 2017-03-20 ENCOUNTER — Encounter
Admission: RE | Admit: 2017-03-20 | Discharge: 2017-03-20 | Disposition: A | Discharge status: Home / Self Care | Payer: Medicare Other | Source: Ambulatory Visit | Attending: Gastroenterology | Admitting: Gastroenterology

## 2017-03-20 DIAGNOSIS — R1084 Generalized abdominal pain: Secondary | ICD-10-CM | POA: Insufficient documentation

## 2017-03-20 DIAGNOSIS — K3184 Gastroparesis: Secondary | ICD-10-CM | POA: Insufficient documentation

## 2017-03-20 DIAGNOSIS — R1013 Epigastric pain: Secondary | ICD-10-CM | POA: Diagnosis present

## 2017-03-20 MED ORDER — TECHNETIUM TC 99M SULFUR COLLOID
2.7400 | Freq: Once | INTRAVENOUS | Status: AC | PRN
  Administered 2017-03-20: 2.74 via INTRAVENOUS

## 2017-06-17 DIAGNOSIS — G629 Polyneuropathy, unspecified: Secondary | ICD-10-CM | POA: Insufficient documentation

## 2017-06-20 DIAGNOSIS — Z041 Encounter for examination and observation following transport accident: Secondary | ICD-10-CM | POA: Insufficient documentation

## 2017-06-20 DIAGNOSIS — R0789 Other chest pain: Secondary | ICD-10-CM | POA: Insufficient documentation

## 2017-06-25 ENCOUNTER — Other Ambulatory Visit: Payer: Self-pay | Admitting: Family Medicine

## 2017-06-25 ENCOUNTER — Emergency Department
Admission: EM | Admit: 2017-06-25 | Discharge: 2017-06-25 | Disposition: A | Discharge status: Home / Self Care | Payer: Medicare Other | Attending: Student in an Organized Health Care Education/Training Program | Admitting: Student in an Organized Health Care Education/Training Program

## 2017-06-25 ENCOUNTER — Encounter: Payer: Self-pay | Admitting: Emergency Medicine

## 2017-06-25 ENCOUNTER — Other Ambulatory Visit: Payer: Self-pay

## 2017-06-25 DIAGNOSIS — J449 Chronic obstructive pulmonary disease, unspecified: Secondary | ICD-10-CM | POA: Insufficient documentation

## 2017-06-25 DIAGNOSIS — Z950 Presence of cardiac pacemaker: Secondary | ICD-10-CM | POA: Insufficient documentation

## 2017-06-25 DIAGNOSIS — E119 Type 2 diabetes mellitus without complications: Secondary | ICD-10-CM | POA: Insufficient documentation

## 2017-06-25 DIAGNOSIS — G44309 Post-traumatic headache, unspecified, not intractable: Secondary | ICD-10-CM | POA: Insufficient documentation

## 2017-06-25 DIAGNOSIS — Z794 Long term (current) use of insulin: Secondary | ICD-10-CM | POA: Insufficient documentation

## 2017-06-25 DIAGNOSIS — E2839 Other primary ovarian failure: Secondary | ICD-10-CM

## 2017-06-25 DIAGNOSIS — Z79899 Other long term (current) drug therapy: Secondary | ICD-10-CM | POA: Diagnosis not present

## 2017-06-25 DIAGNOSIS — E039 Hypothyroidism, unspecified: Secondary | ICD-10-CM | POA: Insufficient documentation

## 2017-06-25 DIAGNOSIS — Z7982 Long term (current) use of aspirin: Secondary | ICD-10-CM | POA: Diagnosis not present

## 2017-06-25 DIAGNOSIS — N189 Chronic kidney disease, unspecified: Secondary | ICD-10-CM | POA: Insufficient documentation

## 2017-06-25 DIAGNOSIS — R51 Headache: Secondary | ICD-10-CM | POA: Diagnosis present
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
## 2017-07-03 DIAGNOSIS — R519 Headache, unspecified: Secondary | ICD-10-CM | POA: Insufficient documentation

## 2017-07-03 DIAGNOSIS — R51 Headache: Secondary | ICD-10-CM

## 2017-08-12 ENCOUNTER — Other Ambulatory Visit: Payer: Self-pay

## 2017-08-12 ENCOUNTER — Encounter: Payer: Self-pay | Admitting: Nurse Practitioner

## 2017-08-12 ENCOUNTER — Ambulatory Visit: Payer: Medicare Other | Attending: Nurse Practitioner | Admitting: Nurse Practitioner

## 2017-08-12 VITALS — BP 130/77 | HR 103 | Temp 98.1°F | Resp 16 | Ht 62.5 in | Wt 227.0 lb

## 2017-08-12 DIAGNOSIS — K219 Gastro-esophageal reflux disease without esophagitis: Secondary | ICD-10-CM | POA: Insufficient documentation

## 2017-08-12 DIAGNOSIS — Z8673 Personal history of transient ischemic attack (TIA), and cerebral infarction without residual deficits: Secondary | ICD-10-CM | POA: Diagnosis not present

## 2017-08-12 DIAGNOSIS — M5442 Lumbago with sciatica, left side: Secondary | ICD-10-CM

## 2017-08-12 DIAGNOSIS — M545 Low back pain: Secondary | ICD-10-CM | POA: Insufficient documentation

## 2017-08-12 DIAGNOSIS — M79605 Pain in left leg: Secondary | ICD-10-CM | POA: Diagnosis present

## 2017-08-12 DIAGNOSIS — M542 Cervicalgia: Secondary | ICD-10-CM | POA: Diagnosis not present

## 2017-08-12 DIAGNOSIS — I2510 Atherosclerotic heart disease of native coronary artery without angina pectoris: Secondary | ICD-10-CM | POA: Diagnosis not present

## 2017-08-12 DIAGNOSIS — M5416 Radiculopathy, lumbar region: Secondary | ICD-10-CM | POA: Insufficient documentation

## 2017-08-12 DIAGNOSIS — M5441 Lumbago with sciatica, right side: Secondary | ICD-10-CM

## 2017-08-12 DIAGNOSIS — Z79891 Long term (current) use of opiate analgesic: Secondary | ICD-10-CM | POA: Diagnosis not present

## 2017-08-12 DIAGNOSIS — M797 Fibromyalgia: Secondary | ICD-10-CM | POA: Diagnosis not present

## 2017-08-12 DIAGNOSIS — Z79899 Other long term (current) drug therapy: Secondary | ICD-10-CM

## 2017-08-12 DIAGNOSIS — G8929 Other chronic pain: Secondary | ICD-10-CM

## 2017-08-12 DIAGNOSIS — M069 Rheumatoid arthritis, unspecified: Secondary | ICD-10-CM | POA: Insufficient documentation

## 2017-08-12 DIAGNOSIS — G894 Chronic pain syndrome: Secondary | ICD-10-CM | POA: Insufficient documentation

## 2017-08-12 DIAGNOSIS — Z789 Other specified health status: Secondary | ICD-10-CM | POA: Insufficient documentation

## 2017-08-12 DIAGNOSIS — M79604 Pain in right leg: Secondary | ICD-10-CM | POA: Diagnosis present

## 2017-08-12 DIAGNOSIS — G2581 Restless legs syndrome: Secondary | ICD-10-CM | POA: Diagnosis not present

## 2017-08-12 DIAGNOSIS — M5137 Other intervertebral disc degeneration, lumbosacral region: Secondary | ICD-10-CM | POA: Insufficient documentation

## 2017-08-12 DIAGNOSIS — R51 Headache: Secondary | ICD-10-CM | POA: Insufficient documentation

## 2017-08-12 DIAGNOSIS — R001 Bradycardia, unspecified: Secondary | ICD-10-CM | POA: Insufficient documentation

## 2017-08-12 DIAGNOSIS — E1142 Type 2 diabetes mellitus with diabetic polyneuropathy: Secondary | ICD-10-CM | POA: Insufficient documentation

## 2017-08-12 DIAGNOSIS — N189 Chronic kidney disease, unspecified: Secondary | ICD-10-CM | POA: Insufficient documentation

## 2017-08-12 DIAGNOSIS — M79602 Pain in left arm: Secondary | ICD-10-CM

## 2017-08-12 DIAGNOSIS — E119 Type 2 diabetes mellitus without complications: Secondary | ICD-10-CM | POA: Insufficient documentation

## 2017-08-12 DIAGNOSIS — R519 Headache, unspecified: Secondary | ICD-10-CM

## 2017-08-12 DIAGNOSIS — M79601 Pain in right arm: Secondary | ICD-10-CM

## 2017-08-12 DIAGNOSIS — M899 Disorder of bone, unspecified: Secondary | ICD-10-CM | POA: Insufficient documentation

## 2017-08-12 NOTE — Progress Notes (Signed)
Safety precautions to be maintained throughout the outpatient stay will include: orient to surroundings, keep bed in low position, maintain call bell within reach at all times, provide assistance with transfer out of bed and ambulation.  

## 2017-08-12 NOTE — Patient Instructions (Addendum)
Pt verbalized understanding of need to have xrays as ordered and med/psych evaluation completed before next appt. with pain clinic.  ____________________________________________________________________________________________  Appointment Policy Summary  It is our goal and responsibility to provide the medical community with assistance in the evaluation and management of patients with chronic pain. Unfortunately our resources are limited. Because we do not have an unlimited amount of time, or available appointments, we are required to closely monitor and manage their use. The following rules exist to maximize their use:  Patient's responsibilities: 1. Punctuality:  At what time should I arrive? You should be physically present in our office 30 minutes before your scheduled appointment. Your scheduled appointment is with your assigned healthcare provider. However, it takes 5-10 minutes to be "checked-in", and another 15 minutes for the nurses to do the admission. If you arrive to our office at the time you were given for your appointment, you will end up being at least 20-25 minutes late to your appointment with the provider. 2. Tardiness:  What happens if I arrive only a few minutes after my scheduled appointment time? You will need to reschedule your appointment. The cutoff is your appointment time. This is why it is so important that you arrive at least 30 minutes before that appointment. If you have an appointment scheduled for 10:00 AM and you arrive at 10:01, you will be required to reschedule your appointment.  3. Plan ahead:  Always assume that you will encounter traffic on your way in. Plan for it. If you are dependent on a driver, make sure they understand these rules and the need to arrive early. 4. Other appointments and responsibilities:  Avoid scheduling any other appointments before or after your pain clinic appointments.  5. Be prepared:  Write down everything that you need to  discuss with your healthcare provider and give this information to the admitting nurse. Write down the medications that you will need refilled. Bring your pills and bottles (even the empty ones), to all of your appointments, except for those where a procedure is scheduled. 6. No children or pets:  Find someone to take care of them. It is not appropriate to bring them in. 7. Scheduling changes:  We request "advanced notification" of any changes or cancellations. 8. Advanced notification:  Defined as a time period of more than 24 hours prior to the originally scheduled appointment. This allows for the appointment to be offered to other patients. 9. Rescheduling:  When a visit is rescheduled, it will require the cancellation of the original appointment. For this reason they both fall within the category of "Cancellations".  10. Cancellations:  They require advanced notification. Any cancellation less than 24 hours before the  appointment will be recorded as a "No Show". 11. No Show:  Defined as an unkept appointment where the patient failed to notify or declare to the practice their intention or inability to keep the appointment.  Corrective process for repeat offenders:  1. Tardiness: Three (3) episodes of rescheduling due to late arrivals will be recorded as one (1) "No Show". 2. Cancellation or reschedule: Three (3) cancellations or rescheduling will be recorded as one (1) "No Show". 3. "No Shows": Three (3) "No Shows" within a 12 month period will result in discharge from the practice. ____________________________________________________________________________________________  ____________________________________________________________________________________________  Pain Scale  Introduction: The pain score used by this practice is the Verbal Numerical Rating Scale (VNRS-11). This is an 11-point scale. It is for adults and children 10 years or older. There are  significant differences in  how the pain score is reported, used, and applied. Forget everything you learned in the past and learn this scoring system.  General Information: The scale should reflect your current level of pain. Unless you are specifically asked for the level of your worst pain, or your average pain. If you are asked for one of these two, then it should be understood that it is over the past 24 hours.  Basic Activities of Daily Living (ADL): Personal hygiene, dressing, eating, transferring, and using restroom.  Instructions: Most patients tend to report their level of pain as a combination of two factors, their physical pain and their psychosocial pain. This last one is also known as "suffering" and it is reflection of how physical pain affects you socially and psychologically. From now on, report them separately. From this point on, when asked to report your pain level, report only your physical pain. Use the following table for reference.  Pain Clinic Pain Levels (0-5/10)  Pain Level Score  Description  No Pain 0   Mild pain 1 Nagging, annoying, but does not interfere with basic activities of daily living (ADL). Patients are able to eat, bathe, get dressed, toileting (being able to get on and off the toilet and perform personal hygiene functions), transfer (move in and out of bed or a chair without assistance), and maintain continence (able to control bladder and bowel functions). Blood pressure and heart rate are unaffected. A normal heart rate for a healthy adult ranges from 60 to 100 bpm (beats per minute).   Mild to moderate pain 2 Noticeable and distracting. Impossible to hide from other people. More frequent flare-ups. Still possible to adapt and function close to normal. It can be very annoying and may have occasional stronger flare-ups. With discipline, patients may get used to it and adapt.   Moderate pain 3 Interferes significantly with activities of daily living (ADL). It becomes difficult to feed,  bathe, get dressed, get on and off the toilet or to perform personal hygiene functions. Difficult to get in and out of bed or a chair without assistance. Very distracting. With effort, it can be ignored when deeply involved in activities.   Moderately severe pain 4 Impossible to ignore for more than a few minutes. With effort, patients may still be able to manage work or participate in some social activities. Very difficult to concentrate. Signs of autonomic nervous system discharge are evident: dilated pupils (mydriasis); mild sweating (diaphoresis); sleep interference. Heart rate becomes elevated (>115 bpm). Diastolic blood pressure (lower number) rises above 100 mmHg. Patients find relief in laying down and not moving.   Severe pain 5 Intense and extremely unpleasant. Associated with frowning face and frequent crying. Pain overwhelms the senses.  Ability to do any activity or maintain social relationships becomes significantly limited. Conversation becomes difficult. Pacing back and forth is common, as getting into a comfortable position is nearly impossible. Pain wakes you up from deep sleep. Physical signs will be obvious: pupillary dilation; increased sweating; goosebumps; brisk reflexes; cold, clammy hands and feet; nausea, vomiting or dry heaves; loss of appetite; significant sleep disturbance with inability to fall asleep or to remain asleep. When persistent, significant weight loss is observed due to the complete loss of appetite and sleep deprivation.  Blood pressure and heart rate becomes significantly elevated. Caution: If elevated blood pressure triggers a pounding headache associated with blurred vision, then the patient should immediately seek attention at an urgent or emergency care unit, as these  may be signs of an impending stroke.    Emergency Department Pain Levels (6-10/10)  Emergency Room Pain 6 Severely limiting. Requires emergency care and should not be seen or managed at an  outpatient pain management facility. Communication becomes difficult and requires great effort. Assistance to reach the emergency department may be required. Facial flushing and profuse sweating along with potentially dangerous increases in heart rate and blood pressure will be evident.   Distressing pain 7 Self-care is very difficult. Assistance is required to transport, or use restroom. Assistance to reach the emergency department will be required. Tasks requiring coordination, such as bathing and getting dressed become very difficult.   Disabling pain 8 Self-care is no longer possible. At this level, pain is disabling. The individual is unable to do even the most "basic" activities such as walking, eating, bathing, dressing, transferring to a bed, or toileting. Fine motor skills are lost. It is difficult to think clearly.   Incapacitating pain 9 Pain becomes incapacitating. Thought processing is no longer possible. Difficult to remember your own name. Control of movement and coordination are lost.   The worst pain imaginable 10 At this level, most patients pass out from pain. When this level is reached, collapse of the autonomic nervous system occurs, leading to a sudden drop in blood pressure and heart rate. This in turn results in a temporary and dramatic drop in blood flow to the brain, leading to a loss of consciousness. Fainting is one of the body's self defense mechanisms. Passing out puts the brain in a calmed state and causes it to shut down for a while, in order to begin the healing process.    Summary: 1. Refer to this scale when providing [place] with your pain level. 2. Be accurate and careful when reporting your pain level. This will help with your care. 3. Over-reporting your pain level will lead to loss of credibility. 4. Even a level of 1/10 means that there is pain and will be treated at our facility. 5. High, inaccurate reporting will be documented as "Symptom Exaggeration", leading  to loss of credibility and suspicions of possible secondary gains such as obtaining more narcotics, or wanting to appear disabled, for fraudulent reasons. 6. Only pain levels of 5 or below will be seen at our facility. 7. Pain levels of 6 and above will be sent to the Emergency Department and the appointment cancelled. ____________________________________________________________________________________________   BMI Assessment: Estimated body mass index is 40.86 kg/m as calculated from the following:   Height as of this encounter: 5' 2.5" (1.588 m).   Weight as of this encounter: 227 lb (103 kg).  BMI interpretation table: BMI level Category Range association with higher incidence of chronic pain  <18 kg/m2 Underweight   18.5-24.9 kg/m2 Ideal body weight   25-29.9 kg/m2 Overweight Increased incidence by 20%  30-34.9 kg/m2 Obese (Class I) Increased incidence by 68%  35-39.9 kg/m2 Severe obesity (Class II) Increased incidence by 136%  >40 kg/m2 Extreme obesity (Class III) Increased incidence by 254%   BMI Readings from Last 4 Encounters:  08/12/17 40.86 kg/m  06/25/17 38.09 kg/m  03/01/17 42.51 kg/m  02/07/17 42.51 kg/m   Wt Readings from Last 4 Encounters:  08/12/17 227 lb (103 kg)  06/25/17 215 lb (97.5 kg)  03/01/17 240 lb (108.9 kg)  02/07/17 240 lb (108.9 kg)

## 2017-08-12 NOTE — Progress Notes (Signed)
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      Stability: No instability detected Muscle strength & Tone: Functionally intact Sensory: Unimpaired Palpation: Complains of area being tender to palpation       Provocative Tests: Lumbar Hyperextension and rotation test: Positive bilaterally for facet joint pain.bilateral leg raises positive less than 45 Patrick's Maneuver: Unable to perform                    Gait & Posture Assessment  Ambulation: Unassisted Gait: Antalgic Posture: WNL   Lower Extremity Exam    Side: Right lower extremity  Side: Left lower extremity  Inspection: No masses, redness, swelling, or asymmetry. No contractures  Inspection: No masses, redness, swelling, or asymmetry. No contractures  Functional ROM: Unrestricted ROM          Functional ROM: Unrestricted ROM          Muscle strength & Tone: Mild-to-moderate deconditioning  Muscle strength & Tone: Mild-to-moderate deconditioning  Sensory: Unimpaired  Sensory: Unimpaired  Palpation: No palpable anomalies  Palpation: No palpable anomalies   Assessment  Primary Diagnosis & Pertinent Problem List: The primary encounter diagnosis was Chronic nonintractable  headache, unspecified headache type. Diagnoses of Chronic bilateral low back pain with bilateral sciatica, Chronic pain of both lower extremities (Tertiary Area of Pain) (R>L), Chronic neck pain (Fourth Area of Pain) (R>L), Chronic pain of both upper extremities, Chronic pain syndrome, Long term current use of opiate analgesic, Pharmacologic therapy, Disorder of skeletal system, and Problems influencing health status were also pertinent to this visit.  Visit Diagnosis: 1. Chronic nonintractable headache, unspecified headache type   2. Chronic bilateral low back pain with bilateral sciatica   3. Chronic pain of both lower extremities (Tertiary Area of Pain) (R>L)   4. Chronic neck pain (Fourth Area of Pain) (R>L)   5. Chronic pain of both upper extremities   6. Chronic pain syndrome   7. Long term current use of opiate analgesic   8. Pharmacologic therapy   9. Disorder of skeletal system   10. Problems influencing health status    Plan of Care  Initial treatment plan:  Please be advised that as per protocol, today's visit has been an evaluation only. We have  not taken over the patient's controlled substance management.  Problem-specific plan: No problem-specific Assessment & Plan notes found for this encounter.  Ordered Lab-work, Procedure(s), Referral(s), & Consult(s): Orders Placed This Encounter  Procedures  . DG Cervical Spine With Flex & Extend  . DG Lumbar Spine Complete W/Bend  . DG Si Joints  . Compliance Drug Analysis, Ur  . Comp. Metabolic Panel (12)  . Magnesium  . Vitamin B12  . Sedimentation rate  . 25-Hydroxyvitamin D Lcms D2+D3  . C-reactive protein  . Ambulatory referral to Psychology   Pharmacotherapy: Medications ordered:  No orders of the defined types were placed in this encounter.  Medications administered during this visit: Zoey Bidwell. [place] had no medications administered during this visit.   Pharmacotherapy under consideration:  Opioid Analgesics: The  patient was informed that there is no guarantee that she would be a candidate for opioid analgesics. The decision will be made following CDC guidelines. This decision will be based on the results of diagnostic studies, as well as Ms. Pecor's risk profile.  Membrane stabilizer: To be determined at a later time Muscle relaxant: To be determined at a later time NSAID: To be determined at a later time Other analgesic(s): To be determined at a later time   Interventional therapies under consideration: Ms. Sedor was informed that there is no guarantee that she would be a candidate for interventional therapies. The decision will be based on the results of diagnostic studies, as well as Ms. Albee's risk profile.  Possible procedure(s): Diagnostic bilateral CESI Diagnostic bilateral LESI Diagnostic bilateral lumbar facet nerve block Possible bilateral lumbar facet RFA   Provider-requested follow-up: Return for 2nd Visit, after [facility] eval, xrays.  No future appointments.  Primary Care Physician: Ricardo Jericho, NP Location: [facility] Note by:  Date: 08/12/2017; Time: 11:39 AM  Pain Score Disclaimer: We use the NRS-11 scale. This is a self-reported, subjective measurement of pain severity with only modest accuracy. It is used primarily to identify changes within a particular patient. It must be understood that outpatient pain scales are significantly less accurate that those used for research, where they can be applied under ideal controlled circumstances with minimal exposure to variables. In reality, the score is likely to be a combination of pain intensity and pain affect, where pain affect describes the degree of emotional arousal or changes in action readiness caused by the sensory experience of pain. Factors such as social and work situation, setting, emotional state, anxiety levels, expectation, and prior pain experience may influence pain perception and  show large inter-individual differences that may also be affected by time variables.  Patient instructions provided during this appointment: Patient Instructions   Pt verbalized understanding of need to have xrays as ordered and med/psych evaluation completed before next appt. with pain clinic.  ____________________________________________________________________________________________  Appointment Policy Summary  It is our goal and responsibility to provide the medical community with assistance in the evaluation and management of patients with chronic pain. Unfortunately our resources are limited. Because we do not have an unlimited amount of time, or available appointments, we are required to closely monitor and manage their use. The following rules exist to maximize their use:  Patient's responsibilities: 1. Punctuality:  At what time should I arrive? You should be physically present in our office 30 minutes before your scheduled appointment. Your scheduled appointment is with your assigned healthcare provider. However, it takes 5-10 minutes to be "checked-in", and another 15 minutes for the nurses to  do the admission. If you arrive to our office at the time you were given for your appointment, you will end up being at least 20-25 minutes late to your appointment with the provider. 2. Tardiness:  What happens if I arrive only a few minutes after my scheduled appointment time? You will need to reschedule your appointment. The cutoff is your appointment time. This is why it is so important that you arrive at least 30 minutes before that appointment. If you have an appointment scheduled for 10:00 AM and you arrive at 10:01, you will be required to reschedule your appointment.  3. Plan ahead:  Always assume that you will encounter traffic on your way in. Plan for it. If you are dependent on a driver, make sure they understand these rules and the need to arrive early. 4. Other appointments and  responsibilities:  Avoid scheduling any other appointments before or after your pain clinic appointments.  5. Be prepared:  Write down everything that you need to discuss with your healthcare provider and give this information to the admitting nurse. Write down the medications that you will need refilled. Bring your pills and bottles (even the empty ones), to all of your appointments, except for those where a procedure is scheduled. 6. No children or pets:  Find someone to take care of them. It is not appropriate to bring them in. 7. Scheduling changes:  We request "advanced notification" of any changes or cancellations. 8. Advanced notification:  Defined as a time period of more than 24 hours prior to the originally scheduled appointment. This allows for the appointment to be offered to other patients. 9. Rescheduling:  When a visit is rescheduled, it will require the cancellation of the original appointment. For this reason they both fall within the category of "Cancellations".  10. Cancellations:  They require advanced notification. Any cancellation less than 24 hours before the  appointment will be recorded as a "No Show". 11. No Show:  Defined as an unkept appointment where the patient failed to notify or declare to the practice their intention or inability to keep the appointment.  Corrective process for repeat offenders:  1. Tardiness: Three (3) episodes of rescheduling due to late arrivals will be recorded as one (1) "No Show". 2. Cancellation or reschedule: Three (3) cancellations or rescheduling will be recorded as one (1) "No Show". 3. "No Shows": Three (3) "No Shows" within a 12 month period will result in discharge from the practice. ____________________________________________________________________________________________  ____________________________________________________________________________________________  Pain Scale  Introduction: The pain score used by this  practice is the Verbal Numerical Rating Scale (VNRS-11). This is an 11-point scale. It is for adults and children 10 years or older. There are significant differences in how the pain score is reported, used, and applied. Forget everything you learned in the past and learn this scoring system.  General Information: The scale should reflect your current level of pain. Unless you are specifically asked for the level of your worst pain, or your average pain. If you are asked for one of these two, then it should be understood that it is over the past 24 hours.  Basic Activities of Daily Living (ADL): Personal hygiene, dressing, eating, transferring, and using restroom.  Instructions: Most patients tend to report their level of pain as a combination of two factors, their physical pain and their psychosocial pain. This last one is also known as "suffering" and it is reflection of how physical pain affects you socially and psychologically. From now  on, report them separately. From this point on, when asked to report your pain level, report only your physical pain. Use the following table for reference.  Pain Clinic Pain Levels (0-5/10)  Pain Level Score  Description  No Pain 0   Mild pain 1 Nagging, annoying, but does not interfere with basic activities of daily living (ADL). Patients are able to eat, bathe, get dressed, toileting (being able to get on and off the toilet and perform personal hygiene functions), transfer (move in and out of bed or a chair without assistance), and maintain continence (able to control bladder and bowel functions). Blood pressure and heart rate are unaffected. A normal heart rate for a healthy adult ranges from 60 to 100 bpm (beats per minute).   Mild to moderate pain 2 Noticeable and distracting. Impossible to hide from other people. More frequent flare-ups. Still possible to adapt and function close to normal. It can be very annoying and may have occasional stronger flare-ups.  With discipline, patients may get used to it and adapt.   Moderate pain 3 Interferes significantly with activities of daily living (ADL). It becomes difficult to feed, bathe, get dressed, get on and off the toilet or to perform personal hygiene functions. Difficult to get in and out of bed or a chair without assistance. Very distracting. With effort, it can be ignored when deeply involved in activities.   Moderately severe pain 4 Impossible to ignore for more than a few minutes. With effort, patients may still be able to manage work or participate in some social activities. Very difficult to concentrate. Signs of autonomic nervous system discharge are evident: dilated pupils (mydriasis); mild sweating (diaphoresis); sleep interference. Heart rate becomes elevated (>115 bpm). Diastolic blood pressure (lower number) rises above 100 mmHg. Patients find relief in laying down and not moving.   Severe pain 5 Intense and extremely unpleasant. Associated with frowning face and frequent crying. Pain overwhelms the senses.  Ability to do any activity or maintain social relationships becomes significantly limited. Conversation becomes difficult. Pacing back and forth is common, as getting into a comfortable position is nearly impossible. Pain wakes you up from deep sleep. Physical signs will be obvious: pupillary dilation; increased sweating; goosebumps; brisk reflexes; cold, clammy hands and feet; nausea, vomiting or dry heaves; loss of appetite; significant sleep disturbance with inability to fall asleep or to remain asleep. When persistent, significant weight loss is observed due to the complete loss of appetite and sleep deprivation.  Blood pressure and heart rate becomes significantly elevated. Caution: If elevated blood pressure triggers a pounding headache associated with blurred vision, then the patient should immediately seek attention at an urgent or emergency care unit, as these may be signs of an impending  stroke.    Emergency Department Pain Levels (6-10/10)  Emergency Room Pain 6 Severely limiting. Requires emergency care and should not be seen or managed at an outpatient pain management facility. Communication becomes difficult and requires great effort. Assistance to reach the emergency department may be required. Facial flushing and profuse sweating along with potentially dangerous increases in heart rate and blood pressure will be evident.   Distressing pain 7 Self-care is very difficult. Assistance is required to transport, or use restroom. Assistance to reach the emergency department will be required. Tasks requiring coordination, such as bathing and getting dressed become very difficult.   Disabling pain 8 Self-care is no longer possible. At this level, pain is disabling. The individual is unable to do even the most "  basic" activities such as walking, eating, bathing, dressing, transferring to a bed, or toileting. Fine motor skills are lost. It is difficult to think clearly.   Incapacitating pain 9 Pain becomes incapacitating. Thought processing is no longer possible. Difficult to remember your own name. Control of movement and coordination are lost.   The worst pain imaginable 10 At this level, most patients pass out from pain. When this level is reached, collapse of the autonomic nervous system occurs, leading to a sudden drop in blood pressure and heart rate. This in turn results in a temporary and dramatic drop in blood flow to the brain, leading to a loss of consciousness. Fainting is one of the body's self defense mechanisms. Passing out puts the brain in a calmed state and causes it to shut down for a while, in order to begin the healing process.    Summary: 1. Refer to this scale when providing [place] with your pain level. 2. Be accurate and careful when reporting your pain level. This will help with your care. 3. Over-reporting your pain level will lead to loss of credibility. 4. Even  a level of 1/10 means that there is pain and will be treated at our facility. 5. High, inaccurate reporting will be documented as "Symptom Exaggeration", leading to loss of credibility and suspicions of possible secondary gains such as obtaining more narcotics, or wanting to appear disabled, for fraudulent reasons. 6. Only pain levels of 5 or below will be seen at our facility. 7. Pain levels of 6 and above will be sent to the Emergency Department and the appointment cancelled. ____________________________________________________________________________________________   BMI Assessment: Estimated body mass index is 40.86 kg/m as calculated from the following:   Height as of this encounter: 5' 2.5" (1.588 m).   Weight as of this encounter: 227 lb (103 kg).  BMI interpretation table: BMI level Category Range association with higher incidence of chronic pain  <18 kg/m2 Underweight   18.5-24.9 kg/m2 Ideal body weight   25-29.9 kg/m2 Overweight Increased incidence by 20%  30-34.9 kg/m2 Obese (Class I) Increased incidence by 68%  35-39.9 kg/m2 Severe obesity (Class II) Increased incidence by 136%  >40 kg/m2 Extreme obesity (Class III) Increased incidence by 254%   BMI Readings from Last 4 Encounters:  08/12/17 40.86 kg/m  06/25/17 38.09 kg/m  03/01/17 42.51 kg/m  02/07/17 42.51 kg/m   Wt Readings from Last 4 Encounters:  08/12/17 227 lb (103 kg)  06/25/17 215 lb (97.5 kg)  03/01/17 240 lb (108.9 kg)  02/07/17 240 lb (108.9 kg)

## 2017-08-16 ENCOUNTER — Ambulatory Visit
Admission: RE | Admit: 2017-08-16 | Discharge: 2017-08-16 | Disposition: A | Discharge status: Home / Self Care | Payer: Medicare Other | Source: Ambulatory Visit | Attending: Nurse Practitioner | Admitting: Nurse Practitioner

## 2017-08-16 DIAGNOSIS — M5442 Lumbago with sciatica, left side: Secondary | ICD-10-CM

## 2017-08-16 DIAGNOSIS — G8929 Other chronic pain: Secondary | ICD-10-CM | POA: Insufficient documentation

## 2017-08-16 DIAGNOSIS — M533 Sacrococcygeal disorders, not elsewhere classified: Secondary | ICD-10-CM | POA: Insufficient documentation

## 2017-08-16 DIAGNOSIS — M5441 Lumbago with sciatica, right side: Secondary | ICD-10-CM

## 2017-08-16 DIAGNOSIS — M79604 Pain in right leg: Secondary | ICD-10-CM | POA: Diagnosis not present

## 2017-08-16 DIAGNOSIS — M79605 Pain in left leg: Secondary | ICD-10-CM | POA: Insufficient documentation

## 2017-08-16 DIAGNOSIS — M47812 Spondylosis without myelopathy or radiculopathy, cervical region: Secondary | ICD-10-CM | POA: Diagnosis not present

## 2017-08-16 DIAGNOSIS — M542 Cervicalgia: Principal | ICD-10-CM

## 2017-08-16 LAB — COMPLIANCE DRUG ANALYSIS, UR

## 2017-08-18 LAB — 25-HYDROXYVITAMIN D LCMS D2+D3
25-HYDROXY, VITAMIN D-3: 7.1 ng/mL
25-HYDROXY, VITAMIN D: 14 ng/mL — AB

## 2017-08-18 LAB — COMP. METABOLIC PANEL (12)
ALK PHOS: 79 IU/L (ref 39–117)
AST: 14 IU/L (ref 0–40)
Albumin/Globulin Ratio: 1.6 (ref 1.2–2.2)
Albumin: 4.1 g/dL (ref 3.6–4.8)
BUN/Creatinine Ratio: 16 (ref 12–28)
BUN: 18 mg/dL (ref 8–27)
Bilirubin Total: 0.3 mg/dL (ref 0.0–1.2)
CALCIUM: 9.3 mg/dL (ref 8.7–10.3)
Chloride: 104 mmol/L (ref 96–106)
Creatinine, Ser: 1.11 mg/dL — ABNORMAL HIGH (ref 0.57–1.00)
GFR, EST AFRICAN AMERICAN: 60 mL/min/{1.73_m2} (ref >59)
GFR, EST NON AFRICAN AMERICAN: 52 mL/min/{1.73_m2} — AB (ref >59)
GLUCOSE: 267 mg/dL — AB (ref 65–99)
Globulin, Total: 2.5 g/dL (ref 1.5–4.5)
POTASSIUM: 4.3 mmol/L (ref 3.5–5.2)
Sodium: 142 mmol/L (ref 134–144)
Total Protein: 6.6 g/dL (ref 6.0–8.5)

## 2017-08-18 LAB — MAGNESIUM: Magnesium: 2.1 mg/dL (ref 1.6–2.3)

## 2017-08-18 LAB — SEDIMENTATION RATE: SED RATE: 23 mm/h (ref 0–40)

## 2017-08-18 LAB — VITAMIN B12: VITAMIN B 12: 272 pg/mL (ref 232–1245)

## 2017-08-18 LAB — 25-HYDROXY VITAMIN D LCMS D2+D3: 25-Hydroxy, Vitamin D-2: 7 ng/mL

## 2017-08-18 LAB — C-REACTIVE PROTEIN: CRP: 0.9 mg/L (ref 0.0–4.9)

## 2017-08-21 NOTE — Progress Notes (Signed)
Results were reviewed and found to be: mildly abnormal  No acute injury or pathology identified  Review would suggest interventional pain management techniques may be of benefit 

## 2017-08-21 NOTE — Progress Notes (Signed)
Results were reviewed and found to be: abnormal  No acute injury or pathology identified  Review would suggest interventional pain management techniques may be of benefit

## 2017-08-26 ENCOUNTER — Ambulatory Visit
Admission: RE | Admit: 2017-08-26 | Discharge: 2017-08-26 | Disposition: A | Discharge status: Home / Self Care | Payer: Medicare Other | Source: Ambulatory Visit | Attending: Pain Medicine | Admitting: Pain Medicine

## 2017-08-26 ENCOUNTER — Other Ambulatory Visit: Payer: Self-pay

## 2017-08-26 ENCOUNTER — Ambulatory Visit: Payer: Medicare Other | Attending: Pain Medicine | Admitting: Pain Medicine

## 2017-08-26 ENCOUNTER — Encounter: Payer: Self-pay | Admitting: Pain Medicine

## 2017-08-26 VITALS — BP 138/85 | HR 118 | Temp 97.8°F | Ht 62.0 in | Wt 220.0 lb

## 2017-08-26 DIAGNOSIS — K2100 Gastro-esophageal reflux disease with esophagitis, without bleeding: Secondary | ICD-10-CM

## 2017-08-26 DIAGNOSIS — M5137 Other intervertebral disc degeneration, lumbosacral region: Secondary | ICD-10-CM | POA: Diagnosis not present

## 2017-08-26 DIAGNOSIS — M79601 Pain in right arm: Secondary | ICD-10-CM

## 2017-08-26 DIAGNOSIS — M5442 Lumbago with sciatica, left side: Secondary | ICD-10-CM

## 2017-08-26 DIAGNOSIS — M5414 Radiculopathy, thoracic region: Secondary | ICD-10-CM | POA: Insufficient documentation

## 2017-08-26 DIAGNOSIS — M5116 Intervertebral disc disorders with radiculopathy, lumbar region: Secondary | ICD-10-CM | POA: Diagnosis not present

## 2017-08-26 DIAGNOSIS — E559 Vitamin D deficiency, unspecified: Secondary | ICD-10-CM

## 2017-08-26 DIAGNOSIS — M5136 Other intervertebral disc degeneration, lumbar region: Secondary | ICD-10-CM | POA: Diagnosis not present

## 2017-08-26 DIAGNOSIS — R51 Headache: Secondary | ICD-10-CM

## 2017-08-26 DIAGNOSIS — G894 Chronic pain syndrome: Secondary | ICD-10-CM

## 2017-08-26 DIAGNOSIS — M5030 Other cervical disc degeneration, unspecified cervical region: Secondary | ICD-10-CM | POA: Diagnosis not present

## 2017-08-26 DIAGNOSIS — M549 Dorsalgia, unspecified: Principal | ICD-10-CM

## 2017-08-26 DIAGNOSIS — G8929 Other chronic pain: Secondary | ICD-10-CM

## 2017-08-26 DIAGNOSIS — M159 Polyosteoarthritis, unspecified: Secondary | ICD-10-CM | POA: Insufficient documentation

## 2017-08-26 DIAGNOSIS — E1342 Other specified diabetes mellitus with diabetic polyneuropathy: Secondary | ICD-10-CM | POA: Diagnosis not present

## 2017-08-26 DIAGNOSIS — M51369 Other intervertebral disc degeneration, lumbar region without mention of lumbar back pain or lower extremity pain: Secondary | ICD-10-CM | POA: Insufficient documentation

## 2017-08-26 DIAGNOSIS — G4486 Cervicogenic headache: Secondary | ICD-10-CM

## 2017-08-26 DIAGNOSIS — M47816 Spondylosis without myelopathy or radiculopathy, lumbar region: Secondary | ICD-10-CM | POA: Insufficient documentation

## 2017-08-26 DIAGNOSIS — M4310 Spondylolisthesis, site unspecified: Secondary | ICD-10-CM

## 2017-08-26 DIAGNOSIS — M797 Fibromyalgia: Secondary | ICD-10-CM

## 2017-08-26 DIAGNOSIS — M5416 Radiculopathy, lumbar region: Secondary | ICD-10-CM

## 2017-08-26 DIAGNOSIS — M79602 Pain in left arm: Secondary | ICD-10-CM | POA: Diagnosis not present

## 2017-08-26 DIAGNOSIS — M47817 Spondylosis without myelopathy or radiculopathy, lumbosacral region: Secondary | ICD-10-CM | POA: Diagnosis not present

## 2017-08-26 DIAGNOSIS — M47818 Spondylosis without myelopathy or radiculopathy, sacral and sacrococcygeal region: Secondary | ICD-10-CM

## 2017-08-26 DIAGNOSIS — M79604 Pain in right leg: Secondary | ICD-10-CM

## 2017-08-26 DIAGNOSIS — E1142 Type 2 diabetes mellitus with diabetic polyneuropathy: Secondary | ICD-10-CM

## 2017-08-26 DIAGNOSIS — M542 Cervicalgia: Secondary | ICD-10-CM

## 2017-08-26 DIAGNOSIS — M545 Low back pain: Secondary | ICD-10-CM | POA: Diagnosis present

## 2017-08-26 DIAGNOSIS — M150 Primary generalized (osteo)arthritis: Secondary | ICD-10-CM

## 2017-08-26 DIAGNOSIS — M4316 Spondylolisthesis, lumbar region: Secondary | ICD-10-CM | POA: Insufficient documentation

## 2017-08-26 DIAGNOSIS — M533 Sacrococcygeal disorders, not elsewhere classified: Secondary | ICD-10-CM | POA: Diagnosis not present

## 2017-08-26 DIAGNOSIS — M79605 Pain in left leg: Secondary | ICD-10-CM

## 2017-08-26 DIAGNOSIS — M5441 Lumbago with sciatica, right side: Secondary | ICD-10-CM

## 2017-08-26 MED ORDER — ERGOCALCIFEROL 1.25 MG (50000 UT) PO CAPS: 50000.0000 [IU] | ORAL_CAPSULE | ORAL | 0 refills | Status: AC

## 2017-08-26 MED ORDER — GNP CALCIUM 1200 1200-1000 MG-UNIT PO CHEW
1200.0000 mg | CHEWABLE_TABLET | Freq: Every day | ORAL | 5 refills | Status: DC
Start: 2017-08-26 — End: 2017-12-02

## 2017-08-26 MED ORDER — VITAMIN D3 125 MCG (5000 UT) PO CAPS: 1.0000 | ORAL_CAPSULE | Freq: Every day | ORAL | 5 refills | Status: DC

## 2017-08-26 MED ORDER — MAGNESIUM 500 MG PO CAPS: 500.0000 mg | ORAL_CAPSULE | Freq: Two times a day (BID) | ORAL | 5 refills | Status: DC

## 2017-08-26 MED ORDER — HYDROCODONE-ACETAMINOPHEN 5-325 MG PO TABS: 1.0000 | ORAL_TABLET | Freq: Two times a day (BID) | ORAL | 0 refills | Status: DC | PRN

## 2017-08-26 NOTE — Progress Notes (Signed)
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 intact. No obvious neuro-muscular anomalies detected.  Muscle Tone/Strength: Functionally intact. No obvious neuro-muscular anomalies detected.  Sensory (Neurological): Unimpaired          Sensory (Neurological): Unimpaired          Palpation: No palpable anomalies              Palpation: No palpable anomalies              Specialized Test(s): Deferred         Specialized Test(s): Deferred          Thoracic Spine Area Exam  Skin & Axial Inspection: No masses, redness, or swelling Alignment: Symmetrical Functional ROM: Unrestricted ROM Stability: No instability detected Muscle Tone/Strength: Functionally intact. No obvious neuro-muscular anomalies detected. Sensory (Neurological): Unimpaired Muscle strength & Tone: No palpable anomalies  Lumbar Spine Area Exam  Skin & Axial Inspection: No masses, redness, or swelling Alignment: Symmetrical Functional  ROM: Decreased ROM      Stability: No instability detected Muscle Tone/Strength: Functionally intact. No obvious neuro-muscular anomalies detected. Sensory (Neurological): Movement-associated pain Palpation: Complains of area being tender to palpation       Provocative Tests: Lumbar Hyperextension and rotation test: Positive bilaterally for facet joint pain. Lumbar Lateral bending test: evaluation deferred today       Patrick's Maneuver: evaluation deferred today                    Gait & Posture Assessment  Ambulation: Patient ambulates using a cane Gait: Limited. Using assistive device to ambulate Posture: Difficulty standing up straight, due to pain   Lower Extremity Exam    Side: Right lower extremity  Side: Left lower extremity  Skin & Extremity Inspection: Skin color, temperature, and hair growth are WNL. No peripheral edema or cyanosis. No masses, redness, swelling, asymmetry, or associated skin lesions. No contractures.  Skin & Extremity Inspection: Skin color, temperature, and hair growth are WNL. No peripheral edema or cyanosis. No masses, redness, swelling, asymmetry, or associated skin lesions. No contractures.  Functional ROM: Unrestricted ROM          Functional ROM: Unrestricted ROM          Muscle Tone/Strength: Functionally intact. No obvious neuro-muscular anomalies detected.  Muscle Tone/Strength: Functionally intact. No obvious neuro-muscular anomalies detected.  Sensory (Neurological): Unimpaired  Sensory (Neurological): Unimpaired  Palpation: No palpable anomalies  Palpation: No palpable anomalies   Assessment & Plan  Primary Diagnosis & Pertinent Problem List: The primary encounter diagnosis was Chronic pain syndrome. 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), Chronic sacroiliac joint pain (Bilateral), Chronic lower extremity pain (Tertiary Area of Pain) (Bilateral) (  R>L), Chronic lumbar radiculopathy, Chronic neck pain (Fourth Area of Pain) (Bilateral) (R>L), Chronic pain of both upper extremities, Osteoarthritis, Fibromyalgia, Diabetic polyneuropathy associated with type 2 diabetes mellitus ([place]), Vitamin D deficiency, Gastro-esophageal reflux disease with esophagitis, Chronic upper back pain, Thoracic radicular pain (T10) (Bilateral), and Spondylosis without myelopathy or radiculopathy, lumbosacral region were also pertinent to this visit.  Visit Diagnosis: 1. Chronic pain syndrome   2. Cervicogenic headache   3. DDD (degenerative disc disease), cervical   4. Chronic low back pain (Secondary Area of Pain) (Bilateral) (R>L) with bilateral sciatica   5. Lumbar Facet Syndrome (Bilateral)   6. DDD (degenerative disc disease), lumbosacral   7. DDD (degenerative disc disease), lumbar   8. 12 mm Anterolisthesis of L4 over L5   9. 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 Chronic sacroiliac joint pain (Bilateral)   11. Chronic lower extremity pain (Tertiary Area of Pain) (Bilateral) (R>L)   12. Chronic lumbar radiculopathy   13. Chronic neck pain (Fourth Area of Pain) (Bilateral) (R>L)   14. Chronic pain of both upper extremities   15. Osteoarthritis   16. Fibromyalgia   17. Diabetic polyneuropathy associated with type 2 diabetes mellitus ([place])   18. Vitamin D deficiency   19. Gastro-esophageal reflux disease with esophagitis   20. Chronic upper back pain   21. Thoracic radicular pain (T10) (Bilateral)   22. Spondylosis without myelopathy or radiculopathy, lumbosacral region    Problems updated and reviewed during this visit: No problems updated.  Plan of Care  Pharmacotherapy (Medications  Ordered): Meds ordered this encounter  Medications  . ergocalciferol (VITAMIN D2) 50000 units capsule    Sig: Take 1 capsule (50,000 Units total) by mouth 2 (two) times a week. X 6 weeks.    Dispense:  12 capsule    Refill:  0    Do not add this medication to the electronic "Automatic Refill" notification system. Patient may have prescription filled one day early if pharmacy is closed on scheduled refill date.  . Cholecalciferol (VITAMIN D3) 5000 units CAPS    Sig: Take 1 capsule (5,000 Units total) by mouth daily with breakfast. Take along with calcium and magnesium.    Dispense:  30 capsule    Refill:  5    Do not place medication on "Automatic Refill".  May substitute with similar over-the-counter product.  . Magnesium 500 MG CAPS    Sig: Take 1 capsule (500 mg total) by mouth 2 (two) times daily at 8 am and 10 pm.    Dispense:  60 capsule    Refill:  5    Do not place medication on "Automatic Refill".  The patient may use similar over-the-counter product.  . Calcium Carbonate-Vit D-Min (GNP CALCIUM 1200) 1200-1000 MG-UNIT CHEW    Sig: Chew 1,200 mg by mouth daily with breakfast. Take in combination with vitamin D and magnesium.    Dispense:  30 tablet    Refill:  5    Do not place medication on "Automatic Refill".  May substitute with similar over-the-counter product.  Marland Kitchen HYDROcodone-acetaminophen (NORCO/VICODIN) 5-325 MG tablet    Sig: Take 1 tablet by mouth 2 (two) times daily as needed for severe pain.    Dispense:  60 tablet    Refill:  0    Do not place this medication, or any other prescription from our practice, on "Automatic Refill". Patient may have prescription filled one day early if pharmacy is closed on scheduled refill date. Do not fill  until: 08/26/17 To last until: 09/25/17    Procedure Orders     LUMBAR FACET(MEDIAL BRANCH NERVE BLOCK) MB[facility] Lab Orders  No laboratory test(s) ordered today    Imaging Orders     DG Thoracic Spine 2 View Referral Orders  No  referral(s) requested today    Pharmacological management options:  Opioid Analgesics: We'll take over management today. See above orders Membrane stabilizer: We have discussed the possibility of optimizing this mode of therapy, if tolerated Muscle relaxant: We have discussed the possibility of a trial NSAID: We have discussed the possibility of a trial Other analgesic(s): To be determined at a later time   Interventional management options: Planned, scheduled, and/or pending:    Diagnostic bilateral lumbar facet block #1 under fluoroscopic guidance and IV sedation X-rays of the thoracic spine.   Considering:   Diagnostic midline CESI  Diagnostic bilateral cervical facet block  Possible bilateral cervical facet RFA  Diagnostic bilateral lumbar facet nerve block  Possible bilateral lumbar facet RFA  Diagnostic bilateral L4 transforaminal LESI  Diagnostic right-sided L5-S1 LESI  Diagnostic right-sided caudal epidural steroid injection    PRN Procedures:   None at this time   Provider-requested follow-up: Return for Procedure (w/ sedation): (B) L-FCT BLK #1.  Future Appointments  Date Time Provider [place]  09/03/2017  9:15 AM Milinda Pointer, MD [facility] None    Primary Care Physician: Ricardo Jericho, NP Location: [facility] Note by: Gaspar Cola, MD Date: 08/26/2017; Time: 12:19 PM

## 2017-08-26 NOTE — Patient Instructions (Addendum)
____________________________________________________________________________________________  Prescription given for hydrocodone/acetaminophen x1 Pain Scale  Introduction: The pain score used by this practice is the Verbal Numerical Rating Scale (VNRS-11). This is an 11-point scale. It is for adults and children 10 years or older. There are significant differences in how the pain score is reported, used, and applied. Forget everything you learned in the past and learn this scoring system.  General Information: The scale should reflect your current level of pain. Unless you are specifically asked for the level of your worst pain, or your average pain. If you are asked for one of these two, then it should be understood that it is over the past 24 hours.  Basic Activities of Daily Living (ADL): Personal hygiene, dressing, eating, transferring, and using restroom.  Instructions: Most patients tend to report their level of pain as a combination of two factors, their physical pain and their psychosocial pain. This last one is also known as "suffering" and it is reflection of how physical pain affects you socially and psychologically. From now on, report them separately. From this point on, when asked to report your pain level, report only your physical pain. Use the following table for reference.  Pain Clinic Pain Levels (0-5/10)  Pain Level Score  Description  No Pain 0   Mild pain 1 Nagging, annoying, but does not interfere with basic activities of daily living (ADL). Patients are able to eat, bathe, get dressed, toileting (being able to get on and off the toilet and perform personal hygiene functions), transfer (move in and out of bed or a chair without assistance), and maintain continence (able to control bladder and bowel functions). Blood pressure and heart rate are unaffected. A normal heart rate for a healthy adult ranges from 60 to 100 bpm (beats per minute).   Mild to moderate pain 2 Noticeable  and distracting. Impossible to hide from other people. More frequent flare-ups. Still possible to adapt and function close to normal. It can be very annoying and may have occasional stronger flare-ups. With discipline, patients may get used to it and adapt.   Moderate pain 3 Interferes significantly with activities of daily living (ADL). It becomes difficult to feed, bathe, get dressed, get on and off the toilet or to perform personal hygiene functions. Difficult to get in and out of bed or a chair without assistance. Very distracting. With effort, it can be ignored when deeply involved in activities.   Moderately severe pain 4 Impossible to ignore for more than a few minutes. With effort, patients may still be able to manage work or participate in some social activities. Very difficult to concentrate. Signs of autonomic nervous system discharge are evident: dilated pupils (mydriasis); mild sweating (diaphoresis); sleep interference. Heart rate becomes elevated (>115 bpm). Diastolic blood pressure (lower number) rises above 100 mmHg. Patients find relief in laying down and not moving.   Severe pain 5 Intense and extremely unpleasant. Associated with frowning face and frequent crying. Pain overwhelms the senses.  Ability to do any activity or maintain social relationships becomes significantly limited. Conversation becomes difficult. Pacing back and forth is common, as getting into a comfortable position is nearly impossible. Pain wakes you up from deep sleep. Physical signs will be obvious: pupillary dilation; increased sweating; goosebumps; brisk reflexes; cold, clammy hands and feet; nausea, vomiting or dry heaves; loss of appetite; significant sleep disturbance with inability to fall asleep or to remain asleep. When persistent, significant weight loss is observed due to the complete loss of appetite  and sleep deprivation.  Blood pressure and heart rate becomes significantly elevated. Caution: If elevated  blood pressure triggers a pounding headache associated with blurred vision, then the patient should immediately seek attention at an urgent or emergency care unit, as these may be signs of an impending stroke.    Emergency Department Pain Levels (6-10/10)  Emergency Room Pain 6 Severely limiting. Requires emergency care and should not be seen or managed at an outpatient pain management facility. Communication becomes difficult and requires great effort. Assistance to reach the emergency department may be required. Facial flushing and profuse sweating along with potentially dangerous increases in heart rate and blood pressure will be evident.   Distressing pain 7 Self-care is very difficult. Assistance is required to transport, or use restroom. Assistance to reach the emergency department will be required. Tasks requiring coordination, such as bathing and getting dressed become very difficult.   Disabling pain 8 Self-care is no longer possible. At this level, pain is disabling. The individual is unable to do even the most "basic" activities such as walking, eating, bathing, dressing, transferring to a bed, or toileting. Fine motor skills are lost. It is difficult to think clearly.   Incapacitating pain 9 Pain becomes incapacitating. Thought processing is no longer possible. Difficult to remember your own name. Control of movement and coordination are lost.   The worst pain imaginable 10 At this level, most patients pass out from pain. When this level is reached, collapse of the autonomic nervous system occurs, leading to a sudden drop in blood pressure and heart rate. This in turn results in a temporary and dramatic drop in blood flow to the brain, leading to a loss of consciousness. Fainting is one of the body's self defense mechanisms. Passing out puts the brain in a calmed state and causes it to shut down for a while, in order to begin the healing process.    Summary: 1. Refer to this scale when  providing [place] with your pain level. 2. Be accurate and careful when reporting your pain level. This will help with your care. 3. Over-reporting your pain level will lead to loss of credibility. 4. Even a level of 1/10 means that there is pain and will be treated at our facility. 5. High, inaccurate reporting will be documented as "Symptom Exaggeration", leading to loss of credibility and suspicions of possible secondary gains such as obtaining more narcotics, or wanting to appear disabled, for fraudulent reasons. 6. Only pain levels of 5 or below will be seen at our facility. 7. Pain levels of 6 and above will be sent to the Emergency Department and the appointment cancelled. ____________________________________________________________________________________________   ____________________________________________________________________________________________  Preparing for Procedure with Sedation  Instructions: . Oral Intake: Do not eat or drink anything for at least 8 hours prior to your procedure. . Transportation: Public transportation is not allowed. Bring an adult driver. The driver must be physically present in our waiting room before any procedure can be started. Marland Kitchen Physical Assistance: Bring an adult physically capable of assisting you, in the event you need help. This adult should keep you company at home for at least 6 hours after the procedure. . Blood Pressure Medicine: Take your blood pressure medicine with a sip of water the morning of the procedure. . Blood thinners:  . Diabetics on insulin: Notify the staff so that you can be scheduled 1st case in the morning. If your diabetes requires high dose insulin, take only  of your normal insulin dose the  morning of the procedure and notify the staff that you have done so. . Preventing infections: Shower with an antibacterial soap the morning of your procedure. . Build-up your immune system: Take 1000 mg of Vitamin C with every meal (3  times a day) the day prior to your procedure. Marland Kitchen Antibiotics: Inform the staff if you have a condition or reason that requires you to take antibiotics before dental procedures. . Pregnancy: If you are pregnant, call and cancel the procedure. . Sickness: If you have a cold, fever, or any active infections, call and cancel the procedure. . Arrival: You must be in the facility at least 30 minutes prior to your scheduled procedure. . Children: Do not bring children with you. . Dress appropriately: Bring dark clothing that you would not mind if they get stained. . Valuables: Do not bring any jewelry or valuables.  Procedure appointments are reserved for interventional treatments only. Marland Kitchen No Prescription Refills. . No medication changes will be discussed during procedure appointments. . No disability issues will be discussed.  Remember:  Regular Business hours are:  Monday to Thursday 8:00 AM to 4:00 PM  Provider's Schedule: Delano Metz, MD:  Procedure days: Tuesday and Thursday 7:30 AM to 4:00 PM  Edward Jolly, MD:  Procedure days: Monday and Wednesday 7:30 AM to 4:00 PM ____________________________________________________________________________________________   ____________________________________________________________________________________________  Medication Rules  Applies to: All patients receiving prescriptions (written or electronic).  Pharmacy of record: Pharmacy where electronic prescriptions will be sent. If written prescriptions are taken to a different pharmacy, please inform the nursing staff. The pharmacy listed in the electronic medical record should be the one where you would like electronic prescriptions to be sent.  Prescription refills: Only during scheduled appointments. Applies to both, written and electronic prescriptions.  NOTE: The following applies primarily to controlled substances (Opioid* Pain Medications).   Patient's responsibilities: 1. Pain  Pills: Bring all pain pills to every appointment (except for procedure appointments). 2. Pill Bottles: Bring pills in original pharmacy bottle. Always bring newest bottle. Bring bottle, even if empty. 3. Medication refills: You are responsible for knowing and keeping track of what medications you need refilled. The day before your appointment, write a list of all prescriptions that need to be refilled. Bring that list to your appointment and give it to the admitting nurse. Prescriptions will be written only during appointments. If you forget a medication, it will not be "Called in", "Faxed", or "electronically sent". You will need to get another appointment to get these prescribed. 4. Prescription Accuracy: You are responsible for carefully inspecting your prescriptions before leaving our office. Have the discharge nurse carefully go over each prescription with you, before taking them home. Make sure that your name is accurately spelled, that your address is correct. Check the name and dose of your medication to make sure it is accurate. Check the number of pills, and the written instructions to make sure they are clear and accurate. Make sure that you are given enough medication to last until your next medication refill appointment. 5. Taking Medication: Take medication as prescribed. Never take more pills than instructed. Never take medication more frequently than prescribed. Taking less pills or less frequently is permitted and encouraged, when it comes to controlled substances (written prescriptions).  6. Inform other Doctors: Always inform, all of your healthcare providers, of all the medications you take. 7. Pain Medication from other Providers: You are not allowed to accept any additional pain medication from any other Doctor or Healthcare  provider. There are two exceptions to this rule. (see below) In the event that you require additional pain medication, you are responsible for notifying us, as stated  below. 8. Medication Agreement: You are responsible for carefully reading and following our Medication Agreement. This must be signed before receiving any prescriptions from our practice. Safely store a copy of your signed Agreement. Violations to the Agreement will result in no further prescriptions. (Additional copies of our Medication Agreement are available upon request.) 9. Laws, Rules, & Regulations: All patients are expected to follow all [address] and [employer], Rules,  [place]. Ignorance of the Laws does not constitute a valid excuse. The use of any illegal substances is prohibited. 10. Adopted CDC guidelines & recommendations: Target dosing levels will be at or below 60 MME/day. Use of benzodiazepines** is not recommended.  Exceptions: There are only two exceptions to the rule of not receiving pain medications from other Healthcare Providers. 1. Exception #1 (Emergencies): In the event of an emergency (i.e.: accident requiring emergency care), you are allowed to receive additional pain medication. However, you are responsible for: As soon as you are able, call our office [phone], at any time of the day or night, and leave a message stating your name, the date and nature of the emergency, and the name and dose of the medication prescribed. In the event that your call is answered by a member of our staff, make sure to document and save the date, time, and the name of the person that took your information.  2. Exception #2 (Planned Surgery): In the event that you are scheduled by another doctor or dentist to have any type of surgery or procedure, you are allowed (for a period no longer than 30 days), to receive additional pain medication, for the acute post-op pain. However, in this case, you are responsible for picking up a copy of our "Post-op Pain Management for Surgeons" handout, and giving it to your surgeon or dentist. This document is available at our office, and does not  require an appointment to obtain it. Simply go to our office during business hours (Monday-Thursday from 8:00 AM to 4:00 PM) (Friday 8:00 AM to 12:00 Noon) or if you have a scheduled appointment with [place], prior to your surgery, and ask for it by name. In addition, you will need to provide [place] with your name, name of your surgeon, type of surgery, and date of procedure or surgery.  *Opioid medications include: morphine, codeine, oxycodone, oxymorphone, hydrocodone, hydromorphone, meperidine, tramadol, tapentadol, buprenorphine, fentanyl, methadone. **Benzodiazepine medications include: diazepam (Valium), alprazolam (Xanax), clonazepam (Klonopine), lorazepam (Ativan), clorazepate (Tranxene), chlordiazepoxide (Librium), estazolam (Prosom), oxazepam (Serax), temazepam (Restoril), triazolam (Halcion) (Last updated: 07/18/2017) ____________________________________________________________________________________________  ____________________________________________________________________________________________  Medication Recommendations and Reminders  Applies to: All patients receiving prescriptions (written and/or electronic).  Medication Rules & Regulations: These rules and regulations exist for your safety and that of others. They are not flexible and neither are we. Dismissing or ignoring them will be considered "non-compliance" with medication therapy, resulting in complete and irreversible termination of such therapy. (See document titled "Medication Rules" for more details.) In all conscience, because of safety reasons, we cannot continue providing a therapy where the patient does not follow instructions.  Pharmacy of record:   Definition: This is the pharmacy where your electronic prescriptions will be sent.   We do not endorse any particular pharmacy.  You are not restricted in your choice of pharmacy.  The pharmacy listed in the electronic medical record should  be the one where you want  electronic prescriptions to be sent.  If you choose to change pharmacy, simply notify our nursing staff of your choice of new pharmacy.  Recommendations:  Keep all of your pain medications in a safe place, under lock and key, even if you live alone.   After you fill your prescription, take 1 week's worth of pills and put them away in a safe place. You should keep a separate, properly labeled bottle for this purpose. The remainder should be kept in the original bottle. Use this as your primary supply, until it runs out. Once it's gone, then you know that you have 1 week's worth of medicine, and it is time to come in for a prescription refill. If you do this correctly, it is unlikely that you will ever run out of medicine.  To make sure that the above recommendation works, it is very important that you make sure your medication refill appointments are scheduled at least 1 week before you run out of medicine. To do this in an effective manner, make sure that you do not leave the office without scheduling your next medication management appointment. Always ask the nursing staff to show you in your prescription , when your medication will be running out. Then arrange for the receptionist to get you a return appointment, at least 7 days before you run out of medicine. Do not wait until you have 1 or 2 pills left, to come in. This is very poor planning and does not take into consideration that we may need to cancel appointments due to bad weather, sickness, or emergencies affecting our staff.  Prescription refills and/or changes in medication(s):   Prescription refills, and/or changes in dose or medication, will be conducted only during scheduled medication management appointments. (Applies to both, written and electronic prescriptions.)  No refills on procedure days. No medication will be changed or started on procedure days. No changes, adjustments, and/or refills will be conducted on a procedure day.  Doing so will interfere with the diagnostic portion of the procedure.  No phone refills. No medications will be "called into the pharmacy".  No Fax refills.  No weekend refills.  No Holliday refills.  No after hours refills.  Remember:  Business hours are:  Monday to Thursday 8:00 AM to 4:00 PM Provider's Schedule: Thad Ranger, NP - Appointments are:  Medication management: Monday to Thursday 8:00 AM to 4:00 PM Delano Metz, MD - Appointments are:  Medication management: Monday and Wednesday 8:00 AM to 4:00 PM Procedure day: Tuesday and Thursday 7:30 AM to 4:00 PM Edward Jolly, MD - Appointments are:  Medication management: Tuesday and Thursday 8:00 AM to 4:00 PM Procedure day: Monday and Wednesday 7:30 AM to 4:00 PM (Last update: 07/18/2017) ____________________________________________________________________________________________  ____________________________________________________________________________________________  CANNABIDIOL (AKA: CBD Oil or Pills)  Applies to: All patients receiving prescriptions of controlled substances (written and/or electronic).  General Information: Cannabidiol (CBD) was discovered in [birth year]. It is one of some 113 identified cannabinoids in cannabis (Marijuana) plants, accounting for up to 40% of the plant's extract. As of 2018, preliminary clinical research on cannabidiol included studies of anxiety, cognition, movement disorders, and pain.  Cannabidiol is consummed in multiple ways, including inhalation of cannabis smoke or vapor, as an aerosol spray into the cheek, and by mouth. It may be supplied as CBD oil containing CBD as the active ingredient (no added tetrahydrocannabinol (THC) or terpenes), a full-plant CBD-dominant hemp extract oil, capsules, dried cannabis, or as a  liquid solution. CBD is thought not have the same psychoactivity as THC, and may affect the actions of THC. Studies suggest that CBD may interact with different  biological targets, including cannabinoid receptors and other neurotransmitter receptors. As of 2018 the mechanism of action for its biological effects has not been determined.  In the [place], cannabidiol has a limited approval by the Food and Drug Administration (FDA) for treatment of only two types of epilepsy disorders. The side effects of long-term use of the drug include somnolence, decreased appetite, diarrhea, fatigue, malaise, weakness, sleeping problems, and others.  CBD remains a Schedule I drug prohibited for any use.  Legality: Some manufacturers ship CBD products nationally, an illegal action which the FDA has not enforced in 2018, with CBD remaining the subject of an FDA investigational new drug evaluation, and is not considered legal as a dietary supplement or food ingredient as of December 2018. Federal illegality has made it difficult historically to conduct research on CBD. CBD is openly sold in head shops and health food stores in some states where such sales have not been explicitly legalized.  Warning: Because it is not FDA approved for general use or treatment of pain, it is not required to undergo the same manufacturing controls as prescription drugs.  This means that the available cannabidiol (CBD) may be contaminated with THC.  If this is the case, it will trigger a positive urine drug screen (UDS) test for cannabinoids (Marijuana).  Because a positive UDS for illicit substances is a violation of our medication agreement, your opioid analgesics (pain medicine) may be permanently discontinued. (Last update: 08/08/2017) ____________________________________________________________________________________________

## 2017-09-02 NOTE — Progress Notes (Signed)
Patient's Name: Carrie Gardner  MRN: [id number]  Referring Provider: Titus Mould*  DOB: May 08, 1951  PCP: Titus Mould, NP  DOS: 09/03/2017  Note by: Oswaldo Done, MD  Service setting: Ambulatory outpatient  Specialty: Interventional Pain Management  Patient type: Established  Location: [facility] (AMB) Pain Management Facility  Visit type: Interventional Procedure   Primary Reason for Visit: Interventional Pain Management Treatment. CC: Back Pain (right, lower)  Procedure:       Anesthesia, Analgesia, Anxiolysis:  Type: Lumbar Facet, Medial Branch Block(s)          Primary Purpose: Diagnostic Region: Posterolateral Lumbosacral Spine Level: L2, L3, L4, L5, & S1 Medial Branch Level(s). Injecting these levels blocks the L3-4, L4-5, and L5-S1 lumbar facet joints. Laterality: Bilateral  Type: Moderate (Conscious) Sedation combined with Local Anesthesia Indication(s): Analgesia and Anxiety Route: Intravenous (IV) IV Access: Secured Sedation: Meaningful verbal contact was maintained at all times during the procedure  Local Anesthetic: Lidocaine 1-2%   Indications: 1. Spondylosis without myelopathy or radiculopathy, lumbosacral region   2. Lumbar Facet Syndrome (Bilateral)   3. 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 (year old), female patient, seen today for interventional treatment. She  has a past surgical history that includes Spine surgery; Cholecystectomy; Abdominal hysterectomy; Pacemaker insertion; Laparoscopic gastric banding; Laparoscopic gastric band removal with laparoscopic gastric sleeve resection; LEFT HEART CATH AND CORONARY ANGIOGRAPHY (Left, 11/08/2016); and Colonoscopy. Carrie Gardner has a current medication list which includes the following prescription(s): albuterol, aspirin,  aspirin-salicylamide-caffeine, atorvastatin, gnp calcium 1200, vitamin d3, diclofenac sodium, empagliflozin, ergocalciferol, furosemide, hydrocodone-acetaminophen, insulin aspart, insulin detemir, levothyroxine, lisinopril, magnesium, omeprazole, ondansetron, ropinirole, and victoza, and the following Facility-Administered Medications: fentanyl and midazolam. Her primarily concern today is the Back Pain (right, lower)  Initial Vital Signs:  Pulse Rate: 89 Temp: 97.6 F (36.4 C) Resp: 18 BP: (!) 143/74 SpO2: 100 %  BMI: Estimated body mass index is 40.24 kg/m as calculated from the following:   Height as of this encounter: 5\' 2"  (1.575 m).   Weight as of this encounter: 220 lb (99.8 kg).  Risk Assessment: Allergies: Reviewed. She is allergic to metformin; metformin and related; pregabalin; ibuprofen; iodinated diagnostic agents; lisinopril; mobic [meloxicam]; oxycodone; oxycodone-acetaminophen; and tramadol.  Allergy Precautions: None required Coagulopathies: Reviewed. None identified.  Blood-thinner therapy: None at this time Active Infection(s): Reviewed. None identified. Carrie Gardner is afebrile  Site Confirmation: Carrie Gardner was asked to confirm the procedure and laterality before marking the site Procedure checklist: Completed Consent: Before the procedure and under the influence of no sedative(s), amnesic(s), or anxiolytics, the patient was informed of the treatment options, risks and possible complications. To fulfill our ethical and legal obligations, as recommended by the American Medical Association's Code of Ethics, I have informed the patient of my clinical impression; the nature and purpose of the treatment or procedure; the risks, benefits, and possible complications of the intervention; the alternatives, including doing nothing; the risk(s) and benefit(s) of the alternative treatment(s) or procedure(s); and the risk(s) and benefit(s) of doing nothing. The patient was provided  information about the general risks and possible complications associated with the procedure. These may include, but are not limited to: failure to achieve desired goals, infection, bleeding, organ or nerve damage, allergic reactions, paralysis, and death. In addition, the patient was informed of those risks and complications  associated to Spine-related procedures, such as failure to decrease pain; infection (i.e.: Meningitis, epidural or intraspinal abscess); bleeding (i.e.: epidural hematoma, subarachnoid hemorrhage, or any other type of intraspinal or peri-dural bleeding); organ or nerve damage (i.e.: Any type of peripheral nerve, nerve root, or spinal cord injury) with subsequent damage to sensory, motor, and/or autonomic systems, resulting in permanent pain, numbness, and/or weakness of one or several areas of the body; allergic reactions; (i.e.: anaphylactic reaction); and/or death. Furthermore, the patient was informed of those risks and complications associated with the medications. These include, but are not limited to: allergic reactions (i.e.: anaphylactic or anaphylactoid reaction(s)); adrenal axis suppression; blood sugar elevation that in diabetics may result in ketoacidosis or comma; water retention that in patients with history of congestive heart failure may result in shortness of breath, pulmonary edema, and decompensation with resultant heart failure; weight gain; swelling or edema; medication-induced neural toxicity; particulate matter embolism and blood vessel occlusion with resultant organ, and/or nervous system infarction; and/or aseptic necrosis of one or more joints. Finally, the patient was informed that Medicine is not an exact science; therefore, there is also the possibility of unforeseen or unpredictable risks and/or possible complications that may result in a catastrophic outcome. The patient indicated having understood very clearly. We have given the patient no guarantees and we  have made no promises. Enough time was given to the patient to ask questions, all of which were answered to the patient's satisfaction. Carrie Gardner has indicated that she wanted to continue with the procedure. Attestation: I, the ordering provider, attest that I have discussed with the patient the benefits, risks, side-effects, alternatives, likelihood of achieving goals, and potential problems during recovery for the procedure that I have provided informed consent. Date  Time: 09/03/2017  9:40 AM  Pre-Procedure Preparation:  Monitoring: As per clinic protocol. Respiration, ETCO2, SpO2, BP, heart rate and rhythm monitor placed and checked for adequate function Safety Precautions: Patient was assessed for positional comfort and pressure points before starting the procedure. Time-out: I initiated and conducted the "Time-out" before starting the procedure, as per protocol. The patient was asked to participate by confirming the accuracy of the "Time Out" information. Verification of the correct person, site, and procedure were performed and confirmed by me, the nursing staff, and the patient. "Time-out" conducted as per Joint Commission's Universal Protocol (UP.01.01.01). Time: 1040  Description of Procedure:       Position: Prone Laterality: Bilateral. The procedure was performed in identical fashion on both sides. Levels:  L2, L3, L4, L5, & S1 Medial Branch Level(s) Area Prepped: Posterior Lumbosacral Region Prepping solution: ChloraPrep (2% chlorhexidine gluconate and 70% isopropyl alcohol) Safety Precautions: Aspiration looking for blood return was conducted prior to all injections. At no point did we inject any substances, as a needle was being advanced. Before injecting, the patient was told to immediately notify me if she was experiencing any new onset of "ringing in the ears, or metallic taste in the mouth". No attempts were made at seeking any paresthesias. Safe injection practices and needle  disposal techniques used. Medications properly checked for expiration dates. SDV (single dose vial) medications used. After the completion of the procedure, all disposable equipment used was discarded in the proper designated medical waste containers. Local Anesthesia: Protocol guidelines were followed. The patient was positioned over the fluoroscopy table. The area was prepped in the usual manner. The time-out was completed. The target area was identified using fluoroscopy. A 12-in long, straight, sterile hemostat was used with fluoroscopic  guidance to locate the targets for each level blocked. Once located, the skin was marked with an approved surgical skin marker. Once all sites were marked, the skin (epidermis, dermis, and hypodermis), as well as deeper tissues (fat, connective tissue and muscle) were infiltrated with a small amount of a short-acting local anesthetic, loaded on a 10cc syringe with a 25G, 1.5-in  Needle. An appropriate amount of time was allowed for local anesthetics to take effect before proceeding to the next step. Local Anesthetic: Lidocaine 2.0% The unused portion of the local anesthetic was discarded in the proper designated containers. Technical explanation of process:  L2 Medial Branch Nerve Block (MBB): The target area for the L2 medial branch is at the junction of the postero-lateral aspect of the superior articular process and the superior, posterior, and medial edge of the transverse process of L3. Under fluoroscopic guidance, a Quincke needle was inserted until contact was made with os over the superior postero-lateral aspect of the pedicular shadow (target area). After negative aspiration for blood, 0.5 mL of the nerve block solution was injected without difficulty or complication. The needle was removed intact. L3 Medial Branch Nerve Block (MBB): The target area for the L3 medial branch is at the junction of the postero-lateral aspect of the superior articular process and the  superior, posterior, and medial edge of the transverse process of L4. Under fluoroscopic guidance, a Quincke needle was inserted until contact was made with os over the superior postero-lateral aspect of the pedicular shadow (target area). After negative aspiration for blood, 0.5 mL of the nerve block solution was injected without difficulty or complication. The needle was removed intact. L4 Medial Branch Nerve Block (MBB): The target area for the L4 medial branch is at the junction of the postero-lateral aspect of the superior articular process and the superior, posterior, and medial edge of the transverse process of L5. Under fluoroscopic guidance, a Quincke needle was inserted until contact was made with os over the superior postero-lateral aspect of the pedicular shadow (target area). After negative aspiration for blood, 0.5 mL of the nerve block solution was injected without difficulty or complication. The needle was removed intact. L5 Medial Branch Nerve Block (MBB): The target area for the L5 medial branch is at the junction of the postero-lateral aspect of the superior articular process and the superior, posterior, and medial edge of the sacral ala. Under fluoroscopic guidance, a Quincke needle was inserted until contact was made with os over the superior postero-lateral aspect of the pedicular shadow (target area). After negative aspiration for blood, 0.5 mL of the nerve block solution was injected without difficulty or complication. The needle was removed intact. S1 Medial Branch Nerve Block (MBB): The target area for the S1 medial branch is at the posterior and inferior 6 o'clock position of the L5-S1 facet joint. Under fluoroscopic guidance, the Quincke needle inserted for the L5 MBB was redirected until contact was made with os over the inferior and postero aspect of the sacrum, at the 6 o' clock position under the L5-S1 facet joint (Target area). After negative aspiration for blood, 0.5 mL of the  nerve block solution was injected without difficulty or complication. The needle was removed intact. Procedural Needles: 22-gauge, 3.5-inch, Quincke needles used for all levels. Nerve block solution: 0.2% PF-Ropivacaine + Triamcinolone (40 mg/mL) diluted to a final concentration of 4 mg of Triamcinolone/mL of Ropivacaine The unused portion of the solution was discarded in the proper designated containers.  Once the entire procedure was  completed, the treated area was cleaned, making sure to leave some of the prepping solution back to take advantage of its long term bactericidal properties.   Illustration of the posterior view of the lumbar spine and the posterior neural structures. Laminae of L2 through S1 are labeled. DPRL5, dorsal primary ramus of L5; DPRS1, dorsal primary ramus of S1; DPR3, dorsal primary ramus of L3; FJ, facet (zygapophyseal) joint L3-L4; I, inferior articular process of L4; LB1, lateral branch of dorsal primary ramus of L1; IAB, inferior articular branches from L3 medial branch (supplies L4-L5 facet joint); IBP, intermediate branch plexus; MB3, medial branch of dorsal primary ramus of L3; NR3, third lumbar nerve root; S, superior articular process of L5; SAB, superior articular branches from L4 (supplies L4-5 facet joint also); TP3, transverse process of L3.  Vitals:   09/03/17 1053 09/03/17 1103 09/03/17 1113 09/03/17 1123  BP: 132/75 (!) 148/91 133/74 (!) 149/72  Pulse:      Resp: 20 18 18 18   Temp:      TempSrc:      SpO2: 99% 100% 98% 100%  Weight:      Height:        Start Time: 1040 hrs. End Time: 1052 hrs.  Imaging Guidance (Spinal):  Type of Imaging Technique: Fluoroscopy Guidance (Spinal) Indication(s): Assistance in needle guidance and placement for procedures requiring needle placement in or near specific anatomical locations not easily accessible without such assistance. Exposure Time: Please see nurses notes. Contrast: None used. Fluoroscopic Guidance: I  was personally present during the use of fluoroscopy. "Tunnel Vision Technique" used to obtain the best possible view of the target area. Parallax error corrected before commencing the procedure. "Direction-depth-direction" technique used to introduce the needle under continuous pulsed fluoroscopy. Once target was reached, antero-posterior, oblique, and lateral fluoroscopic projection used confirm needle placement in all planes. Images permanently stored in EMR. Interpretation: No contrast injected. I personally interpreted the imaging intraoperatively. Adequate needle placement confirmed in multiple planes. Permanent images saved into the patient's record.  Antibiotic Prophylaxis:   Anti-infectives (From admission, onward)   None     Indication(s): None identified  Post-operative Assessment:  Post-procedure Vital Signs:  Pulse Rate: 89 Temp: 97.6 F (36.4 C) Resp: 18 BP: (!) 149/72 SpO2: 100 %  EBL: None  Complications: No immediate post-treatment complications observed by team, or reported by patient.  Note: The patient tolerated the entire procedure well. A repeat set of vitals were taken after the procedure and the patient was kept under observation following institutional policy, for this type of procedure. Post-procedural neurological assessment was performed, showing return to baseline, prior to discharge. The patient was provided with post-procedure discharge instructions, including a section on how to identify potential problems. Should any problems arise concerning this procedure, the patient was given instructions to immediately contact , at any time, without hesitation. In any case, we plan to contact the patient by telephone for a follow-up status report regarding this interventional procedure.  Comments:  No additional relevant information.  Plan of Care    Imaging Orders     DG C-Arm 1-60 Min-No Report  Procedure Orders     LUMBAR FACET(MEDIAL BRANCH NERVE BLOCK)  MBNB  Medications ordered for procedure: Meds ordered this encounter  Medications  . lidocaine (XYLOCAINE) 2 % (with pres) injection 400 mg  . midazolam (VERSED) 5 MG/5ML injection 1-2 mg    Make sure Flumazenil is available in the pyxis when using this medication. If oversedation occurs, administer 0.2 mg  IV over 15 sec. If after 45 sec no response, administer 0.2 mg again over 1 min; may repeat at 1 min intervals; not to exceed 4 doses (1 mg)  . fentaNYL (SUBLIMAZE) injection 25-50 mcg    Make sure Narcan is available in the pyxis when using this medication. In the event of respiratory depression (RR< 8/min): Titrate NARCAN (naloxone) in increments of 0.1 to 0.2 mg IV at 2-3 minute intervals, until desired degree of reversal.  . lactated ringers infusion 1,000 mL  . ropivacaine (PF) 2 mg/mL (0.2%) (NAROPIN) injection 18 mL  . triamcinolone acetonide (KENALOG-40) injection 80 mg   Medications administered: We administered lidocaine, midazolam, fentaNYL, lactated ringers, ropivacaine (PF) 2 mg/mL (0.2%), and triamcinolone acetonide.  See the medical record for exact dosing, route, and time of administration.  New Prescriptions   No medications on file   Disposition: Discharge home  Discharge Date & Time: 09/03/2017; 1135 hrs.   Physician-requested Follow-up: Return for post-procedure eval (2 wks), w/ Dr. Laban Emperor.  Future Appointments  Date Time Provider Department Center  09/30/2017 10:15 AM Delano Metz, MD [facility] None   Primary Care Physician: Titus Mould, NP Location: [facility] Note by: Oswaldo Done, MD Date: 09/03/2017; Time: 12:07 PM  Disclaimer:  Medicine is not an Visual merchandiser. The only guarantee in medicine is that nothing is guaranteed. It is important to note that the decision to proceed with this intervention was based on the information collected from the patient. The Data and conclusions were drawn from the  patient's questionnaire, the interview, and the physical examination. Because the information was provided in large part by the patient, it cannot be guaranteed that it has not been purposely or unconsciously manipulated. Every effort has been made to obtain as much relevant data as possible for this evaluation. It is important to note that the conclusions that lead to this procedure are derived in large part from the available data. Always take into account that the treatment will also be dependent on availability of resources and existing treatment guidelines, considered by other Pain Management Practitioners as being common knowledge and practice, at the time of the intervention. For Medico-Legal purposes, it is also important to point out that variation in procedural techniques and pharmacological choices are the acceptable norm. The indications, contraindications, technique, and results of the above procedure should only be interpreted and judged by a Board-Certified Interventional Pain Specialist with extensive familiarity and expertise in the same exact procedure and technique.

## 2017-09-03 ENCOUNTER — Ambulatory Visit (HOSPITAL_BASED_OUTPATIENT_CLINIC_OR_DEPARTMENT_OTHER): Payer: Medicare Other | Admitting: Pain Medicine

## 2017-09-03 ENCOUNTER — Other Ambulatory Visit: Payer: Self-pay

## 2017-09-03 ENCOUNTER — Ambulatory Visit
Admission: RE | Admit: 2017-09-03 | Discharge: 2017-09-03 | Disposition: A | Discharge status: Home / Self Care | Payer: Medicare Other | Source: Ambulatory Visit | Attending: Pain Medicine | Admitting: Pain Medicine

## 2017-09-03 ENCOUNTER — Encounter: Payer: Self-pay | Admitting: Pain Medicine

## 2017-09-03 VITALS — BP 149/72 | HR 89 | Temp 97.6°F | Resp 18 | Ht 62.0 in | Wt 220.0 lb

## 2017-09-03 DIAGNOSIS — M5441 Lumbago with sciatica, right side: Secondary | ICD-10-CM | POA: Diagnosis not present

## 2017-09-03 DIAGNOSIS — M47817 Spondylosis without myelopathy or radiculopathy, lumbosacral region: Secondary | ICD-10-CM

## 2017-09-03 DIAGNOSIS — M5442 Lumbago with sciatica, left side: Secondary | ICD-10-CM

## 2017-09-03 DIAGNOSIS — M47816 Spondylosis without myelopathy or radiculopathy, lumbar region: Secondary | ICD-10-CM

## 2017-09-03 DIAGNOSIS — G8929 Other chronic pain: Secondary | ICD-10-CM | POA: Insufficient documentation

## 2017-09-03 MED ORDER — MIDAZOLAM HCL 5 MG/5ML IJ SOLN
1.0000 mg | INTRAMUSCULAR | Status: DC | PRN
  Administered 2017-09-03: 3 mg via INTRAVENOUS
  Filled 2017-09-03: qty 5

## 2017-09-03 MED ORDER — TRIAMCINOLONE ACETONIDE 40 MG/ML IJ SUSP
80.0000 mg | Freq: Once | INTRAMUSCULAR | Status: AC
  Administered 2017-09-03: 40 mg
  Filled 2017-09-03: qty 2

## 2017-09-03 MED ORDER — LACTATED RINGERS IV SOLN
1000.0000 mL | Freq: Once | INTRAVENOUS | Status: AC
  Administered 2017-09-03: 1000 mL via INTRAVENOUS

## 2017-09-03 MED ORDER — ROPIVACAINE HCL 2 MG/ML IJ SOLN
18.0000 mL | Freq: Once | INTRAMUSCULAR | Status: AC
  Administered 2017-09-03: 10 mL via PERINEURAL
  Filled 2017-09-03: qty 20

## 2017-09-03 MED ORDER — FENTANYL CITRATE (PF) 100 MCG/2ML IJ SOLN
25.0000 ug | INTRAMUSCULAR | Status: DC | PRN
  Administered 2017-09-03: 100 ug via INTRAVENOUS
  Filled 2017-09-03: qty 2

## 2017-09-03 MED ORDER — LIDOCAINE HCL 2 % IJ SOLN
20.0000 mL | Freq: Once | INTRAMUSCULAR | Status: AC
  Administered 2017-09-03: 400 mg
  Filled 2017-09-03: qty 40

## 2017-09-03 NOTE — Progress Notes (Signed)
Safety precautions to be maintained throughout the outpatient stay will include: orient to surroundings, keep bed in low position, maintain call bell within reach at all times, provide assistance with transfer out of bed and ambulation.  

## 2017-09-03 NOTE — Patient Instructions (Addendum)

## 2017-09-04 ENCOUNTER — Telehealth: Payer: Self-pay | Admitting: *Deleted

## 2017-09-04 NOTE — Telephone Encounter (Signed)
Attempted to call for post procedure follow-up. Message left. 

## 2017-09-17 ENCOUNTER — Other Ambulatory Visit: Payer: Self-pay | Admitting: Pain Medicine

## 2017-09-17 DIAGNOSIS — E559 Vitamin D deficiency, unspecified: Secondary | ICD-10-CM

## 2017-09-30 ENCOUNTER — Ambulatory Visit: Payer: Medicare Other | Attending: Pain Medicine | Admitting: Pain Medicine

## 2017-09-30 ENCOUNTER — Encounter: Payer: Self-pay | Admitting: Pain Medicine

## 2017-09-30 ENCOUNTER — Other Ambulatory Visit: Payer: Self-pay

## 2017-09-30 VITALS — BP 162/88 | HR 72 | Temp 97.7°F | Resp 18 | Ht 62.5 in | Wt 228.0 lb

## 2017-09-30 DIAGNOSIS — I129 Hypertensive chronic kidney disease with stage 1 through stage 4 chronic kidney disease, or unspecified chronic kidney disease: Secondary | ICD-10-CM | POA: Diagnosis not present

## 2017-09-30 DIAGNOSIS — M5137 Other intervertebral disc degeneration, lumbosacral region: Secondary | ICD-10-CM | POA: Diagnosis not present

## 2017-09-30 DIAGNOSIS — M79602 Pain in left arm: Secondary | ICD-10-CM | POA: Diagnosis not present

## 2017-09-30 DIAGNOSIS — E039 Hypothyroidism, unspecified: Secondary | ICD-10-CM | POA: Insufficient documentation

## 2017-09-30 DIAGNOSIS — M797 Fibromyalgia: Secondary | ICD-10-CM | POA: Diagnosis not present

## 2017-09-30 DIAGNOSIS — M5116 Intervertebral disc disorders with radiculopathy, lumbar region: Secondary | ICD-10-CM | POA: Diagnosis not present

## 2017-09-30 DIAGNOSIS — Z888 Allergy status to other drugs, medicaments and biological substances status: Secondary | ICD-10-CM | POA: Insufficient documentation

## 2017-09-30 DIAGNOSIS — M79601 Pain in right arm: Secondary | ICD-10-CM | POA: Diagnosis not present

## 2017-09-30 DIAGNOSIS — Z7982 Long term (current) use of aspirin: Secondary | ICD-10-CM | POA: Insufficient documentation

## 2017-09-30 DIAGNOSIS — E559 Vitamin D deficiency, unspecified: Secondary | ICD-10-CM | POA: Insufficient documentation

## 2017-09-30 DIAGNOSIS — M47816 Spondylosis without myelopathy or radiculopathy, lumbar region: Secondary | ICD-10-CM

## 2017-09-30 DIAGNOSIS — F411 Generalized anxiety disorder: Secondary | ICD-10-CM | POA: Insufficient documentation

## 2017-09-30 DIAGNOSIS — G4730 Sleep apnea, unspecified: Secondary | ICD-10-CM | POA: Insufficient documentation

## 2017-09-30 DIAGNOSIS — Z79890 Hormone replacement therapy (postmenopausal): Secondary | ICD-10-CM | POA: Insufficient documentation

## 2017-09-30 DIAGNOSIS — M5442 Lumbago with sciatica, left side: Secondary | ICD-10-CM | POA: Diagnosis not present

## 2017-09-30 DIAGNOSIS — M79604 Pain in right leg: Secondary | ICD-10-CM | POA: Insufficient documentation

## 2017-09-30 DIAGNOSIS — Z9049 Acquired absence of other specified parts of digestive tract: Secondary | ICD-10-CM | POA: Insufficient documentation

## 2017-09-30 DIAGNOSIS — G2581 Restless legs syndrome: Secondary | ICD-10-CM | POA: Insufficient documentation

## 2017-09-30 DIAGNOSIS — Z5181 Encounter for therapeutic drug level monitoring: Secondary | ICD-10-CM | POA: Insufficient documentation

## 2017-09-30 DIAGNOSIS — Z885 Allergy status to narcotic agent status: Secondary | ICD-10-CM | POA: Insufficient documentation

## 2017-09-30 DIAGNOSIS — M542 Cervicalgia: Secondary | ICD-10-CM | POA: Diagnosis not present

## 2017-09-30 DIAGNOSIS — M47897 Other spondylosis, lumbosacral region: Secondary | ICD-10-CM | POA: Diagnosis not present

## 2017-09-30 DIAGNOSIS — E1136 Type 2 diabetes mellitus with diabetic cataract: Secondary | ICD-10-CM | POA: Diagnosis not present

## 2017-09-30 DIAGNOSIS — E1142 Type 2 diabetes mellitus with diabetic polyneuropathy: Secondary | ICD-10-CM | POA: Insufficient documentation

## 2017-09-30 DIAGNOSIS — I2510 Atherosclerotic heart disease of native coronary artery without angina pectoris: Secondary | ICD-10-CM | POA: Insufficient documentation

## 2017-09-30 DIAGNOSIS — G894 Chronic pain syndrome: Secondary | ICD-10-CM

## 2017-09-30 DIAGNOSIS — J45909 Unspecified asthma, uncomplicated: Secondary | ICD-10-CM | POA: Diagnosis not present

## 2017-09-30 DIAGNOSIS — M5441 Lumbago with sciatica, right side: Secondary | ICD-10-CM

## 2017-09-30 DIAGNOSIS — K210 Gastro-esophageal reflux disease with esophagitis: Secondary | ICD-10-CM | POA: Diagnosis not present

## 2017-09-30 DIAGNOSIS — Z79891 Long term (current) use of opiate analgesic: Secondary | ICD-10-CM | POA: Insufficient documentation

## 2017-09-30 DIAGNOSIS — M79605 Pain in left leg: Secondary | ICD-10-CM | POA: Insufficient documentation

## 2017-09-30 DIAGNOSIS — Z6841 Body Mass Index (BMI) 40.0 and over, adult: Secondary | ICD-10-CM | POA: Insufficient documentation

## 2017-09-30 DIAGNOSIS — J449 Chronic obstructive pulmonary disease, unspecified: Secondary | ICD-10-CM | POA: Insufficient documentation

## 2017-09-30 DIAGNOSIS — M533 Sacrococcygeal disorders, not elsewhere classified: Secondary | ICD-10-CM | POA: Diagnosis not present

## 2017-09-30 DIAGNOSIS — Z886 Allergy status to analgesic agent status: Secondary | ICD-10-CM | POA: Insufficient documentation

## 2017-09-30 DIAGNOSIS — E785 Hyperlipidemia, unspecified: Secondary | ICD-10-CM | POA: Insufficient documentation

## 2017-09-30 DIAGNOSIS — N189 Chronic kidney disease, unspecified: Secondary | ICD-10-CM | POA: Diagnosis not present

## 2017-09-30 DIAGNOSIS — Z79899 Other long term (current) drug therapy: Secondary | ICD-10-CM | POA: Insufficient documentation

## 2017-09-30 DIAGNOSIS — E1122 Type 2 diabetes mellitus with diabetic chronic kidney disease: Secondary | ICD-10-CM | POA: Diagnosis not present

## 2017-09-30 DIAGNOSIS — Z794 Long term (current) use of insulin: Secondary | ICD-10-CM | POA: Insufficient documentation

## 2017-09-30 DIAGNOSIS — M961 Postlaminectomy syndrome, not elsewhere classified: Secondary | ICD-10-CM | POA: Diagnosis not present

## 2017-09-30 DIAGNOSIS — Z8673 Personal history of transient ischemic attack (TIA), and cerebral infarction without residual deficits: Secondary | ICD-10-CM | POA: Insufficient documentation

## 2017-09-30 DIAGNOSIS — G8929 Other chronic pain: Secondary | ICD-10-CM

## 2017-09-30 DIAGNOSIS — Z950 Presence of cardiac pacemaker: Secondary | ICD-10-CM | POA: Insufficient documentation

## 2017-09-30 MED ORDER — HYDROCODONE-ACETAMINOPHEN 5-325 MG PO TABS: 1.0000 | ORAL_TABLET | Freq: Two times a day (BID) | ORAL | 0 refills | Status: DC | PRN

## 2017-09-30 NOTE — Patient Instructions (Addendum)
____________________________________________________________________________________________  Preparing for Procedure with Sedation  Instructions: . Oral Intake: Do not eat or drink anything for at least 8 hours prior to your procedure. . Transportation: Public transportation is not allowed. Bring an adult driver. The driver must be physically present in our waiting room before any procedure can be started. . Physical Assistance: Bring an adult physically capable of assisting you, in the event you need help. This adult should keep you company at home for at least 6 hours after the procedure. . Blood Pressure Medicine: Take your blood pressure medicine with a sip of water the morning of the procedure. . Blood thinners:  . Diabetics on insulin: Notify the staff so that you can be scheduled 1st case in the morning. If your diabetes requires high dose insulin, take only  of your normal insulin dose the morning of the procedure and notify the staff that you have done so. . Preventing infections: Shower with an antibacterial soap the morning of your procedure. . Build-up your immune system: Take 1000 mg of Vitamin C with every meal (3 times a day) the day prior to your procedure. . Antibiotics: Inform the staff if you have a condition or reason that requires you to take antibiotics before dental procedures. . Pregnancy: If you are pregnant, call and cancel the procedure. . Sickness: If you have a cold, fever, or any active infections, call and cancel the procedure. . Arrival: You must be in the facility at least 30 minutes prior to your scheduled procedure. . Children: Do not bring children with you. . Dress appropriately: Bring dark clothing that you would not mind if they get stained. . Valuables: Do not bring any jewelry or valuables.  Procedure appointments are reserved for interventional treatments only. . No Prescription Refills. . No medication changes will be discussed during procedure  appointments. . No disability issues will be discussed.  Remember:  Regular Business hours are:  Monday to Thursday 8:00 AM to 4:00 PM  Provider's Schedule: Samentha Perham, MD:  Procedure days: Tuesday and Thursday 7:30 AM to 4:00 PM  Bilal Lateef, MD:  Procedure days: Monday and Wednesday 7:30 AM to 4:00 PM ____________________________________________________________________________________________   ____________________________________________________________________________________________  Pain Scale  Introduction: The pain score used by this practice is the Verbal Numerical Rating Scale (VNRS-11). This is an 11-point scale. It is for adults and children 10 years or older. There are significant differences in how the pain score is reported, used, and applied. Forget everything you learned in the past and learn this scoring system.  General Information: The scale should reflect your current level of pain. Unless you are specifically asked for the level of your worst pain, or your average pain. If you are asked for one of these two, then it should be understood that it is over the past 24 hours.  Basic Activities of Daily Living (ADL): Personal hygiene, dressing, eating, transferring, and using restroom.  Instructions: Most patients tend to report their level of pain as a combination of two factors, their physical pain and their psychosocial pain. This last one is also known as "suffering" and it is reflection of how physical pain affects you socially and psychologically. From now on, report them separately. From this point on, when asked to report your pain level, report only your physical pain. Use the following table for reference.  Pain Clinic Pain Levels (0-5/10)  Pain Level Score  Description  No Pain 0   Mild pain 1 Nagging, annoying, but does not interfere   with basic activities of daily living (ADL). Patients are able to eat, bathe, get dressed, toileting (being able to  get on and off the toilet and perform personal hygiene functions), transfer (move in and out of bed or a chair without assistance), and maintain continence (able to control bladder and bowel functions). Blood pressure and heart rate are unaffected. A normal heart rate for a healthy adult ranges from 60 to 100 bpm (beats per minute).   Mild to moderate pain 2 Noticeable and distracting. Impossible to hide from other people. More frequent flare-ups. Still possible to adapt and function close to normal. It can be very annoying and may have occasional stronger flare-ups. With discipline, patients may get used to it and adapt.   Moderate pain 3 Interferes significantly with activities of daily living (ADL). It becomes difficult to feed, bathe, get dressed, get on and off the toilet or to perform personal hygiene functions. Difficult to get in and out of bed or a chair without assistance. Very distracting. With effort, it can be ignored when deeply involved in activities.   Moderately severe pain 4 Impossible to ignore for more than a few minutes. With effort, patients may still be able to manage work or participate in some social activities. Very difficult to concentrate. Signs of autonomic nervous system discharge are evident: dilated pupils (mydriasis); mild sweating (diaphoresis); sleep interference. Heart rate becomes elevated (>115 bpm). Diastolic blood pressure (lower number) rises above 100 mmHg. Patients find relief in laying down and not moving.   Severe pain 5 Intense and extremely unpleasant. Associated with frowning face and frequent crying. Pain overwhelms the senses.  Ability to do any activity or maintain social relationships becomes significantly limited. Conversation becomes difficult. Pacing back and forth is common, as getting into a comfortable position is nearly impossible. Pain wakes you up from deep sleep. Physical signs will be obvious: pupillary dilation; increased sweating; goosebumps;  brisk reflexes; cold, clammy hands and feet; nausea, vomiting or dry heaves; loss of appetite; significant sleep disturbance with inability to fall asleep or to remain asleep. When persistent, significant weight loss is observed due to the complete loss of appetite and sleep deprivation.  Blood pressure and heart rate becomes significantly elevated. Caution: If elevated blood pressure triggers a pounding headache associated with blurred vision, then the patient should immediately seek attention at an urgent or emergency care unit, as these may be signs of an impending stroke.    Emergency Department Pain Levels (6-10/10)  Emergency Room Pain 6 Severely limiting. Requires emergency care and should not be seen or managed at an outpatient pain management facility. Communication becomes difficult and requires great effort. Assistance to reach the emergency department may be required. Facial flushing and profuse sweating along with potentially dangerous increases in heart rate and blood pressure will be evident.   Distressing pain 7 Self-care is very difficult. Assistance is required to transport, or use restroom. Assistance to reach the emergency department will be required. Tasks requiring coordination, such as bathing and getting dressed become very difficult.   Disabling pain 8 Self-care is no longer possible. At this level, pain is disabling. The individual is unable to do even the most "basic" activities such as walking, eating, bathing, dressing, transferring to a bed, or toileting. Fine motor skills are lost. It is difficult to think clearly.   Incapacitating pain 9 Pain becomes incapacitating. Thought processing is no longer possible. Difficult to remember your own name. Control of movement and coordination are lost.  The worst pain imaginable 10 At this level, most patients pass out from pain. When this level is reached, collapse of the autonomic nervous system occurs, leading to a sudden drop in  blood pressure and heart rate. This in turn results in a temporary and dramatic drop in blood flow to the brain, leading to a loss of consciousness. Fainting is one of the body's self defense mechanisms. Passing out puts the brain in a calmed state and causes it to shut down for a while, in order to begin the healing process.    Summary: 1. Refer to this scale when providing [place] with your pain level. 2. Be accurate and careful when reporting your pain level. This will help with your care. 3. Over-reporting your pain level will lead to loss of credibility. 4. Even a level of 1/10 means that there is pain and will be treated at our facility. 5. High, inaccurate reporting will be documented as "Symptom Exaggeration", leading to loss of credibility and suspicions of possible secondary gains such as obtaining more narcotics, or wanting to appear disabled, for fraudulent reasons. 6. Only pain levels of 5 or below will be seen at our facility. 7. Pain levels of 6 and above will be sent to the Emergency Department and the appointment cancelled. ____________________________________________________________________________________________   GENERAL RISKS AND COMPLICATIONS  What are the risk, side effects and possible complications? Generally speaking, most procedures are safe.  However, with any procedure there are risks, side effects, and the possibility of complications.  The risks and complications are dependent upon the sites that are lesioned, or the type of nerve block to be performed.  The closer the procedure is to the spine, the more serious the risks are.  Great care is taken when placing the radio frequency needles, block needles or lesioning probes, but sometimes complications can occur. 1. Infection: Any time there is an injection through the skin, there is a risk of infection.  This is why sterile conditions are used for these blocks.  There are four possible types of infection. 1. Localized  skin infection. 2. Central Nervous System Infection-This can be in the form of Meningitis, which can be deadly. 3. Epidural Infections-This can be in the form of an epidural abscess, which can cause pressure inside of the spine, causing compression of the spinal cord with subsequent paralysis. This would require an emergency surgery to decompress, and there are no guarantees that the patient would recover from the paralysis. 4. Discitis-This is an infection of the intervertebral discs.  It occurs in about 1% of discography procedures.  It is difficult to treat and it may lead to surgery.        2. Pain: the needles have to go through skin and soft tissues, will cause soreness.       3. Damage to internal structures:  The nerves to be lesioned may be near blood vessels or    other nerves which can be potentially damaged.       4. Bleeding: Bleeding is more common if the patient is taking blood thinners such as  aspirin, Coumadin, Ticiid, Plavix, etc., or if he/she have some genetic predisposition  such as hemophilia. Bleeding into the spinal canal can cause compression of the spinal  cord with subsequent paralysis.  This would require an emergency surgery to  decompress and there are no guarantees that the patient would recover from the  paralysis.       5. Pneumothorax:  Puncturing of a lung  is a possibility, every time a needle is introduced in  the area of the chest or upper back.  Pneumothorax refers to free air around the  collapsed lung(s), inside of the thoracic cavity (chest cavity).  Another two possible  complications related to a similar event would include: Hemothorax and Chylothorax.   These are variations of the Pneumothorax, where instead of air around the collapsed  lung(s), you may have blood or chyle, respectively.       6. Spinal headaches: They may occur with any procedures in the area of the spine.       7. Persistent CSF (Cerebro-Spinal Fluid) leakage: This is a rare problem, but may  occur  with prolonged intrathecal or epidural catheters either due to the formation of a fistulous  track or a dural tear.       8. Nerve damage: By working so close to the spinal cord, there is always a possibility of  nerve damage, which could be as serious as a permanent spinal cord injury with  paralysis.       9. Death:  Although rare, severe deadly allergic reactions known as "Anaphylactic  reaction" can occur to any of the medications used.      10. Worsening of the symptoms:  We can always make thing worse.  What are the chances of something like this happening? Chances of any of this occuring are extremely low.  By statistics, you have more of a chance of getting killed in a motor vehicle accident: while driving to the hospital than any of the above occurring .  Nevertheless, you should be aware that they are possibilities.  In general, it is similar to taking a shower.  Everybody knows that you can slip, hit your head and get killed.  Does that mean that you should not shower again?  Nevertheless always keep in mind that statistics do not mean anything if you happen to be on the wrong side of them.  Even if a procedure has a 1 (one) in a 1,000,000 (million) chance of going wrong, it you happen to be that one..Also, keep in mind that by statistics, you have more of a chance of having something go wrong when taking medications.  Who should not have this procedure? If you are on a blood thinning medication (e.g. Coumadin, Plavix, see list of "Blood Thinners"), or if you have an active infection going on, you should not have the procedure.  If you are taking any blood thinners, please inform your physician.  How should I prepare for this procedure?  Do not eat or drink anything at least six hours prior to the procedure.  Bring a driver with you .  It cannot be a taxi.  Come accompanied by an adult that can drive you back, and that is strong enough to help you if your legs get weak or numb from  the local anesthetic.  Take all of your medicines the morning of the procedure with just enough water to swallow them.  If you have diabetes, make sure that you are scheduled to have your procedure done first thing in the morning, whenever possible.  If you have diabetes, take only half of your insulin dose and notify our nurse that you have done so as soon as you arrive at the clinic.  If you are diabetic, but only take blood sugar pills (oral hypoglycemic), then do not take them on the morning of your procedure.  You may take them after  you have had the procedure.  Do not take aspirin or any aspirin-containing medications, at least eleven (11) days prior to the procedure.  They may prolong bleeding.  Wear loose fitting clothing that may be easy to take off and that you would not mind if it got stained with Betadine or blood.  Do not wear any jewelry or perfume  Remove any nail coloring.  It will interfere with some of our monitoring equipment.  NOTE: Remember that this is not meant to be interpreted as a complete list of all possible complications.  Unforeseen problems may occur.  BLOOD THINNERS The following drugs contain aspirin or other products, which can cause increased bleeding during surgery and should not be taken for 2 weeks prior to and 1 week after surgery.  If you should need take something for relief of minor pain, you may take acetaminophen which is found in Tylenol,m Datril, Anacin-3 and Panadol. It is not blood thinner. The products listed below are.  Do not take any of the products listed below in addition to any listed on your instruction sheet.  A.P.C or A.P.C with Codeine Codeine Phosphate Capsules #3 Ibuprofen Ridaura  ABC compound Congesprin Imuran rimadil  Advil Cope Indocin Robaxisal  Alka-Seltzer Effervescent Pain Reliever and Antacid Coricidin or Coricidin-D  Indomethacin Rufen  Alka-Seltzer plus Cold Medicine Cosprin Ketoprofen S-A-C Tablets  Anacin Analgesic  Tablets or Capsules Coumadin Korlgesic Salflex  Anacin Extra Strength Analgesic tablets or capsules CP-2 Tablets Lanoril Salicylate  Anaprox Cuprimine Capsules Levenox Salocol  Anexsia-D Dalteparin Magan Salsalate  Anodynos Darvon compound Magnesium Salicylate Sine-off  Ansaid Dasin Capsules Magsal Sodium Salicylate  Anturane Depen Capsules Marnal Soma  APF Arthritis pain formula Dewitt's Pills Measurin Stanback  Argesic Dia-Gesic Meclofenamic Sulfinpyrazone  Arthritis Bayer Timed Release Aspirin Diclofenac Meclomen Sulindac  Arthritis pain formula Anacin Dicumarol Medipren Supac  Analgesic (Safety coated) Arthralgen Diffunasal Mefanamic Suprofen  Arthritis Strength Bufferin Dihydrocodeine Mepro Compound Suprol  Arthropan liquid Dopirydamole Methcarbomol with Aspirin Synalgos  ASA tablets/Enseals Disalcid Micrainin Tagament  Ascriptin Doan's Midol Talwin  Ascriptin A/D Dolene Mobidin Tanderil  Ascriptin Extra Strength Dolobid Moblgesic Ticlid  Ascriptin with Codeine Doloprin or Doloprin with Codeine Momentum Tolectin  Asperbuf Duoprin Mono-gesic Trendar  Aspergum Duradyne Motrin or Motrin IB Triminicin  Aspirin plain, buffered or enteric coated Durasal Myochrisine Trigesic  Aspirin Suppositories Easprin Nalfon Trillsate  Aspirin with Codeine Ecotrin Regular or Extra Strength Naprosyn Uracel  Atromid-S Efficin Naproxen Ursinus  Auranofin Capsules Elmiron Neocylate Vanquish  Axotal Emagrin Norgesic Verin  Azathioprine Empirin or Empirin with Codeine Normiflo Vitamin E  Azolid Emprazil Nuprin Voltaren  Bayer Aspirin plain, buffered or children's or timed BC Tablets or powders Encaprin Orgaran Warfarin Sodium  Buff-a-Comp Enoxaparin Orudis Zorpin  Buff-a-Comp with Codeine Equegesic Os-Cal-Gesic   Buffaprin Excedrin plain, buffered or Extra Strength Oxalid   Bufferin Arthritis Strength Feldene Oxphenbutazone   Bufferin plain or Extra Strength Feldene Capsules Oxycodone with Aspirin    Bufferin with Codeine Fenoprofen Fenoprofen Pabalate or Pabalate-SF   Buffets II Flogesic Panagesic   Buffinol plain or Extra Strength Florinal or Florinal with Codeine Panwarfarin   Buf-Tabs Flurbiprofen Penicillamine   Butalbital Compound Four-way cold tablets Penicillin   Butazolidin Fragmin Pepto-Bismol   Carbenicillin Geminisyn Percodan   Carna Arthritis Reliever Geopen Persantine   Carprofen Gold's salt Persistin   Chloramphenicol Goody's Phenylbutazone   Chloromycetin Haltrain Piroxlcam   Clmetidine heparin Plaquenil   Cllnoril Hyco-pap Ponstel   Clofibrate Hydroxy chloroquine Propoxyphen  Before stopping any of these medications, be sure to consult the physician who ordered them.  Some, such as Coumadin (Warfarin) are ordered to prevent or treat serious conditions such as "deep thrombosis", "pumonary embolisms", and other heart problems.  The amount of time that you may need off of the medication may also vary with the medication and the reason for which you were taking it.  If you are taking any of these medications, please make sure you notify your pain physician before you undergo any procedures.         Epidural Steroid Injection Patient Information  Description: The epidural space surrounds the nerves as they exit the spinal cord.  In some patients, the nerves can be compressed and inflamed by a bulging disc or a tight spinal canal (spinal stenosis).  By injecting steroids into the epidural space, we can bring irritated nerves into direct contact with a potentially helpful medication.  These steroids act directly on the irritated nerves and can reduce swelling and inflammation which often leads to decreased pain.  Epidural steroids may be injected anywhere along the spine and from the neck to the low back depending upon the location of your pain.   After numbing the skin with local anesthetic (like Novocaine), a small needle is passed into the epidural space slowly.   You may experience a sensation of pressure while this is being done.  The entire block usually last less than 10 minutes.  Conditions which may be treated by epidural steroids:   Low back and leg pain  Neck and arm pain  Spinal stenosis  Post-laminectomy syndrome  Herpes zoster (shingles) pain  Pain from compression fractures  Preparation for the injection:  1. Do not eat any solid food or dairy products within 8 hours of your appointment.  2. You may drink clear liquids up to 3 hours before appointment.  Clear liquids include water, black coffee, juice or soda.  No milk or cream please. 3. You may take your regular medication, including pain medications, with a sip of water before your appointment  Diabetics should hold regular insulin (if taken separately) and take 1/2 normal NPH dos the morning of the procedure.  Carry some sugar containing items with you to your appointment. 4. A driver must accompany you and be prepared to drive you home after your procedure.  5. Bring all your current medications with your. 6. An IV may be inserted and sedation may be given at the discretion of the physician.   7. A blood pressure cuff, EKG and other monitors will often be applied during the procedure.  Some patients may need to have extra oxygen administered for a short period. 8. You will be asked to provide medical information, including your allergies, prior to the procedure.  We must know immediately if you are taking blood thinners (like Coumadin/Warfarin)  Or if you are allergic to IV iodine contrast (dye). We must know if you could possible be pregnant.  Possible side-effects:  Bleeding from needle site  Infection (rare, may require surgery)  Nerve injury (rare)  Numbness & tingling (temporary)  Difficulty urinating (rare, temporary)  Spinal headache ( a headache worse with upright posture)  Light -headedness (temporary)  Pain at injection site (several days)  Decreased  blood pressure (temporary)  Weakness in arm/leg (temporary)  Pressure sensation in back/neck (temporary)  Call if you experience:  Fever/chills associated with headache or increased back/neck pain.  Headache worsened by an upright position.  New onset  weakness or numbness of an extremity below the injection site  Hives or difficulty breathing (go to the emergency room)  Inflammation or drainage at the infection site  Severe back/neck pain  Any new symptoms which are concerning to you  Please note:  Although the local anesthetic injected can often make your back or neck feel good for several hours after the injection, the pain will likely return.  It takes 3-7 days for steroids to work in the epidural space.  You may not notice any pain relief for at least that one week.  If effective, we will often do a series of three injections spaced 3-6 weeks apart to maximally decrease your pain.  After the initial series, we generally will wait several months before considering a repeat injection of the same type.  If you have any questions, please call [phone] [facility]

## 2017-09-30 NOTE — Progress Notes (Signed)
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 Pertinent Problem List: The primary encounter diagnosis was Chronic low back pain (Secondary Area of Pain) (Bilateral) (R>L) with bilateral sciatica. Diagnoses of Lumbar Facet Syndrome (Bilateral), Chronic lower extremity pain (Tertiary Area of Pain) (Bilateral) (R>L), Failed back surgical syndrome (x2), and Chronic pain syndrome were also pertinent to this visit.  Status Diagnosis  Improving Persistent Persistent 1. 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 Lumbar Facet Syndrome (Bilateral)   3. Chronic lower extremity pain (Tertiary Area of Pain) (Bilateral) (R>L)   4. Failed back surgical syndrome (x2)   5. Chronic pain syndrome     Problems updated and reviewed during this visit: Problem  Failed back surgical syndrome (x2)   Plan of Care  Pharmacotherapy (Medications Ordered): Meds ordered this encounter  Medications  . HYDROcodone-acetaminophen (NORCO/VICODIN) 5-325 MG tablet    Sig: Take 1 tablet by mouth 2 (two) times  daily as needed for severe pain.    Dispense:  60 tablet    Refill:  0    Do not place this medication, or any other prescription from our practice, on "Automatic Refill". Patient may have prescription filled one day early if pharmacy is closed on scheduled refill date. Do not fill until: 09/30/17 To last until: 10/30/17   Medications administered today: Carrie Gardner. Riles had no medications administered during this visit.   Procedure Orders     Caudal Epidural Injection Lab Orders  No laboratory test(s) ordered today   Imaging Orders  No imaging studies ordered today   Referral Orders  No referral(s) requested today    Interventional management options: Planned, scheduled, and/or pending:   Diagnostic Caudal ESI #1 + Epidurogram #1, under fluoro and IV sedation.   Considering:   Diagnostic midline CESI  Diagnostic bilateral cervical facet block  Possible bilateral cervical facet RFA  Diagnostic bilateral lumbar facet nerve block  Possiblebilateral lumbar facet RFA  Diagnostic bilateral L4 transforaminal LESI  Diagnostic right-sided L5-S1 LESI  Diagnostic right-sided caudal epidural steroid injection    Palliative PRN treatment(s):   None at this time   Provider-requested follow-up: Return for Procedure (w/ sedation): (R) Caudal ESI #1.  Future Appointments  Date Time Provider [place]  10/08/2017  9:30 AM Milinda Pointer, MD [facility] None   Primary Care Physician: Ricardo Jericho, NP Location: [facility] Facility Note by: Gaspar Cola, MD Date: 09/30/2017; Time: 1:10 PM

## 2017-09-30 NOTE — Progress Notes (Signed)
Nursing Pain Medication Assessment:  Safety precautions to be maintained throughout the outpatient stay will include: orient to surroundings, keep bed in low position, maintain call bell within reach at all times, provide assistance with transfer out of bed and ambulation.  Medication Inspection Compliance: Pill count conducted under aseptic conditions, in front of the patient. Neither the pills nor the bottle was removed from the patient's sight at any time. Once count was completed pills were immediately returned to the patient in their original bottle.  Medication: Hydrocodone/APAP Pill/Patch Count: 6 of 60 pills remain Pill/Patch Appearance: Markings consistent with prescribed medication Bottle Appearance: Standard pharmacy container. Clearly labeled. Filled Date: 04 / 08 / 2019 Last Medication intake:  Yesterday

## 2017-10-08 ENCOUNTER — Other Ambulatory Visit: Payer: Self-pay

## 2017-10-08 ENCOUNTER — Ambulatory Visit
Admission: RE | Admit: 2017-10-08 | Discharge: 2017-10-08 | Disposition: A | Discharge status: Home / Self Care | Payer: Medicare Other | Source: Ambulatory Visit | Attending: Pain Medicine | Admitting: Pain Medicine

## 2017-10-08 ENCOUNTER — Ambulatory Visit (HOSPITAL_BASED_OUTPATIENT_CLINIC_OR_DEPARTMENT_OTHER): Payer: Medicare Other | Admitting: Pain Medicine

## 2017-10-08 ENCOUNTER — Encounter: Payer: Self-pay | Admitting: Pain Medicine

## 2017-10-08 VITALS — BP 160/75 | HR 67 | Temp 99.4°F | Resp 18 | Ht 62.0 in | Wt 226.0 lb

## 2017-10-08 DIAGNOSIS — M961 Postlaminectomy syndrome, not elsewhere classified: Secondary | ICD-10-CM

## 2017-10-08 DIAGNOSIS — M5137 Other intervertebral disc degeneration, lumbosacral region: Secondary | ICD-10-CM | POA: Diagnosis not present

## 2017-10-08 DIAGNOSIS — Z9884 Bariatric surgery status: Secondary | ICD-10-CM | POA: Insufficient documentation

## 2017-10-08 DIAGNOSIS — M79605 Pain in left leg: Secondary | ICD-10-CM | POA: Insufficient documentation

## 2017-10-08 DIAGNOSIS — M79604 Pain in right leg: Secondary | ICD-10-CM

## 2017-10-08 DIAGNOSIS — M5117 Intervertebral disc disorders with radiculopathy, lumbosacral region: Secondary | ICD-10-CM | POA: Insufficient documentation

## 2017-10-08 DIAGNOSIS — Z950 Presence of cardiac pacemaker: Secondary | ICD-10-CM | POA: Insufficient documentation

## 2017-10-08 DIAGNOSIS — G8929 Other chronic pain: Secondary | ICD-10-CM

## 2017-10-08 DIAGNOSIS — Z794 Long term (current) use of insulin: Secondary | ICD-10-CM | POA: Diagnosis not present

## 2017-10-08 DIAGNOSIS — M5416 Radiculopathy, lumbar region: Secondary | ICD-10-CM

## 2017-10-08 MED ORDER — FENTANYL CITRATE (PF) 100 MCG/2ML IJ SOLN
25.0000 ug | INTRAMUSCULAR | Status: DC | PRN
  Administered 2017-10-08: 50 ug via INTRAVENOUS
  Filled 2017-10-08: qty 2

## 2017-10-08 MED ORDER — SODIUM CHLORIDE 0.9% FLUSH
2.0000 mL | Freq: Once | INTRAVENOUS | Status: AC
  Administered 2017-10-08: 2 mL

## 2017-10-08 MED ORDER — IOPAMIDOL (ISOVUE-M 200) INJECTION 41%: 10.0000 mL | Freq: Once | INTRAMUSCULAR | Status: DC

## 2017-10-08 MED ORDER — LACTATED RINGERS IV SOLN
1000.0000 mL | Freq: Once | INTRAVENOUS | Status: AC
  Administered 2017-10-08: 1000 mL via INTRAVENOUS

## 2017-10-08 MED ORDER — TRIAMCINOLONE ACETONIDE 40 MG/ML IJ SUSP
40.0000 mg | Freq: Once | INTRAMUSCULAR | Status: AC
  Administered 2017-10-08: 40 mg
  Filled 2017-10-08: qty 1

## 2017-10-08 MED ORDER — LIDOCAINE HCL 2 % IJ SOLN
20.0000 mL | Freq: Once | INTRAMUSCULAR | Status: AC
  Administered 2017-10-08: 400 mg
  Filled 2017-10-08: qty 40

## 2017-10-08 MED ORDER — ROPIVACAINE HCL 2 MG/ML IJ SOLN
2.0000 mL | Freq: Once | INTRAMUSCULAR | Status: AC
  Administered 2017-10-08: 2 mL via EPIDURAL
  Filled 2017-10-08: qty 10

## 2017-10-08 MED ORDER — MIDAZOLAM HCL 5 MG/5ML IJ SOLN
1.0000 mg | INTRAMUSCULAR | Status: DC | PRN
  Administered 2017-10-08: 2 mg via INTRAVENOUS
  Filled 2017-10-08: qty 5

## 2017-10-08 NOTE — Progress Notes (Signed)
Patient's Name: Carrie Gardner  MRN: [id number]  Referring Provider: Titus Mould*  DOB: 09/02/1950  PCP: Titus Mould, NP  DOS: 10/08/2017  Note by: Oswaldo Done, MD  Service setting: Ambulatory outpatient  Specialty: Interventional Pain Management  Patient type: Established  Location: [facility] (AMB) Pain Management Facility  Visit type: Interventional Procedure   Primary Reason for Visit: Interventional Pain Management Treatment. CC: Leg Pain (right)  Procedure:  Anesthesia, Analgesia, Anxiolysis:  Type: Diagnostic Epidural Steroid Injection #1 Region: Caudal Level: Sacrococcygeal   Laterality: Midline aiming at the right  Type: Moderate (Conscious) Sedation combined with Local Anesthesia Indication(s): Analgesia and Anxiety Route: Intravenous (IV) IV Access: Secured Sedation: Meaningful verbal contact was maintained at all times during the procedure  Local Anesthetic: Lidocaine 1-2%   Indications: 1. DDD (degenerative disc disease), lumbosacral   2. Failed back surgical syndrome (x2)   3. Chronic lower extremity pain (Tertiary Area of Pain) (Bilateral) (R>L)   4. Chronic lumbar radiculopathy    Pain Score: Pre-procedure: 5 /10 Post-procedure: 0-No pain/10  Pre-op Assessment:  Carrie Gardner is a 67 y.o. (year old), female patient, seen today for interventional treatment. She  has a past surgical history that includes Spine surgery; Cholecystectomy; Abdominal hysterectomy; Pacemaker insertion; Laparoscopic gastric banding; Laparoscopic gastric band removal with laparoscopic gastric sleeve resection; LEFT HEART CATH AND CORONARY ANGIOGRAPHY (Left, 11/08/2016); and Colonoscopy. Carrie Gardner has a current medication list which includes the following prescription(s): albuterol, aspirin, aspirin-salicylamide-caffeine, atorvastatin, gnp calcium 1200, diclofenac sodium, furosemide, hydrocodone-acetaminophen, insulin aspart, insulin degludec, insulin lispro, levothyroxine,  lisinopril, and omeprazole, and the following Facility-Administered Medications: fentanyl, iopamidol, lactated ringers, and midazolam. Her primarily concern today is the Leg Pain (right)  Initial Vital Signs:  Pulse/HCG Rate: 67ECG Heart Rate: 68 Temp: 97.8 F (36.6 C) Resp: 19 BP: (!) 155/78 SpO2: 100 %  BMI: Estimated body mass index is 41.34 kg/m as calculated from the following:   Height as of this encounter: 5\' 2"  (1.575 m).   Weight as of this encounter: 226 lb (102.5 kg).  Risk Assessment: Allergies: Reviewed. She is allergic to metformin; metformin and related; pregabalin; ibuprofen; iodinated diagnostic agents; lisinopril; mobic [meloxicam]; oxycodone; oxycodone-acetaminophen; and tramadol.  Allergy Precautions: None required Coagulopathies: Reviewed. None identified.  Blood-thinner therapy: None at this time Active Infection(s): Reviewed. None identified. Carrie Gardner is afebrile  Site Confirmation: Carrie Gardner was asked to confirm the procedure and laterality before marking the site Procedure checklist: Completed Consent: Before the procedure and under the influence of no sedative(s), amnesic(s), or anxiolytics, the patient was informed of the treatment options, risks and possible complications. To fulfill our ethical and legal obligations, as recommended by the American Medical Association's Code of Ethics, I have informed the patient of my clinical impression; the nature and purpose of the treatment or procedure; the risks, benefits, and possible complications of the intervention; the alternatives, including doing nothing; the risk(s) and benefit(s) of the alternative treatment(s) or procedure(s); and the risk(s) and benefit(s) of doing nothing. The patient was provided information about the general risks and possible complications associated with the procedure. These may include, but are not limited to: failure to achieve desired goals, infection, bleeding, organ or nerve damage,  allergic reactions, paralysis, and death. In addition, the patient was informed of those risks and complications associated to Spine-related procedures, such as failure to decrease pain; infection (i.e.: Meningitis, epidural or intraspinal abscess); bleeding (i.e.: epidural hematoma, subarachnoid hemorrhage, or any other type of intraspinal or peri-dural bleeding); organ  or nerve damage (i.e.: Any type of peripheral nerve, nerve root, or spinal cord injury) with subsequent damage to sensory, motor, and/or autonomic systems, resulting in permanent pain, numbness, and/or weakness of one or several areas of the body; allergic reactions; (i.e.: anaphylactic reaction); and/or death. Furthermore, the patient was informed of those risks and complications associated with the medications. These include, but are not limited to: allergic reactions (i.e.: anaphylactic or anaphylactoid reaction(s)); adrenal axis suppression; blood sugar elevation that in diabetics may result in ketoacidosis or comma; water retention that in patients with history of congestive heart failure may result in shortness of breath, pulmonary edema, and decompensation with resultant heart failure; weight gain; swelling or edema; medication-induced neural toxicity; particulate matter embolism and blood vessel occlusion with resultant organ, and/or nervous system infarction; and/or aseptic necrosis of one or more joints. Finally, the patient was informed that Medicine is not an exact science; therefore, there is also the possibility of unforeseen or unpredictable risks and/or possible complications that may result in a catastrophic outcome. The patient indicated having understood very clearly. We have given the patient no guarantees and we have made no promises. Enough time was given to the patient to ask questions, all of which were answered to the patient's satisfaction. Carrie Gardner has indicated that she wanted to continue with the  procedure. Attestation: I, the ordering provider, attest that I have discussed with the patient the benefits, risks, side-effects, alternatives, likelihood of achieving goals, and potential problems during recovery for the procedure that I have provided informed consent. Date  Time: 10/08/2017  9:53 AM  Pre-Procedure Preparation:  Monitoring: As per clinic protocol. Respiration, ETCO2, SpO2, BP, heart rate and rhythm monitor placed and checked for adequate function Safety Precautions: Patient was assessed for positional comfort and pressure points before starting the procedure. Time-out: I initiated and conducted the "Time-out" before starting the procedure, as per protocol. The patient was asked to participate by confirming the accuracy of the "Time Out" information. Verification of the correct person, site, and procedure were performed and confirmed by me, the nursing staff, and the patient. "Time-out" conducted as per Joint Commission's Universal Protocol (UP.01.01.01). Time: 1052  Description of Procedure Process:   Position: Prone Target Area: Caudal Epidural Canal. Approach: Midline approach. Area Prepped: Entire Posterior Sacrococcygeal Region Prepping solution: ChloraPrep (2% chlorhexidine gluconate and 70% isopropyl alcohol) Safety Precautions: Aspiration looking for blood return was conducted prior to all injections. At no point did we inject any substances, as a needle was being advanced. No attempts were made at seeking any paresthesias. Safe injection practices and needle disposal techniques used. Medications properly checked for expiration dates. SDV (single dose vial) medications used. Description of the Procedure: Protocol guidelines were followed. The patient was placed in position over the fluoroscopy table. The target area was identified and the area prepped in the usual manner. Skin desensitized using vapocoolant spray. Skin & deeper tissues infiltrated with local anesthetic.  Appropriate amount of time allowed to pass for local anesthetics to take effect. The procedure needles were then advanced to the target area. Proper needle placement secured. Negative aspiration confirmed. Solution injected in intermittent fashion, asking for systemic symptoms every 0.5cc of injectate. The needles were then removed and the area cleansed, making sure to leave some of the prepping solution back to take advantage of its long term bactericidal properties. Vitals:   10/08/17 1059 10/08/17 1109 10/08/17 1119 10/08/17 1130  BP: 125/81 (!) 172/79 (!) 168/78 (!) 160/75  Pulse:  Resp: 20 20 16 18   Temp:  (!) 97.5 F (36.4 C)  99.4 F (37.4 C)  SpO2: 95% 97% 96% 96%  Weight:      Height:        Start Time: 1052 hrs. End Time: 1059 hrs. Materials:  Needle(s) Type: Epidural needle Gauge: 17G Length: 3.5-in Medication(s): Please see orders for medications and dosing details.  Imaging Guidance (Spinal):  Type of Imaging Technique: Fluoroscopy Guidance (Spinal) Indication(s): Assistance in needle guidance and placement for procedures requiring needle placement in or near specific anatomical locations not easily accessible without such assistance. Exposure Time: Please see nurses notes. Contrast: Before injecting any contrast, we confirmed that the patient did not have an allergy to iodine, shellfish, or radiological contrast. Once satisfactory needle placement was completed at the desired level, radiological contrast was injected. Contrast injected under live fluoroscopy. No contrast complications. See chart for type and volume of contrast used. Fluoroscopic Guidance: I was personally present during the use of fluoroscopy. "Tunnel Vision Technique" used to obtain the best possible view of the target area. Parallax error corrected before commencing the procedure. "Direction-depth-direction" technique used to introduce the needle under continuous pulsed fluoroscopy. Once target was  reached, antero-posterior, oblique, and lateral fluoroscopic projection used confirm needle placement in all planes. Images permanently stored in EMR. Interpretation: I personally interpreted the imaging intraoperatively. Adequate needle placement confirmed in multiple planes. Appropriate spread of contrast into desired area was observed. No evidence of afferent or efferent intravascular uptake. No intrathecal or subarachnoid spread observed. Permanent images saved into the patient's record.  Antibiotic Prophylaxis:   Anti-infectives (From admission, onward)   None     Indication(s): None identified  Post-operative Assessment:  Post-procedure Vital Signs:  Pulse/HCG Rate: 6778 Temp: 99.4 F (37.4 C) Resp: 18 BP: (!) 160/75 SpO2: 96 %  EBL: None  Complications: No immediate post-treatment complications observed by team, or reported by patient.  Note: The patient tolerated the entire procedure well. A repeat set of vitals were taken after the procedure and the patient was kept under observation following institutional policy, for this type of procedure. Post-procedural neurological assessment was performed, showing return to baseline, prior to discharge. The patient was provided with post-procedure discharge instructions, including a section on how to identify potential problems. Should any problems arise concerning this procedure, the patient was given instructions to immediately contact us, at any time, without hesitation. In any case, we plan to contact the patient by telephone for a follow-up status report regarding this interventional procedure.  Comments:  No additional relevant information.  Plan of Care    Imaging Orders     DG C-Arm 1-60 Min-No Report  Procedure Orders     Caudal Epidural Injection  Medications ordered for procedure: Meds ordered this encounter  Medications  . iopamidol (ISOVUE-M) 41 % intrathecal injection 10 mL    Must be Myelogram-compatible. If not  available, you may substitute with a water-soluble, non-ionic, hypoallergenic, myelogram-compatible radiological contrast medium.  Marland Kitchen lidocaine (XYLOCAINE) 2 % (with pres) injection 400 mg  . midazolam (VERSED) 5 MG/5ML injection 1-2 mg    Make sure Flumazenil is available in the pyxis when using this medication. If oversedation occurs, administer 0.2 mg IV over 15 sec. If after 45 sec no response, administer 0.2 mg again over 1 min; may repeat at 1 min intervals; not to exceed 4 doses (1 mg)  . fentaNYL (SUBLIMAZE) injection 25-50 mcg    Make sure Narcan is available in the pyxis  when using this medication. In the event of respiratory depression (RR< 8/min): Titrate NARCAN (naloxone) in increments of 0.1 to 0.2 mg IV at 2-3 minute intervals, until desired degree of reversal.  . lactated ringers infusion 1,000 mL  . sodium chloride flush (NS) 0.9 % injection 2 mL  . ropivacaine (PF) 2 mg/mL (0.2%) (NAROPIN) injection 2 mL  . triamcinolone acetonide (KENALOG-40) injection 40 mg   Medications administered: We administered lidocaine, midazolam, fentaNYL, lactated ringers, sodium chloride flush, ropivacaine (PF) 2 mg/mL (0.2%), and triamcinolone acetonide.  See the medical record for exact dosing, route, and time of administration.  New Prescriptions   No medications on file   Disposition: Discharge home  Discharge Date & Time: 10/08/2017; 1131 hrs.   Physician-requested Follow-up: Return for post-procedure eval (2 wks), w/ Dr. Laban Emperor.  Future Appointments  Date Time Provider Department Center  10/30/2017 10:15 AM Delano Metz, MD [facility] None   Primary Care Physician: Titus Mould, NP Location: [facility] Note by: Oswaldo Done, MD Date: 10/08/2017; Time: 11:36 AM  Disclaimer:  Medicine is not an exact science. The only guarantee in medicine is that nothing is guaranteed. It is important to note that the decision to proceed with this  intervention was based on the information collected from the patient. The Data and conclusions were drawn from the patient's questionnaire, the interview, and the physical examination. Because the information was provided in large part by the patient, it cannot be guaranteed that it has not been purposely or unconsciously manipulated. Every effort has been made to obtain as much relevant data as possible for this evaluation. It is important to note that the conclusions that lead to this procedure are derived in large part from the available data. Always take into account that the treatment will also be dependent on availability of resources and existing treatment guidelines, considered by other Pain Management Practitioners as being common knowledge and practice, at the time of the intervention. For Medico-Legal purposes, it is also important to point out that variation in procedural techniques and pharmacological choices are the acceptable norm. The indications, contraindications, technique, and results of the above procedure should only be interpreted and judged by a Board-Certified Interventional Pain Specialist with extensive familiarity and expertise in the same exact procedure and technique.

## 2017-10-08 NOTE — Patient Instructions (Signed)

## 2017-10-17 ENCOUNTER — Encounter: Payer: Self-pay | Admitting: Emergency Medicine

## 2017-10-17 ENCOUNTER — Inpatient Hospital Stay
Admission: EM | Admit: 2017-10-17 | Discharge: 2017-10-19 | DRG: 639 | Disposition: A | Discharge status: Home / Self Care | Payer: Medicare Other | Attending: Internal Medicine | Admitting: Internal Medicine

## 2017-10-17 ENCOUNTER — Other Ambulatory Visit: Payer: Self-pay

## 2017-10-17 DIAGNOSIS — E039 Hypothyroidism, unspecified: Secondary | ICD-10-CM | POA: Diagnosis present

## 2017-10-17 DIAGNOSIS — Z818 Family history of other mental and behavioral disorders: Secondary | ICD-10-CM | POA: Diagnosis not present

## 2017-10-17 DIAGNOSIS — E875 Hyperkalemia: Secondary | ICD-10-CM | POA: Diagnosis not present

## 2017-10-17 DIAGNOSIS — E11649 Type 2 diabetes mellitus with hypoglycemia without coma: Secondary | ICD-10-CM | POA: Diagnosis not present

## 2017-10-17 DIAGNOSIS — I10 Essential (primary) hypertension: Secondary | ICD-10-CM | POA: Diagnosis not present

## 2017-10-17 DIAGNOSIS — Z7982 Long term (current) use of aspirin: Secondary | ICD-10-CM

## 2017-10-17 DIAGNOSIS — Z79899 Other long term (current) drug therapy: Secondary | ICD-10-CM

## 2017-10-17 DIAGNOSIS — Z823 Family history of stroke: Secondary | ICD-10-CM

## 2017-10-17 DIAGNOSIS — E1142 Type 2 diabetes mellitus with diabetic polyneuropathy: Secondary | ICD-10-CM | POA: Diagnosis not present

## 2017-10-17 DIAGNOSIS — Z90710 Acquired absence of both cervix and uterus: Secondary | ICD-10-CM | POA: Diagnosis not present

## 2017-10-17 DIAGNOSIS — M797 Fibromyalgia: Secondary | ICD-10-CM | POA: Diagnosis present

## 2017-10-17 DIAGNOSIS — Z794 Long term (current) use of insulin: Secondary | ICD-10-CM | POA: Diagnosis not present

## 2017-10-17 DIAGNOSIS — F411 Generalized anxiety disorder: Secondary | ICD-10-CM | POA: Diagnosis present

## 2017-10-17 DIAGNOSIS — E785 Hyperlipidemia, unspecified: Secondary | ICD-10-CM | POA: Diagnosis present

## 2017-10-17 DIAGNOSIS — Z8673 Personal history of transient ischemic attack (TIA), and cerebral infarction without residual deficits: Secondary | ICD-10-CM

## 2017-10-17 DIAGNOSIS — J449 Chronic obstructive pulmonary disease, unspecified: Secondary | ICD-10-CM | POA: Diagnosis present

## 2017-10-17 DIAGNOSIS — Z79890 Hormone replacement therapy (postmenopausal): Secondary | ICD-10-CM | POA: Diagnosis not present

## 2017-10-17 DIAGNOSIS — Z833 Family history of diabetes mellitus: Secondary | ICD-10-CM

## 2017-10-17 DIAGNOSIS — E162 Hypoglycemia, unspecified: Secondary | ICD-10-CM | POA: Diagnosis present

## 2017-10-17 LAB — COMPREHENSIVE METABOLIC PANEL
ALT: 32 U/L (ref 14–54)
AST: 28 U/L (ref 15–41)
Albumin: 3.9 g/dL (ref 3.5–5.0)
Alkaline Phosphatase: 61 U/L (ref 38–126)
Anion gap: 10 (ref 5–15)
BILIRUBIN TOTAL: 0.6 mg/dL (ref 0.3–1.2)
BUN: 29 mg/dL — AB (ref 6–20)
CO2: 19 mmol/L — ABNORMAL LOW (ref 22–32)
CREATININE: 1.09 mg/dL — AB (ref 0.44–1.00)
Calcium: 9 mg/dL (ref 8.9–10.3)
Chloride: 110 mmol/L (ref 101–111)
GFR calc non Af Amer: 52 mL/min — ABNORMAL LOW (ref >60)
GFR, EST AFRICAN AMERICAN: 60 mL/min — AB (ref >60)
Glucose, Bld: 78 mg/dL (ref 65–99)
POTASSIUM: 5.5 mmol/L — AB (ref 3.5–5.1)
Sodium: 139 mmol/L (ref 135–145)
Total Protein: 7.6 g/dL (ref 6.5–8.1)

## 2017-10-17 LAB — URINALYSIS, COMPLETE (UACMP) WITH MICROSCOPIC
Bilirubin Urine: NEGATIVE
Glucose, UA: >=500 mg/dL — AB
KETONES UR: NEGATIVE mg/dL
Nitrite: NEGATIVE
Protein, ur: NEGATIVE mg/dL
Specific Gravity, Urine: 1.017 (ref 1.005–1.030)
pH: 5 (ref 5.0–8.0)

## 2017-10-17 LAB — CBC WITH DIFFERENTIAL/PLATELET
BASOS ABS: 0 10*3/uL (ref 0–0.1)
Basophils Relative: 1 %
Eosinophils Absolute: 0 10*3/uL (ref 0–0.7)
Eosinophils Relative: 1 %
HEMATOCRIT: 45.2 % (ref 35.0–47.0)
Hemoglobin: 15.2 g/dL (ref 12.0–16.0)
LYMPHS PCT: 17 %
Lymphs Abs: 0.8 10*3/uL — ABNORMAL LOW (ref 1.0–3.6)
MCH: 29 pg (ref 26.0–34.0)
MCHC: 33.7 g/dL (ref 32.0–36.0)
MCV: 86.2 fL (ref 80.0–100.0)
MONO ABS: 0.2 10*3/uL (ref 0.2–0.9)
MONOS PCT: 5 %
NEUTROS ABS: 3.7 10*3/uL (ref 1.4–6.5)
Neutrophils Relative %: 76 %
Platelets: 319 10*3/uL (ref 150–440)
RBC: 5.25 MIL/uL — ABNORMAL HIGH (ref 3.80–5.20)
RDW: 15.1 % — AB (ref 11.5–14.5)
WBC: 4.8 10*3/uL (ref 3.6–11.0)

## 2017-10-17 LAB — GLUCOSE, CAPILLARY
GLUCOSE-CAPILLARY: 89 mg/dL (ref 65–99)
GLUCOSE-CAPILLARY: 15 mg/dL — AB (ref 65–99)
GLUCOSE-CAPILLARY: 57 mg/dL — AB (ref 65–99)
Glucose-Capillary: 152 mg/dL — ABNORMAL HIGH (ref 65–99)
Glucose-Capillary: 201 mg/dL — ABNORMAL HIGH (ref 65–99)
Glucose-Capillary: 138 mg/dL — ABNORMAL HIGH (ref 65–99)

## 2017-10-17 LAB — TROPONIN I: Troponin I: <0.03 ng/mL (ref <0.03)

## 2017-10-17 MED ORDER — GLUCAGON HCL (RDNA) 1 MG IJ SOLR: 0.5000 mg | Freq: Once | INTRAMUSCULAR | Status: DC

## 2017-10-17 MED ORDER — ONDANSETRON HCL 4 MG PO TABS: 4.0000 mg | ORAL_TABLET | Freq: Four times a day (QID) | ORAL | Status: DC | PRN

## 2017-10-17 MED ORDER — GLUCOSE 40 % PO GEL
1.0000 | Freq: Once | ORAL | Status: AC
  Administered 2017-10-17: 37.5 g via ORAL

## 2017-10-17 MED ORDER — DEXTROSE 10 % IV SOLN: INTRAVENOUS | Status: DC

## 2017-10-17 MED ORDER — CALCIUM CARBONATE-VITAMIN D 500-200 MG-UNIT PO TABS
1.0000 | ORAL_TABLET | Freq: Every day | ORAL | Status: DC
  Administered 2017-10-18 – 2017-10-19 (×2): 1 via ORAL
  Filled 2017-10-17 (×3): qty 1

## 2017-10-17 MED ORDER — FUROSEMIDE 20 MG PO TABS
20.0000 mg | ORAL_TABLET | Freq: Every day | ORAL | Status: DC
  Administered 2017-10-18 – 2017-10-19 (×2): 20 mg via ORAL
  Filled 2017-10-17 (×2): qty 1

## 2017-10-17 MED ORDER — ENOXAPARIN SODIUM 40 MG/0.4ML ~~LOC~~ SOLN
40.0000 mg | SUBCUTANEOUS | Status: DC
  Administered 2017-10-18 (×2): 40 mg via SUBCUTANEOUS
  Filled 2017-10-17 (×2): qty 0.4

## 2017-10-17 MED ORDER — GLUCAGON HCL RDNA (DIAGNOSTIC) 1 MG IJ SOLR
INTRAMUSCULAR | Status: AC
  Filled 2017-10-17: qty 1

## 2017-10-17 MED ORDER — ATORVASTATIN CALCIUM 20 MG PO TABS
20.0000 mg | ORAL_TABLET | Freq: Every day | ORAL | Status: DC
  Administered 2017-10-18 – 2017-10-19 (×2): 20 mg via ORAL
  Filled 2017-10-17 (×2): qty 1

## 2017-10-17 MED ORDER — ACETAMINOPHEN 650 MG RE SUPP: 650.0000 mg | Freq: Four times a day (QID) | RECTAL | Status: DC | PRN

## 2017-10-17 MED ORDER — ASPIRIN EC 81 MG PO TBEC
81.0000 mg | DELAYED_RELEASE_TABLET | Freq: Every day | ORAL | Status: DC
  Administered 2017-10-18 – 2017-10-19 (×2): 81 mg via ORAL
  Filled 2017-10-17 (×2): qty 1

## 2017-10-17 MED ORDER — GLUCOSE 40 % PO GEL: 1.0000 | Freq: Once | ORAL | Status: DC

## 2017-10-17 MED ORDER — GLUCAGON HCL RDNA (DIAGNOSTIC) 1 MG IJ SOLR
1.0000 mg | Freq: Once | INTRAMUSCULAR | Status: AC
  Administered 2017-10-17: 1 mg via INTRAMUSCULAR

## 2017-10-17 MED ORDER — LEVOTHYROXINE SODIUM 125 MCG PO TABS
125.0000 ug | ORAL_TABLET | Freq: Every day | ORAL | Status: DC
  Administered 2017-10-18 – 2017-10-19 (×2): 125 ug via ORAL
  Filled 2017-10-17 (×3): qty 1

## 2017-10-17 MED ORDER — HYDROCODONE-ACETAMINOPHEN 5-325 MG PO TABS: 1.0000 | ORAL_TABLET | Freq: Two times a day (BID) | ORAL | Status: DC | PRN

## 2017-10-17 MED ORDER — ACETAMINOPHEN 325 MG PO TABS: 650.0000 mg | ORAL_TABLET | Freq: Four times a day (QID) | ORAL | Status: DC | PRN

## 2017-10-17 MED ORDER — ONDANSETRON HCL 4 MG/2ML IJ SOLN: 4.0000 mg | Freq: Four times a day (QID) | INTRAMUSCULAR | Status: DC | PRN

## 2017-10-17 MED ORDER — AMLODIPINE BESYLATE 5 MG PO TABS
5.0000 mg | ORAL_TABLET | Freq: Every day | ORAL | Status: DC
  Administered 2017-10-18 – 2017-10-19 (×2): 5 mg via ORAL
  Filled 2017-10-17 (×2): qty 1

## 2017-10-17 MED ORDER — DEXTROSE 10 % IV SOLN
INTRAVENOUS | Status: DC
  Administered 2017-10-17: 23:00:00 via INTRAVENOUS

## 2017-10-17 MED ORDER — DEXTROSE 50 % IV SOLN
1.0000 | Freq: Once | INTRAVENOUS | Status: AC
  Administered 2017-10-17: 50 mL via INTRAVENOUS
  Filled 2017-10-17: qty 50

## 2017-10-17 NOTE — ED Triage Notes (Signed)
Pt reports blood sugars have been running low for past month. Tried to call endocrine but they will not see her until June.  Lowest has been 22.  EMS has been to house more than once.  Today BG 53 per pt at home.  Has not been using her novolog or tresiba at all yesterday and still low yesterday and today.  Daughter reports that pt had slurred speech and woke up drenched in sweat this AM with blood sugar 53.

## 2017-10-17 NOTE — ED Notes (Signed)
Pt presents w/ c/o persistent hypoglycemia despite medications changes from PCP and not taking any insulin at all for past 24 hrs. Pt is alert and oriented x4, no complaints of pain other than chronic back pain for which she is treated adequately. Pt has a pacemaker placed, c/o chest pain radiating down L arm prior to arrival. Pt's CBG checked and she was extremely hypoglycemic. Pt w/o c/o other sxs. Denies urinary sxs, fever, diarrhea.

## 2017-10-17 NOTE — H&P (Signed)
[facility] [place] [facility]   PATIENT NAME: Carrie Gardner    MR#:  [id number]  DATE OF BIRTH:  05/04/1951  DATE OF ADMISSION:  10/17/2017  PRIMARY CARE PHYSICIAN: Titus Mould, NP   REQUESTING/REFERRING PHYSICIAN: Dr Sharyn Creamer  CHIEF COMPLAINT:   Chief Complaint  Patient presents with  . Hypoglycemia    HISTORY OF PRESENT ILLNESS:  Carrie Gardner  is a 67 y.o. female with a known history of diabetes on T[place]iba insulin and Humalog insulin.  She states over the last 3 weeks her sugars have been bottoming out.  They mostly bottom out in the morning.  She is on [place] insulin 74 units at night.  This was cut back from 94 units.  She only takes her Humalog if she eats.  She took herself off the [place] last month.  Her boyfriend was over with her and she was out cold on the floor and her sugar was 27.  In the emergency room she had a sugar of 15.  Patient did have some chest pain today but resolved once her sugar came up.  Hospitalist services were contacted for further evaluation.  PAST MEDICAL HISTORY:   Past Medical History:  Diagnosis Date  . Allergy   . Arthritis   . Bradycardia   . Cataract   . Chronic kidney disease   . COPD (chronic obstructive pulmonary disease) (HCC)   . Diabetes mellitus without complication (HCC)   . Fibromyalgia   . Generalized anxiety disorder 02/09/2009  . Hyperlipidemia 10/20/2006  . Hypertension   . Hypothyroidism   . Opiate use 06/03/2015  . Presence of permanent cardiac pacemaker   . Sciatica   . Seizures (HCC)   . Sleep apnea   . Thyroid disease   . TIA (transient ischemic attack)   . Vitiligo     PAST SURGICAL HISTORY:   Past Surgical History:  Procedure Laterality Date  . ABDOMINAL HYSTERECTOMY    . CHOLECYSTECTOMY    . COLONOSCOPY    . LAPAROSCOPIC GASTRIC BAND REMOVAL WITH LAPAROSCOPIC GASTRIC SLEEVE RESECTION    . LAPAROSCOPIC GASTRIC BANDING    . LEFT HEART CATH AND CORONARY  ANGIOGRAPHY Left 11/08/2016   Procedure: Left Heart Cath and Coronary Angiography;  Surgeon: Marcina Millard, MD;  Location: [facility] INVASIVE CV LAB;  Service: Cardiovascular;  Laterality: Left;  . PACEMAKER INSERTION    . SPINE SURGERY      SOCIAL HISTORY:   Social History   Tobacco Use  . Smoking status: Never Smoker  . Smokeless tobacco: Never Used  Substance Use Topics  . Alcohol use: Yes    Alcohol/week: 1.8 oz    Types: 3 Glasses of wine per week    FAMILY HISTORY:   Family History  Problem Relation Age of Onset  . Arthritis Mother   . Cancer Mother   . COPD Mother   . Diabetes Mother   . Vision loss Mother   . Heart disease Mother   . Hypertension Mother   . Kidney disease Mother   . Arthritis Father   . Asthma Father   . Depression Father   . Drug abuse Father   . Vision loss Father   . Hypertension Father   . Stroke Father     DRUG ALLERGIES:   Allergies  Allergen Reactions  . Metformin Anaphylaxis  . Metformin And Related Anaphylaxis  . Pregabalin Anaphylaxis  . Ibuprofen Other (See Comments)    Other reaction(s): Other (See  Comments) Kidney disease Kidney issues Kidney disease  . Iodinated Diagnostic Agents Other (See Comments)    Other reaction(s): Other (See Comments) Patient reports "I can't have dye r/t my kidney problems." Kidney disease  . Lisinopril Other (See Comments)    Low back pain  . Mobic [Meloxicam] Other (See Comments)    Causes stomach cramps.  Patient states she is unable to tolerate.   . Oxycodone Rash  . Oxycodone-Acetaminophen Rash  . Tramadol Other (See Comments) and Itching    Hallucinations hallucinations    REVIEW OF SYSTEMS:  CONSTITUTIONAL: No fever.  Positive for chills and sweats.  Positive for fatigue.  EYES: Positive for blurred vision.  Wears glasses. EARS, NOSE, AND THROAT: No tinnitus or ear pain. No sore throat RESPIRATORY: No cough, shortness of breath, wheezing or hemoptysis.  CARDIOVASCULAR:  Positive for chest pain.  No orthopnea, edema.  GASTROINTESTINAL: Occasional nausea, vomiting.  No diarrhea or abdominal pain. No blood in bowel movements GENITOURINARY: No dysuria, hematuria.  ENDOCRINE: No polyuria, nocturia.  Positive for hypothyroidism HEMATOLOGY: No anemia, easy bruising or bleeding SKIN: No rash or lesion. MUSCULOSKELETAL: Positive for pain in the legs and her back. NEUROLOGIC: Positive for blackouts with a low sugars PSYCHIATRY: No anxiety or depression.   MEDICATIONS AT HOME:   Prior to Admission medications   Medication Sig Start Date End Date Taking? Authorizing Provider  albuterol (PROVENTIL HFA;VENTOLIN HFA) 108 (90 BASE) MCG/ACT inhaler Inhale 2 puffs into the lungs every 6 (six) hours as needed for wheezing or shortness of breath. Reported on 07/26/2015    [provider]  aspirin 81 MG tablet Take 81 mg by mouth daily.    [provider]  Aspirin-Salicylamide-Caffeine (BC HEADACHE PO) Take 1 packet by mouth as needed.    [provider]  atorvastatin (LIPITOR) 20 MG tablet Take 20 mg by mouth daily. Reported on 07/26/2015    [provider]  Calcium Carbonate-Vit D-Min (GNP CALCIUM 1200) 1200-1000 MG-UNIT CHEW Chew 1,200 mg by mouth daily with breakfast. Take in combination with vitamin D and magnesium. 08/26/17 02/22/18  Delano Metz, MD  diclofenac sodium (VOLTAREN) 1 % GEL Apply topically 4 (four) times daily.    [provider]  furosemide (LASIX) 20 MG tablet Take 20 mg by mouth daily.    [provider]  HYDROcodone-acetaminophen (NORCO/VICODIN) 5-325 MG tablet Take 1 tablet by mouth 2 (two) times daily as needed for severe pain. 09/30/17 10/30/17  Delano Metz, MD  Insulin Degludec (TRESIBA [place]) Inject 90 Units into the skin daily.    [provider]  insulin lispro (HUMALOG) 100 UNIT/ML injection Inject 12 Units into the skin 3 (three) times daily before meals.    [provider]   levothyroxine (SYNTHROID, LEVOTHROID) 112 MCG tablet Take 125 mcg by mouth daily before breakfast. Reported on 11/18/2015    [provider]  lisinopril (PRINIVIL,ZESTRIL) 20 MG tablet Take 20 mg by mouth daily. Reported on 09/23/2015    [provider]  omeprazole (PRILOSEC) 40 MG capsule Take 40 mg by mouth daily. 09/14/16 09/30/17  [provider]      VITAL SIGNS:  Blood pressure (!) 172/128, pulse 73, temperature 97.6 F (36.4 C), temperature source Oral, resp. rate 18, height 5\' 2"  (1.575 m), weight 102.5 kg (226 lb), SpO2 100 %.  PHYSICAL EXAMINATION:  GENERAL:  67 y.o.-year-old patient lying in the bed with no acute distress.  EYES: Pupils equal, round, reactive to light and accommodation. No scleral icterus.  Extraocular muscles intact.  HEENT: Head atraumatic, normocephalic. Oropharynx and nasopharynx clear.  NECK:  Supple, no jugular venous distention. No thyroid enlargement, no tenderness.  LUNGS: Normal breath sounds bilaterally, no wheezing, rales,rhonchi or crepitation. No use of accessory muscles of respiration.  CARDIOVASCULAR: S1, S2 normal. No murmurs, rubs, or gallops.  ABDOMEN: Soft, nontender, nondistended. Bowel sounds present. No organomegaly or mass.  EXTREMITIES: No pedal edema, cyanosis, or clubbing.  NEUROLOGIC: Cranial nerves II through XII are intact. Muscle strength 5/5 in all extremities. Sensation intact. Gait not checked.  PSYCHIATRIC: The patient is alert and oriented x 3.  SKIN: Vitiligo on skin hands feet and throughout the body  LABORATORY PANEL:   CBC Recent Labs  Lab 10/17/17 1507  WBC 4.8  HGB 15.2  HCT 45.2  PLT 319   ------------------------------------------------------------------------------------------------------------------  Chemistries  Recent Labs  Lab 10/17/17 1507  NA 139  K 5.5*  CL 110  CO2 19*  GLUCOSE 78  BUN 29*  CREATININE 1.09*  CALCIUM 9.0  AST 28  ALT 32  ALKPHOS 61  BILITOT 0.6    ------------------------------------------------------------------------------------------------------------------   IMPRESSION AND PLAN:   1.  Repeated hypoglycemia with history of diabetes on insulin.  Hold insulin at this time.  Start D10 drip.  Monitor sugars every 2 hours.  Add on a hemoglobin A1c.  Diabetes coordinator to evaluate tomorrow morning.  Likely will not need as much insulin at home. 2.  Accelerated hypertension on last blood pressure reading.  Give Norvasc 5 mg daily.  Prior blood pressure readings were better.  Hold on lisinopril with the patient's hyperkalemia 3.  Hyperkalemia.  IV fluid hydration.  Hold lisinopril.  Check potassium again tomorrow morning.  Monitor on telemetry. 4.  Chest pain improved with sugars improving.  Serial cardiac enzymes monitor on telemetry 5.  Hypothyroidism unspecified on levothyroxine 6.  Hyperlipidemia unspecified on atorvastatin 7.  History of fibromyalgia 8.  Weakness Physical therapy evaluation  All the records are reviewed and case discussed with ED provider. Management plans discussed with the patient, family and they are in agreement.  CODE STATUS: Full code  TOTAL TIME TAKING CARE OF THIS PATIENT: 50 minutes.    Alford Highland M.D on 10/17/2017 at 7:54 PM  Between 7am to 6pm - Pager - [phone]  After 6pm call admission pager [phone]  [facility] Office  [phone]  CC: Primary care physician; Titus Mould, NP

## 2017-10-17 NOTE — ED Notes (Signed)
Fleet Contras , RN aware of bed assigned
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
## 2017-10-18 ENCOUNTER — Other Ambulatory Visit: Payer: Self-pay

## 2017-10-18 DIAGNOSIS — E11649 Type 2 diabetes mellitus with hypoglycemia without coma: Secondary | ICD-10-CM | POA: Diagnosis not present

## 2017-10-18 DIAGNOSIS — E162 Hypoglycemia, unspecified: Secondary | ICD-10-CM | POA: Diagnosis not present

## 2017-10-18 LAB — GLUCOSE, CAPILLARY
GLUCOSE-CAPILLARY: 119 mg/dL — AB (ref 65–99)
GLUCOSE-CAPILLARY: 129 mg/dL — AB (ref 65–99)
GLUCOSE-CAPILLARY: 14 mg/dL — AB (ref 65–99)
GLUCOSE-CAPILLARY: 156 mg/dL — AB (ref 65–99)
GLUCOSE-CAPILLARY: 160 mg/dL — AB (ref 65–99)
GLUCOSE-CAPILLARY: 224 mg/dL — AB (ref 65–99)
Glucose-Capillary: 140 mg/dL — ABNORMAL HIGH (ref 65–99)
Glucose-Capillary: 146 mg/dL — ABNORMAL HIGH (ref 65–99)
Glucose-Capillary: 167 mg/dL — ABNORMAL HIGH (ref 65–99)
Glucose-Capillary: 222 mg/dL — ABNORMAL HIGH (ref 65–99)
Glucose-Capillary: 324 mg/dL — ABNORMAL HIGH (ref 65–99)
Glucose-Capillary: 265 mg/dL — ABNORMAL HIGH (ref 65–99)

## 2017-10-18 LAB — BASIC METABOLIC PANEL
Anion gap: 10 (ref 5–15)
BUN: 26 mg/dL — AB (ref 6–20)
CALCIUM: 8.6 mg/dL — AB (ref 8.9–10.3)
CHLORIDE: 108 mmol/L (ref 101–111)
CO2: 20 mmol/L — AB (ref 22–32)
Creatinine, Ser: 1.21 mg/dL — ABNORMAL HIGH (ref 0.44–1.00)
GFR calc non Af Amer: 46 mL/min — ABNORMAL LOW (ref >60)
GFR, EST AFRICAN AMERICAN: 53 mL/min — AB (ref >60)
Glucose, Bld: 229 mg/dL — ABNORMAL HIGH (ref 65–99)
Potassium: 4 mmol/L (ref 3.5–5.1)
SODIUM: 138 mmol/L (ref 135–145)

## 2017-10-18 LAB — HEMOGLOBIN A1C
Hgb A1c MFr Bld: 7.5 % — ABNORMAL HIGH (ref 4.8–5.6)
Mean Plasma Glucose: 168.55 mg/dL

## 2017-10-18 LAB — CBC
HCT: 38.6 % (ref 35.0–47.0)
Hemoglobin: 13.3 g/dL (ref 12.0–16.0)
MCH: 29.3 pg (ref 26.0–34.0)
MCHC: 34.4 g/dL (ref 32.0–36.0)
MCV: 85.2 fL (ref 80.0–100.0)
PLATELETS: 318 10*3/uL (ref 150–440)
RBC: 4.53 MIL/uL (ref 3.80–5.20)
RDW: 15.4 % — AB (ref 11.5–14.5)
WBC: 6.1 10*3/uL (ref 3.6–11.0)

## 2017-10-18 LAB — TROPONIN I
Troponin I: <0.03 ng/mL (ref <0.03)
Troponin I: <0.03 ng/mL (ref <0.03)

## 2017-10-18 MED ORDER — OXYCODONE HCL 5 MG PO TABS
5.0000 mg | ORAL_TABLET | Freq: Four times a day (QID) | ORAL | Status: DC | PRN
  Administered 2017-10-18: 5 mg via ORAL

## 2017-10-18 MED ORDER — HYDROCODONE-ACETAMINOPHEN 5-325 MG PO TABS
1.0000 | ORAL_TABLET | Freq: Two times a day (BID) | ORAL | Status: DC | PRN
  Administered 2017-10-18: 1 via ORAL
  Filled 2017-10-18: qty 1

## 2017-10-18 MED ORDER — DEXTROSE 5 % IV SOLN
INTRAVENOUS | Status: DC
  Administered 2017-10-18: 04:00:00 via INTRAVENOUS

## 2017-10-18 NOTE — Progress Notes (Signed)
[facility] - [place] at [facility]   PATIENT NAME: Carrie Gardner    MR#:  [id number]  DATE OF BIRTH:  07/07/1950  SUBJECTIVE:  Patient seen and evaluated today No confusion, dizziness No chest pain No shortness of breath   REVIEW OF SYSTEMS:    ROS  CONSTITUTIONAL: No documented fever. No fatigue, weakness. No weight gain, no weight loss.  EYES: No blurry or double vision.  ENT: No tinnitus. No postnasal drip. No redness of the oropharynx.  RESPIRATORY: No cough, no wheeze, no hemoptysis. No dyspnea.  CARDIOVASCULAR: No chest pain. No orthopnea. No palpitations. No syncope.  GASTROINTESTINAL: No nausea, no vomiting or diarrhea. No abdominal pain. No melena or hematochezia.  GENITOURINARY: No dysuria or hematuria.  ENDOCRINE: No polyuria or nocturia. No heat or cold intolerance.  HEMATOLOGY: No anemia. No bruising. No bleeding.  INTEGUMENTARY: No rashes. No lesions.  MUSCULOSKELETAL: No arthritis. No swelling. No gout.  NEUROLOGIC: No numbness, tingling, or ataxia. No seizure-type activity.  PSYCHIATRIC: No anxiety. No insomnia. No ADD.   DRUG ALLERGIES:   Allergies  Allergen Reactions  . Metformin Anaphylaxis  . Metformin And Related Anaphylaxis  . Pregabalin Anaphylaxis  . Ibuprofen Other (See Comments)    Other reaction(s): Other (See Comments) Kidney disease Kidney issues Kidney disease  . Iodinated Diagnostic Agents Other (See Comments)    Other reaction(s): Other (See Comments) Patient reports "I can't have dye r/t my kidney problems." Kidney disease  . Lisinopril Other (See Comments)    Low back pain  . Mobic [Meloxicam] Other (See Comments)    Causes stomach cramps.  Patient states she is unable to tolerate.   . Oxycodone Rash  . Oxycodone-Acetaminophen Rash  . Tramadol Other (See Comments) and Itching    Hallucinations hallucinations    VITALS:  Blood pressure (!) 156/81, pulse 75, temperature 98.5 F (36.9 C), temperature source  Oral, resp. rate 20, height 5\' 2"  (1.575 m), weight 101.3 kg (223 lb 5.2 oz), SpO2 99 %.  PHYSICAL EXAMINATION:   Physical Exam  GENERAL:  67 y.o.-year-old patient lying in the bed with no acute distress.  EYES: Pupils equal, round, reactive to light and accommodation. No scleral icterus. Extraocular muscles intact.  HEENT: Head atraumatic, normocephalic. Oropharynx and nasopharynx clear.  NECK:  Supple, no jugular venous distention. No thyroid enlargement, no tenderness.  LUNGS: Normal breath sounds bilaterally, no wheezing, rales, rhonchi. No use of accessory muscles of respiration.  CARDIOVASCULAR: S1, S2 normal. No murmurs, rubs, or gallops.  ABDOMEN: Soft, nontender, nondistended. Bowel sounds present. No organomegaly or mass.  EXTREMITIES: No cyanosis, clubbing or edema b/l.    NEUROLOGIC: Cranial nerves II through XII are intact. No focal Motor or sensory deficits b/l.   PSYCHIATRIC: The patient is alert and oriented x 3.  SKIN: No obvious rash, lesion, or ulcer.   LABORATORY PANEL:   CBC Recent Labs  Lab 10/18/17 0415  WBC 6.1  HGB 13.3  HCT 38.6  PLT 318   ------------------------------------------------------------------------------------------------------------------ Chemistries  Recent Labs  Lab 10/17/17 1507 10/18/17 0415  NA 139 138  K 5.5* 4.0  CL 110 108  CO2 19* 20*  GLUCOSE 78 229*  BUN 29* 26*  CREATININE 1.09* 1.21*  CALCIUM 9.0 8.6*  AST 28  --   ALT 32  --   ALKPHOS 61  --   BILITOT 0.6  --    ------------------------------------------------------------------------------------------------------------------  Cardiac Enzymes Recent Labs  Lab 10/18/17 0415  TROPONINI <0.03   ------------------------------------------------------------------------------------------------------------------  RADIOLOGY:  No results found.   ASSESSMENT AND PLAN:   67 year old female patient with history of diabetes mellitus on tresiba insulin and Humalog  insulin was admitted under the hospitalist service for low blood sugars.  Her blood sugar in the emergency room was 15.  She also had elevated blood pressure the time of presentation.  -Hypoglycemia Continue monitoring blood sugars closely Discontinue IV D10 drip and monitor blood sugars Insulin on hold for now Diabetic coordinator assessment  -Accelerated hypertension Blood pressure better controlled after admission  continue Norvasc 5 mg daily  -Hyperkalemia resolved  -Hyperlipidemia continue statin medication  -Hypothyroidism Continue levothyroxine  -Discussed with patient's family medical condition treatment plan  All the records are reviewed and case discussed with Care Management/Social Worker. Management plans discussed with the patient, family and they are in agreement.  CODE STATUS: Full code  DVT Prophylaxis: SCDs  TOTAL TIME TAKING CARE OF THIS PATIENT: 35 minutes.   POSSIBLE D/C IN 1 to 2 DAYS, DEPENDING ON CLINICAL CONDITION.  Ihor Austin M.D on 10/18/2017 at 12:44 PM  Between 7am to 6pm - Pager - [phone]  After 6pm go to [email or web address] - password [email or web address] [facility]  [facility] [place] [facility]  Office  [phone]  CC: Primary care physician; White, Arlyss Repress, NP  Note: This dictation was prepared with Dragon dictation along with smaller phrase technology. Any transcriptional errors that result from this process are unintentional.

## 2017-10-18 NOTE — Progress Notes (Signed)
Inpatient Diabetes Program Recommendations  AACE/ADA: New Consensus Statement on Inpatient Glycemic Control (2015)  Target Ranges:  Prepandial:   less than 140 mg/dL      Peak postprandial:   less than 180 mg/dL (1-2 hours)      Critically ill patients:  140 - 180 mg/dL    Admit: Hypoglycemia  History: DM, CKD, COPD  Home DM Meds: Tresiba 74 units QHS (recently reduced from 94 units)                             Humalog 12 units TID  Current Orders: None- Checking CBGs Q2 hours     Endocrinologist: Dr. Lucilla Lame with [facility]- Last seen 10/01/2017.  Tyler Aas was reduced to 74 units QHS at that visit.     Called Dr. Joycie Peek office today to discuss pt's admission and blood sugars.  Dr. Gabriel Carina recommended that we reduce pt's home dose of Tresiba to 30 units QHS and have patient hold her Humalog insulin until she can see Dr. Gabriel Carina next week.  Dr. Joycie Peek office to call pt after discharge to schedule an appt for next week (06/03- 06/07).   MD- At time of discharge please consider Dr. Joycie Peek above recommendations:  1. Reduce Tresiba to 30 units QHS  2. Hold home Humalog until follow up with Dr. Gabriel Carina  3. May want to go ahead and start Lantus 30 units QHS tonight to see how pt responds to this dose    Addendum: Spoke w/ pt earlier today.  Pt told me she took her Tresiba 74 units at bedtime on Wednesday night (05/29).  Had issues with Hypoglycemia at home and brought to ED.  Reviewed signs and symptoms of Hypoglycemia with pt. Pt stated she still has symptoms but sometimes becomes unresponsive and her family has to call EMS.  Advised family member in room to never force PO fluids like juice if patient is passed out or unable to take PO fluids safely.  Pt has glucose tablets and glucagon kit at home.  Patient and family stated familiarity with glucagon pen.  Told patient I would call Dr. Joycie Peek office to discuss her case and then let the Hospitalist MD know what Dr. Gabriel Carina  was recommending.     --Will follow patient during hospitalization--  Wyn Quaker RN, MSN, CDE Diabetes Coordinator Inpatient Glycemic Control Team Team Pager: [phone] (8a-5p)

## 2017-10-18 NOTE — Evaluation (Signed)
Physical Therapy Evaluation Patient Details Name: Carrie Gardner MRN: [id number] DOB: 1950/08/13 Today's Date: 10/18/2017   History of Present Illness  presented to ER and admitted for management of hypoglycemia.  Most recent FSBS 129.  Clinical Impression  Upon evaluation, patient alert and oriented to basic information; follows all commands and demonstrates good insight/safety awareness.  Bilat UE/LE strength and ROM grossly symmetrical and WFL for basic transfers and gait; no focal weakness appreciated.  Does endorse baseline neuropathy and sciatica (R LE), but does not appear to impact functional abilities this date.  Able to complete bed mobility with mod indep; sit/stand, basic transfers and gait (160') with RW, sup/mod indep; gait (75') with SPC, close sup. Mild decrease in fluidity and overall cadence/gait speed with use of SPC, but patient prefers to continue use of SPC (baseline for her). Appears to be at baseline level of functional ability, sup/mod indep with SPC and/or RW; no acute PT needs identified. Did encourage progressive mobility throughout unit (with staff assist) during remaining hospitalization to maintain strength/endurance necessary for safe discharge home when medically appropriate.  Patient voiced understanding.    Follow Up Recommendations No PT follow up    Equipment Recommendations  (has all necessary equipment)    Recommendations for Other Services       Precautions / Restrictions Precautions Precautions: Fall;ICD/Pacemaker Restrictions Weight Bearing Restrictions: No      Mobility  Bed Mobility Overal bed mobility: Modified Independent                Transfers Overall transfer level: Needs assistance   Transfers: Sit to/from Stand Sit to Stand: Supervision         General transfer comment: with and without assist device, no change in performance, level of assist or safety   Ambulation/Gait Ambulation/Gait assistance: Min  guard;Supervision Ambulation Distance (Feet): 160 Feet Assistive device: Rolling walker (2 wheeled)       General Gait Details: reciprocal stepping pattern; slow, but steady, cadence without buckling or LOB  Stairs            Wheelchair Mobility    Modified Rankin (Stroke Patients Only)       Balance Overall balance assessment: Needs assistance Sitting-balance support: No upper extremity supported;Feet supported Sitting balance-Leahy Scale: Good     Standing balance support: Bilateral upper extremity supported;Single extremity supported Standing balance-Leahy Scale: Fair                               Pertinent Vitals/Pain Pain Assessment: Faces Faces Pain Scale: No hurt    Home Living Family/patient expects to be discharged to:: Private residence Living Arrangements: Alone Available Help at Discharge: Family;Available PRN/intermittently Type of Home: Apartment Home Access: Stairs to enter Entrance Stairs-Rails: Left Entrance Stairs-Number of Steps: 8 Home Layout: One level Home Equipment: Walker - 2 wheels;Walker - 4 wheels;Cane - quad;Cane - single point;Toilet riser;Shower seat      Prior Function Level of Independence: Independent with assistive device(s)         Comments: Mod indep with SPC for ADLs, household and limited community mobilization; drives "when I have to".  Daughter provides intermittent check in and assist with community needs/errands as needed.  Does endorse 1-2 falls within previous six months, most recent approx 3 weeks prior to admission.     Hand Dominance   Dominant Hand: Right    Extremity/Trunk Assessment   Upper Extremity Assessment Upper Extremity Assessment: Overall  WFL for tasks assessed(grossly 4+/5 throughout)    Lower Extremity Assessment Lower Extremity Assessment: Overall WFL for tasks assessed(grossly 4+/5 throughout; reports mild baseline weakness to R LE due to sciatica)       Communication    Communication: No difficulties  Cognition Arousal/Alertness: Awake/alert Behavior During Therapy: WFL for tasks assessed/performed Overall Cognitive Status: Within Functional Limits for tasks assessed                                        General Comments      Exercises Other Exercises Other Exercises: 24' with SPC, close sup-mild decrease in truncal rotation, arm swing, overall fluidity and gait speed; however, patient prefers use of SPC (baseline for her).  No buckling or LOB; fair/good awraeness of safety needs and balance considerations; indep use of compensatory strategies as needed.   Assessment/Plan    PT Assessment Patent does not need any further PT services  PT Problem List         PT Treatment Interventions      PT Goals (Current goals can be found in the Care Plan section)  Acute Rehab PT Goals Patient Stated Goal: to get back home PT Goal Formulation: All assessment and education complete, DC therapy Time For Goal Achievement: 10/18/17 Potential to Achieve Goals: Good    Frequency     Barriers to discharge        Co-evaluation               AM-PAC PT "6 Clicks" Daily Activity  Outcome Measure Difficulty turning over in bed (including adjusting bedclothes, sheets and blankets)?: None Difficulty moving from lying on back to sitting on the side of the bed? : None Difficulty sitting down on and standing up from a chair with arms (e.g., wheelchair, bedside commode, etc,.)?: None Help needed moving to and from a bed to chair (including a wheelchair)?: None Help needed walking in hospital room?: None Help needed climbing 3-5 steps with a railing? : A Little 6 Click Score: 23    End of Session Equipment Utilized During Treatment: Gait belt Activity Tolerance: Patient tolerated treatment well Patient left: in bed;with bed alarm set;with call bell/phone within reach;with family/visitor present Nurse Communication: Mobility status PT Visit  Diagnosis: Difficulty in walking, not elsewhere classified (R26.2);Muscle weakness (generalized) (M62.81)    Time: 4098-1191 PT Time Calculation (min) (ACUTE ONLY): 15 min   Charges:   PT Evaluation $PT Eval Low Complexity: 1 Low PT Treatments $Gait Training: 8-22 mins   PT G Codes:        Hawley Pavia H. Manson Passey, PT, DPT, NCS 10/18/17, 9:52 AM [phone]

## 2017-10-18 NOTE — Progress Notes (Signed)
Inpatient Diabetes Program Recommendations  AACE/ADA: New Consensus Statement on Inpatient Glycemic Control (2015)  Target Ranges:  Prepandial:   less than 140 mg/dL      Peak postprandial:   less than 180 mg/dL (1-2 hours)      Critically ill patients:  140 - 180 mg/dL   Results for NACOLE, FLUHR (MRN [id number]) as of 10/18/2017 08:29  Ref. Range 10/17/2017 15:00 10/17/2017 18:10 10/17/2017 18:21 10/17/2017 18:57 10/17/2017 19:19 10/17/2017 20:36 10/17/2017 22:38  Glucose-Capillary Latest Ref Range: 65 - 99 mg/dL 89 15 (LL) 14 (LL) 57 (L) 152 (H) 201 (H) 138 (H)   Results for HERMA, UBALLE (MRN [id number]) as of 10/18/2017 08:29  Ref. Range 10/18/2017 00:02 10/18/2017 02:08 10/18/2017 04:14 10/18/2017 06:11 10/18/2017 07:40  Glucose-Capillary Latest Ref Range: 65 - 99 mg/dL 643 (H) 329 (H) 518 (H) 119 (H) 129 (H)   Results for KETRINA, BOATENG (MRN [id number]) as of 10/18/2017 08:29  Ref. Range 10/17/2017 19:32  Hemoglobin A1C Latest Ref Range: 4.8 - 5.6 % 7.5 (H)  Average glucose 168 mg/dl    Admit: Hypoglycemia  History: DM, CKD, COPD  Home DM Meds: Tresiba 74 units QHS (recently reduced from 94 units)       Humalog 12 units TID  Current Orders: None- Checking CBGs Q2 hours     Endocrinologist: Dr. Carlena Sax with [facility]- Last seen 10/01/2017.  Evaristo Bury was reduced to 74 units QHS at that visit.    Plan to speak with patient today.  Likely needs further reduction of insulin for home.  Current long-acting insulin Evaristo Bury) dose is ~0.75 units/kg.  If patient has been having difficulty with frequent Hypoglycemic episodes, may likely need reduction of long-acting insulin.  At time of discharge, may want to reduce Tresiba dose to 40 units QHS (0.4 units/kg dosing based on weight of 101 kg)  Have patient follow up with Dr. Tedd Sias for further monitoring and insulin adjustment.      --Will follow patient during hospitalization--  Ambrose Finland RN, MSN, CDE Diabetes  Coordinator Inpatient Glycemic Control Team Team Pager: [phone] (8a-5p)

## 2017-10-18 NOTE — Progress Notes (Signed)
Advanced care plan.  Purpose of the Encounter: CODE STATUS  Parties in Attendance:Patient Patient's Decision Capacity:Good Subjective/Patient's story: Presented with low blood sugar and elevated blood pressure  Objective/Medical story Has hypoglycemia and uncontrolled hypertension Goals of care determination:  Advanced directives discussed with the patient  goals of care discussed patient wants everything done which includes cardiac resuscitation, intubation and ventilator if the need arises CODE STATUS: Full code Time spent discussing advanced care planning: 16 minutes

## 2017-10-18 NOTE — Care Management Note (Signed)
Case Management Note  Patient Details  Name: Carrie Gardner MRN: [id number] Date of Birth: 12/27/1950  Subjective/Objective:                  Admitted to [facility] with the diagnosis of hypoglycemia. Lives alone. Daughter is Renly Guedes [phone]). Last seen Doristine Mango NP on Thursday. Prescriptions are filled at [facility] in [facility]. Home Health per [facility] about a month ago. They are no longer in the home. No skilled nursing. No home oxygen. Raised toilet seat, shower chair, rolayor, rolling walker and cane in the home. Takes care of all basic activities of daily living herself, drives, if needed. Last fall was 3 weeks ago. Good-fair appetite. Family will transport States she has all her diabetic supplies  Action/Plan: Received referral for medication assistance. States she is having no trouble getting her medications. Drug plan per [facility]  Expected Discharge Date:                  Expected Discharge Plan:     In-House Referral:     Discharge planning Services     Post Acute Care Choice:    Choice offered to:     DME Arranged:    DME Agency:     HH Arranged:    HH Agency:     Status of Service:     If discussed at [employer], dates discussed:    Additional Comments:  Gwenette Greet, RN MSN CCM Care Management [phone] 10/18/2017, 8:44 AM

## 2017-10-19 DIAGNOSIS — E1142 Type 2 diabetes mellitus with diabetic polyneuropathy: Secondary | ICD-10-CM | POA: Diagnosis present

## 2017-10-19 DIAGNOSIS — Z8673 Personal history of transient ischemic attack (TIA), and cerebral infarction without residual deficits: Secondary | ICD-10-CM | POA: Diagnosis not present

## 2017-10-19 DIAGNOSIS — E039 Hypothyroidism, unspecified: Secondary | ICD-10-CM | POA: Diagnosis present

## 2017-10-19 DIAGNOSIS — Z823 Family history of stroke: Secondary | ICD-10-CM | POA: Diagnosis not present

## 2017-10-19 DIAGNOSIS — Z7982 Long term (current) use of aspirin: Secondary | ICD-10-CM | POA: Diagnosis not present

## 2017-10-19 DIAGNOSIS — I10 Essential (primary) hypertension: Secondary | ICD-10-CM | POA: Diagnosis present

## 2017-10-19 DIAGNOSIS — J449 Chronic obstructive pulmonary disease, unspecified: Secondary | ICD-10-CM | POA: Diagnosis present

## 2017-10-19 DIAGNOSIS — E785 Hyperlipidemia, unspecified: Secondary | ICD-10-CM | POA: Diagnosis present

## 2017-10-19 DIAGNOSIS — Z79899 Other long term (current) drug therapy: Secondary | ICD-10-CM | POA: Diagnosis not present

## 2017-10-19 DIAGNOSIS — Z79890 Hormone replacement therapy (postmenopausal): Secondary | ICD-10-CM | POA: Diagnosis not present

## 2017-10-19 DIAGNOSIS — E11649 Type 2 diabetes mellitus with hypoglycemia without coma: Secondary | ICD-10-CM | POA: Diagnosis present

## 2017-10-19 DIAGNOSIS — Z90710 Acquired absence of both cervix and uterus: Secondary | ICD-10-CM | POA: Diagnosis not present

## 2017-10-19 DIAGNOSIS — F411 Generalized anxiety disorder: Secondary | ICD-10-CM | POA: Diagnosis present

## 2017-10-19 DIAGNOSIS — E875 Hyperkalemia: Secondary | ICD-10-CM | POA: Diagnosis present

## 2017-10-19 DIAGNOSIS — Z833 Family history of diabetes mellitus: Secondary | ICD-10-CM | POA: Diagnosis not present

## 2017-10-19 DIAGNOSIS — Z794 Long term (current) use of insulin: Secondary | ICD-10-CM | POA: Diagnosis not present

## 2017-10-19 DIAGNOSIS — E162 Hypoglycemia, unspecified: Secondary | ICD-10-CM | POA: Diagnosis present

## 2017-10-19 DIAGNOSIS — M797 Fibromyalgia: Secondary | ICD-10-CM | POA: Diagnosis present

## 2017-10-19 DIAGNOSIS — Z818 Family history of other mental and behavioral disorders: Secondary | ICD-10-CM | POA: Diagnosis not present

## 2017-10-19 LAB — BASIC METABOLIC PANEL
ANION GAP: 5 (ref 5–15)
BUN: 27 mg/dL — AB (ref 6–20)
CO2: 22 mmol/L (ref 22–32)
Calcium: 8.5 mg/dL — ABNORMAL LOW (ref 8.9–10.3)
Chloride: 110 mmol/L (ref 101–111)
Creatinine, Ser: 1.12 mg/dL — ABNORMAL HIGH (ref 0.44–1.00)
GFR, EST AFRICAN AMERICAN: 58 mL/min — AB (ref >60)
GFR, EST NON AFRICAN AMERICAN: 50 mL/min — AB (ref >60)
Glucose, Bld: 236 mg/dL — ABNORMAL HIGH (ref 65–99)
POTASSIUM: 4.4 mmol/L (ref 3.5–5.1)
SODIUM: 137 mmol/L (ref 135–145)

## 2017-10-19 LAB — GLUCOSE, CAPILLARY
GLUCOSE-CAPILLARY: 175 mg/dL — AB (ref 65–99)
GLUCOSE-CAPILLARY: 149 mg/dL — AB (ref 65–99)
Glucose-Capillary: 184 mg/dL — ABNORMAL HIGH (ref 65–99)
Glucose-Capillary: 230 mg/dL — ABNORMAL HIGH (ref 65–99)
Glucose-Capillary: 249 mg/dL — ABNORMAL HIGH (ref 65–99)
Glucose-Capillary: 284 mg/dL — ABNORMAL HIGH (ref 65–99)

## 2017-10-19 MED ORDER — INSULIN DEGLUDEC 100 UNIT/ML ~~LOC~~ SOLN: 30.0000 [IU] | Freq: Every day | SUBCUTANEOUS | 0 refills | Status: DC

## 2017-10-19 MED ORDER — INSULIN ASPART 100 UNIT/ML ~~LOC~~ SOLN: 0.0000 [IU] | Freq: Every day | SUBCUTANEOUS | Status: DC

## 2017-10-19 MED ORDER — INSULIN ASPART 100 UNIT/ML ~~LOC~~ SOLN
0.0000 [IU] | Freq: Three times a day (TID) | SUBCUTANEOUS | Status: DC
  Administered 2017-10-19: 12:00:00 1 [IU] via SUBCUTANEOUS
  Administered 2017-10-19: 08:00:00 2 [IU] via SUBCUTANEOUS
  Filled 2017-10-19 (×2): qty 1

## 2017-10-19 NOTE — Discharge Summary (Signed)
[facility] [facility] [place] NAME: Carrie Gardner    MR#:  [id number]  DATE OF BIRTH:  Sep 05, 1950  DATE OF ADMISSION:  10/17/2017   ADMITTING PHYSICIAN: Loletha Grayer, MD  DATE OF DISCHARGE: 10/19/17  PRIMARY CARE PHYSICIAN: Ricardo Jericho, NP   ADMISSION DIAGNOSIS:   Hypoglycemia [E16.2]  DISCHARGE DIAGNOSIS:   Active Problems:   Hypoglycemia   SECONDARY DIAGNOSIS:   Past Medical History:  Diagnosis Date  . Allergy   . Arthritis   . Bradycardia   . Cataract   . Chronic kidney disease   . COPD (chronic obstructive pulmonary disease) ([place])   . Diabetes mellitus without complication ([place])   . Fibromyalgia   . Generalized anxiety disorder 02/09/2009  . Hyperlipidemia 10/20/2006  . Hypertension   . Hypothyroidism   . Opiate use 06/03/2015  . Presence of permanent cardiac pacemaker   . Sciatica   . Seizures ([place])   . Sleep apnea   . Thyroid disease   . TIA (transient ischemic attack)   . Vitiligo     HOSPITAL COURSE:   67 year old female with past medical history significant for insulin-dependent diabetes mellitus, CKD, fibromyalgia, anxiety, COPD and arthritis presents to hospital secondary to weakness and noted to have hypoglycemia  1.  Hypoglycemia- on Tresciba at home, doses were recently decreased due to low sugars.  Never had sugars this low before. -Started on D10 drip.  Her Tyler Aas was on hold. -Appreciate diabetes coordinator input who was able to get in touch with patient's endocrinologist.  Patient will be discharged on Tresiba 30 units at bedtime and off Humalog for now.  She does have glucose tablets and know how to use her glucagon kit if needed. -Advised to follow-up with her endocrinologist in the next 2 to 3 days after discharge. -Patient has been asymptomatic and feels ready for discharge  2.  Hypertension-patient on metoprolol at home, will continue that  3.  Hypothyroidism-continue Synthroid  4.   Hypokalemia-resolved  Patient has been ambulatory  DISCHARGE CONDITIONS:   Guarded  CONSULTS OBTAINED:   None  DRUG ALLERGIES:   Allergies  Allergen Reactions  . Metformin Anaphylaxis  . Metformin And Related Anaphylaxis  . Pregabalin Anaphylaxis  . Ibuprofen Other (See Comments)    Other reaction(s): Other (See Comments) Kidney disease Kidney issues Kidney disease  . Iodinated Diagnostic Agents Other (See Comments)    Other reaction(s): Other (See Comments) Patient reports "I can't have dye r/t my kidney problems." Kidney disease  . Lisinopril Other (See Comments)    Low back pain  . Mobic [Meloxicam] Other (See Comments)    Causes stomach cramps.  Patient states she is unable to tolerate.   . Oxycodone Rash  . Oxycodone-Acetaminophen Rash  . Tramadol Other (See Comments) and Itching    Hallucinations hallucinations   DISCHARGE MEDICATIONS:   Allergies as of 10/19/2017      Reactions   Metformin Anaphylaxis   Metformin And Related Anaphylaxis   Pregabalin Anaphylaxis   Ibuprofen Other (See Comments)   Other reaction(s): Other (See Comments) Kidney disease Kidney issues Kidney disease   Iodinated Diagnostic Agents Other (See Comments)   Other reaction(s): Other (See Comments) Patient reports "I can't have dye r/t my kidney problems." Kidney disease   Lisinopril Other (See Comments)   Low back pain   Mobic [meloxicam] Other (See Comments)   Causes stomach cramps.  Patient states she is unable to tolerate.    Oxycodone  Rash   Oxycodone-acetaminophen Rash   Tramadol Other (See Comments), Itching   Hallucinations hallucinations      Medication List    STOP taking these medications   insulin lispro 100 UNIT/ML injection Commonly known as:  HUMALOG     TAKE these medications   aspirin 81 MG tablet Take 81 mg by mouth daily.   furosemide 20 MG tablet Commonly known as:  LASIX Take 20 mg by mouth daily.   glucagon 1 MG Solr injection Commonly  known as:  GLUCAGEN Inject 1 mg into the vein once as needed for low blood sugar.   GNP CALCIUM 1200 1200-1000 MG-UNIT Chew Chew 1,200 mg by mouth daily with breakfast. Take in combination with vitamin D and magnesium.   HYDROcodone-acetaminophen 5-325 MG tablet Commonly known as:  NORCO/VICODIN Take 1 tablet by mouth 2 (two) times daily as needed for severe pain.   Insulin Degludec 100 UNIT/ML Soln Commonly known as:  TRESIBA Inject 30 Units into the skin at bedtime. What changed:  how much to take   levothyroxine 125 MCG tablet Commonly known as:  SYNTHROID, LEVOTHROID Take 125 mcg by mouth daily before breakfast.   LINZESS 145 MCG Caps capsule Generic drug:  linaclotide Take 145 mcg by mouth daily.   metoprolol tartrate 25 MG tablet Commonly known as:  LOPRESSOR Take 25 mg by mouth 2 (two) times daily.   nortriptyline 10 MG capsule Commonly known as:  PAMELOR Take 10-30 mg by mouth at bedtime.   omeprazole 40 MG capsule Commonly known as:  PRILOSEC Take 40 mg by mouth daily.   Vitamin D (Ergocalciferol) 50000 units Caps capsule Commonly known as:  DRISDOL Take 50,000 Units by mouth every Friday.        DISCHARGE INSTRUCTIONS:   1.  PCP follow-up in 1 to 2 weeks 2.  Endocrinology follow-up in 2 to 3 days  DIET:   Diabetic diet  ACTIVITY:   Activity as tolerated  OXYGEN:   Home Oxygen: No.  Oxygen Delivery: room air  DISCHARGE LOCATION:   home   If you experience worsening of your admission symptoms, develop shortness of breath, life threatening emergency, suicidal or homicidal thoughts you must seek medical attention immediately by calling 911 or calling your MD immediately  if symptoms less severe.  You Must read complete instructions/literature along with all the possible adverse reactions/side effects for all the Medicines you take and that have been prescribed to you. Take any new Medicines after you have completely understood and accpet all  the possible adverse reactions/side effects.   Please note  You were cared for by a hospitalist during your hospital stay. If you have any questions about your discharge medications or the care you received while you were in the hospital after you are discharged, you can call the unit and asked to speak with the hospitalist on call if the hospitalist that took care of you is not available. Once you are discharged, your primary care physician will handle any further medical issues. Please note that NO REFILLS for any discharge medications will be authorized once you are discharged, as it is imperative that you return to your primary care physician (or establish a relationship with a primary care physician if you do not have one) for your aftercare needs so that they can reassess your need for medications and monitor your lab values.    On the day of Discharge:  VITAL SIGNS:   Blood pressure 138/62, pulse 61, temperature 98.2 F (  36.8 C), temperature source Oral, resp. rate 20, height 5' 2"  (1.575 m), weight 101.3 kg (223 lb 5.2 oz), SpO2 99 %.  PHYSICAL EXAMINATION:    GENERAL:  67 y.o.-year-old patient lying in the bed with no acute distress.  EYES: Pupils equal, round, reactive to light and accommodation. No scleral icterus. Extraocular muscles intact.  HEENT: Head atraumatic, normocephalic. Oropharynx and nasopharynx clear.  NECK:  Supple, no jugular venous distention. No thyroid enlargement, no tenderness.  LUNGS: Normal breath sounds bilaterally, no wheezing, rales,rhonchi or crepitation. No use of accessory muscles of respiration.  CARDIOVASCULAR: S1, S2 normal. No murmurs, rubs, or gallops.  ABDOMEN: Soft, non-tender, non-distended. Bowel sounds present. No organomegaly or mass.  EXTREMITIES: No pedal edema, cyanosis, or clubbing.  NEUROLOGIC: Cranial nerves II through XII are intact. Muscle strength 5/5 in all extremities. Sensation intact. Gait not checked.  PSYCHIATRIC: The patient  is alert and oriented x 3.  SKIN: No obvious rash, lesion, or ulcer. vitiliago with hypo pogmentation noted around mouth and on hands  DATA REVIEW:   CBC Recent Labs  Lab 10/18/17 0415  WBC 6.1  HGB 13.3  HCT 38.6  PLT 318    Chemistries  Recent Labs  Lab 10/17/17 1507  10/19/17 0356  NA 139   < > 137  K 5.5*   < > 4.4  CL 110   < > 110  CO2 19*   < > 22  GLUCOSE 78   < > 236*  BUN 29*   < > 27*  CREATININE 1.09*   < > 1.12*  CALCIUM 9.0   < > 8.5*  AST 28  --   --   ALT 32  --   --   ALKPHOS 61  --   --   BILITOT 0.6  --   --    < > = values in this interval not displayed.     Microbiology Results  Results for orders placed or performed during the hospital encounter of 09/12/16  Urine culture     Status: Abnormal   Collection Time: 09/12/16 11:23 AM  Result Value Ref Range Status   Specimen Description URINE, CLEAN CATCH  Final   Special Requests NONE  Final   Culture MULTIPLE SPECIES PRESENT, SUGGEST RECOLLECTION (A)  Final   Report Status 09/13/2016 FINAL  Final    RADIOLOGY:  No results found.   Management plans discussed with the patient, family and they are in agreement.  CODE STATUS:     Code Status Orders  (From admission, onward)        Start     Ordered   10/17/17 2045  Full code  Continuous     10/17/17 2045    Code Status History    Date Active Date Inactive Code Status Order ID Comments User Context   11/08/2016 1020 11/08/2016 1440 Full Code [id number]  Isaias Cowman, MD Inpatient      TOTAL TIME TAKING CARE OF THIS PATIENT: 38 minutes.    Gladstone Lighter M.D on 10/19/2017 at 11:33 AM  Between 7am to 6pm - Pager - [phone]  After 6pm go to [email or web address] - [employer] [facility]  Office  [phone]  CC: Primary care physician; White, Orlene Och, NP   Note: This dictation was prepared with Dragon dictation along with smaller phrase technology. Any transcriptional  errors that result from this process are unintentional.

## 2017-10-19 NOTE — Progress Notes (Signed)
Pt being discharged home, discharge instructions reviewed with pt and daughter, states understanding, pt with no complaints 

## 2017-10-21 LAB — HIV ANTIBODY (ROUTINE TESTING W REFLEX): HIV Screen 4th Generation wRfx: NONREACTIVE

## 2017-10-24 ENCOUNTER — Other Ambulatory Visit: Payer: Self-pay | Admitting: Internal Medicine

## 2017-10-24 ENCOUNTER — Ambulatory Visit
Admission: RE | Admit: 2017-10-24 | Discharge: 2017-10-24 | Disposition: A | Discharge status: Home / Self Care | Payer: Medicare Other | Source: Ambulatory Visit | Attending: Internal Medicine | Admitting: Internal Medicine

## 2017-10-24 DIAGNOSIS — M7989 Other specified soft tissue disorders: Secondary | ICD-10-CM

## 2017-10-24 DIAGNOSIS — Z794 Long term (current) use of insulin: Secondary | ICD-10-CM | POA: Insufficient documentation

## 2017-10-24 DIAGNOSIS — M79602 Pain in left arm: Secondary | ICD-10-CM

## 2017-10-24 DIAGNOSIS — E11649 Type 2 diabetes mellitus with hypoglycemia without coma: Secondary | ICD-10-CM | POA: Diagnosis present
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
## 2017-10-30 ENCOUNTER — Encounter: Payer: Self-pay | Admitting: Pain Medicine

## 2017-10-30 ENCOUNTER — Other Ambulatory Visit: Payer: Self-pay

## 2017-10-30 ENCOUNTER — Ambulatory Visit: Payer: Medicare Other | Attending: Pain Medicine | Admitting: Pain Medicine

## 2017-10-30 VITALS — BP 161/85 | HR 113 | Temp 97.5°F | Ht 63.0 in | Wt 228.0 lb

## 2017-10-30 DIAGNOSIS — G894 Chronic pain syndrome: Secondary | ICD-10-CM | POA: Insufficient documentation

## 2017-10-30 DIAGNOSIS — I129 Hypertensive chronic kidney disease with stage 1 through stage 4 chronic kidney disease, or unspecified chronic kidney disease: Secondary | ICD-10-CM | POA: Diagnosis not present

## 2017-10-30 DIAGNOSIS — E1122 Type 2 diabetes mellitus with diabetic chronic kidney disease: Secondary | ICD-10-CM | POA: Insufficient documentation

## 2017-10-30 DIAGNOSIS — E11649 Type 2 diabetes mellitus with hypoglycemia without coma: Secondary | ICD-10-CM | POA: Diagnosis not present

## 2017-10-30 DIAGNOSIS — M5137 Other intervertebral disc degeneration, lumbosacral region: Secondary | ICD-10-CM

## 2017-10-30 DIAGNOSIS — M79605 Pain in left leg: Secondary | ICD-10-CM | POA: Diagnosis not present

## 2017-10-30 DIAGNOSIS — Z79891 Long term (current) use of opiate analgesic: Secondary | ICD-10-CM | POA: Insufficient documentation

## 2017-10-30 DIAGNOSIS — N189 Chronic kidney disease, unspecified: Secondary | ICD-10-CM | POA: Diagnosis not present

## 2017-10-30 DIAGNOSIS — M797 Fibromyalgia: Secondary | ICD-10-CM | POA: Diagnosis not present

## 2017-10-30 DIAGNOSIS — K210 Gastro-esophageal reflux disease with esophagitis: Secondary | ICD-10-CM | POA: Insufficient documentation

## 2017-10-30 DIAGNOSIS — M5030 Other cervical disc degeneration, unspecified cervical region: Secondary | ICD-10-CM | POA: Diagnosis not present

## 2017-10-30 DIAGNOSIS — G2581 Restless legs syndrome: Secondary | ICD-10-CM | POA: Diagnosis not present

## 2017-10-30 DIAGNOSIS — E1142 Type 2 diabetes mellitus with diabetic polyneuropathy: Secondary | ICD-10-CM | POA: Insufficient documentation

## 2017-10-30 DIAGNOSIS — M150 Primary generalized (osteo)arthritis: Secondary | ICD-10-CM

## 2017-10-30 DIAGNOSIS — M79601 Pain in right arm: Secondary | ICD-10-CM | POA: Diagnosis not present

## 2017-10-30 DIAGNOSIS — G8929 Other chronic pain: Secondary | ICD-10-CM

## 2017-10-30 DIAGNOSIS — M961 Postlaminectomy syndrome, not elsewhere classified: Secondary | ICD-10-CM | POA: Diagnosis not present

## 2017-10-30 DIAGNOSIS — M1990 Unspecified osteoarthritis, unspecified site: Secondary | ICD-10-CM | POA: Insufficient documentation

## 2017-10-30 DIAGNOSIS — Z79890 Hormone replacement therapy (postmenopausal): Secondary | ICD-10-CM | POA: Insufficient documentation

## 2017-10-30 DIAGNOSIS — Z6841 Body Mass Index (BMI) 40.0 and over, adult: Secondary | ICD-10-CM | POA: Insufficient documentation

## 2017-10-30 DIAGNOSIS — Z79899 Other long term (current) drug therapy: Secondary | ICD-10-CM | POA: Insufficient documentation

## 2017-10-30 DIAGNOSIS — M5116 Intervertebral disc disorders with radiculopathy, lumbar region: Secondary | ICD-10-CM | POA: Insufficient documentation

## 2017-10-30 DIAGNOSIS — M533 Sacrococcygeal disorders, not elsewhere classified: Secondary | ICD-10-CM | POA: Insufficient documentation

## 2017-10-30 DIAGNOSIS — Z950 Presence of cardiac pacemaker: Secondary | ICD-10-CM | POA: Insufficient documentation

## 2017-10-30 DIAGNOSIS — Z5181 Encounter for therapeutic drug level monitoring: Secondary | ICD-10-CM | POA: Insufficient documentation

## 2017-10-30 DIAGNOSIS — M5416 Radiculopathy, lumbar region: Secondary | ICD-10-CM | POA: Diagnosis not present

## 2017-10-30 DIAGNOSIS — Z9049 Acquired absence of other specified parts of digestive tract: Secondary | ICD-10-CM | POA: Insufficient documentation

## 2017-10-30 DIAGNOSIS — M159 Polyosteoarthritis, unspecified: Secondary | ICD-10-CM

## 2017-10-30 DIAGNOSIS — M79604 Pain in right leg: Secondary | ICD-10-CM | POA: Insufficient documentation

## 2017-10-30 DIAGNOSIS — I2510 Atherosclerotic heart disease of native coronary artery without angina pectoris: Secondary | ICD-10-CM | POA: Insufficient documentation

## 2017-10-30 DIAGNOSIS — E785 Hyperlipidemia, unspecified: Secondary | ICD-10-CM | POA: Insufficient documentation

## 2017-10-30 DIAGNOSIS — Z885 Allergy status to narcotic agent status: Secondary | ICD-10-CM | POA: Insufficient documentation

## 2017-10-30 DIAGNOSIS — J449 Chronic obstructive pulmonary disease, unspecified: Secondary | ICD-10-CM | POA: Insufficient documentation

## 2017-10-30 DIAGNOSIS — G4730 Sleep apnea, unspecified: Secondary | ICD-10-CM | POA: Insufficient documentation

## 2017-10-30 DIAGNOSIS — Z7982 Long term (current) use of aspirin: Secondary | ICD-10-CM | POA: Insufficient documentation

## 2017-10-30 DIAGNOSIS — M79602 Pain in left arm: Secondary | ICD-10-CM | POA: Diagnosis not present

## 2017-10-30 DIAGNOSIS — M47897 Other spondylosis, lumbosacral region: Secondary | ICD-10-CM | POA: Insufficient documentation

## 2017-10-30 DIAGNOSIS — F411 Generalized anxiety disorder: Secondary | ICD-10-CM | POA: Insufficient documentation

## 2017-10-30 DIAGNOSIS — E039 Hypothyroidism, unspecified: Secondary | ICD-10-CM | POA: Insufficient documentation

## 2017-10-30 DIAGNOSIS — E559 Vitamin D deficiency, unspecified: Secondary | ICD-10-CM | POA: Insufficient documentation

## 2017-10-30 DIAGNOSIS — Z8673 Personal history of transient ischemic attack (TIA), and cerebral infarction without residual deficits: Secondary | ICD-10-CM | POA: Insufficient documentation

## 2017-10-30 DIAGNOSIS — Z888 Allergy status to other drugs, medicaments and biological substances status: Secondary | ICD-10-CM | POA: Insufficient documentation

## 2017-10-30 DIAGNOSIS — Z886 Allergy status to analgesic agent status: Secondary | ICD-10-CM | POA: Insufficient documentation

## 2017-10-30 MED ORDER — DICLOFENAC SODIUM 1 % TD GEL: 4.0000 g | Freq: Four times a day (QID) | TRANSDERMAL | 99 refills | Status: DC

## 2017-10-30 MED ORDER — HYDROCODONE-ACETAMINOPHEN 5-325 MG PO TABS: 1.0000 | ORAL_TABLET | Freq: Two times a day (BID) | ORAL | 0 refills | Status: DC | PRN

## 2017-10-30 NOTE — Patient Instructions (Addendum)
____________________________________________________________________________________________  Preparing for Procedure with Sedation  Instructions: . Oral Intake: Do not eat or drink anything for at least 8 hours prior to your procedure. . Transportation: Public transportation is not allowed. Bring an adult driver. The driver must be physically present in our waiting room before any procedure can be started. . Physical Assistance: Bring an adult physically capable of assisting you, in the event you need help. This adult should keep you company at home for at least 6 hours after the procedure. . Blood Pressure Medicine: Take your blood pressure medicine with a sip of water the morning of the procedure. . Blood thinners:  . Diabetics on insulin: Notify the staff so that you can be scheduled 1st case in the morning. If your diabetes requires high dose insulin, take only  of your normal insulin dose the morning of the procedure and notify the staff that you have done so. . Preventing infections: Shower with an antibacterial soap the morning of your procedure. . Build-up your immune system: Take 1000 mg of Vitamin C with every meal (3 times a day) the day prior to your procedure. . Antibiotics: Inform the staff if you have a condition or reason that requires you to take antibiotics before dental procedures. . Pregnancy: If you are pregnant, call and cancel the procedure. . Sickness: If you have a cold, fever, or any active infections, call and cancel the procedure. . Arrival: You must be in the facility at least 30 minutes prior to your scheduled procedure. . Children: Do not bring children with you. . Dress appropriately: Bring dark clothing that you would not mind if they get stained. . Valuables: Do not bring any jewelry or valuables.  Procedure appointments are reserved for interventional treatments only. . No Prescription Refills. . No medication changes will be discussed during procedure  appointments. . No disability issues will be discussed.  Remember:  Regular Business hours are:  Monday to Thursday 8:00 AM to 4:00 PM  Provider's Schedule: Shirleyann Montero, MD:  Procedure days: Tuesday and Thursday 7:30 AM to 4:00 PM  Bilal Lateef, MD:  Procedure days: Monday and Wednesday 7:30 AM to 4:00 PM ____________________________________________________________________________________________   ____________________________________________________________________________________________  Pain Scale  Introduction: The pain score used by this practice is the Verbal Numerical Rating Scale (VNRS-11). This is an 11-point scale. It is for adults and children 10 years or older. There are significant differences in how the pain score is reported, used, and applied. Forget everything you learned in the past and learn this scoring system.  General Information: The scale should reflect your current level of pain. Unless you are specifically asked for the level of your worst pain, or your average pain. If you are asked for one of these two, then it should be understood that it is over the past 24 hours.  Basic Activities of Daily Living (ADL): Personal hygiene, dressing, eating, transferring, and using restroom.  Instructions: Most patients tend to report their level of pain as a combination of two factors, their physical pain and their psychosocial pain. This last one is also known as "suffering" and it is reflection of how physical pain affects you socially and psychologically. From now on, report them separately. From this point on, when asked to report your pain level, report only your physical pain. Use the following table for reference.  Pain Clinic Pain Levels (0-5/10)  Pain Level Score  Description  No Pain 0   Mild pain 1 Nagging, annoying, but does not interfere   with basic activities of daily living (ADL). Patients are able to eat, bathe, get dressed, toileting (being able to  get on and off the toilet and perform personal hygiene functions), transfer (move in and out of bed or a chair without assistance), and maintain continence (able to control bladder and bowel functions). Blood pressure and heart rate are unaffected. A normal heart rate for a healthy adult ranges from 60 to 100 bpm (beats per minute).   Mild to moderate pain 2 Noticeable and distracting. Impossible to hide from other people. More frequent flare-ups. Still possible to adapt and function close to normal. It can be very annoying and may have occasional stronger flare-ups. With discipline, patients may get used to it and adapt.   Moderate pain 3 Interferes significantly with activities of daily living (ADL). It becomes difficult to feed, bathe, get dressed, get on and off the toilet or to perform personal hygiene functions. Difficult to get in and out of bed or a chair without assistance. Very distracting. With effort, it can be ignored when deeply involved in activities.   Moderately severe pain 4 Impossible to ignore for more than a few minutes. With effort, patients may still be able to manage work or participate in some social activities. Very difficult to concentrate. Signs of autonomic nervous system discharge are evident: dilated pupils (mydriasis); mild sweating (diaphoresis); sleep interference. Heart rate becomes elevated (>115 bpm). Diastolic blood pressure (lower number) rises above 100 mmHg. Patients find relief in laying down and not moving.   Severe pain 5 Intense and extremely unpleasant. Associated with frowning face and frequent crying. Pain overwhelms the senses.  Ability to do any activity or maintain social relationships becomes significantly limited. Conversation becomes difficult. Pacing back and forth is common, as getting into a comfortable position is nearly impossible. Pain wakes you up from deep sleep. Physical signs will be obvious: pupillary dilation; increased sweating; goosebumps;  brisk reflexes; cold, clammy hands and feet; nausea, vomiting or dry heaves; loss of appetite; significant sleep disturbance with inability to fall asleep or to remain asleep. When persistent, significant weight loss is observed due to the complete loss of appetite and sleep deprivation.  Blood pressure and heart rate becomes significantly elevated. Caution: If elevated blood pressure triggers a pounding headache associated with blurred vision, then the patient should immediately seek attention at an urgent or emergency care unit, as these may be signs of an impending stroke.    Emergency Department Pain Levels (6-10/10)  Emergency Room Pain 6 Severely limiting. Requires emergency care and should not be seen or managed at an outpatient pain management facility. Communication becomes difficult and requires great effort. Assistance to reach the emergency department may be required. Facial flushing and profuse sweating along with potentially dangerous increases in heart rate and blood pressure will be evident.   Distressing pain 7 Self-care is very difficult. Assistance is required to transport, or use restroom. Assistance to reach the emergency department will be required. Tasks requiring coordination, such as bathing and getting dressed become very difficult.   Disabling pain 8 Self-care is no longer possible. At this level, pain is disabling. The individual is unable to do even the most "basic" activities such as walking, eating, bathing, dressing, transferring to a bed, or toileting. Fine motor skills are lost. It is difficult to think clearly.   Incapacitating pain 9 Pain becomes incapacitating. Thought processing is no longer possible. Difficult to remember your own name. Control of movement and coordination are lost.     The worst pain imaginable 10 At this level, most patients pass out from pain. When this level is reached, collapse of the autonomic nervous system occurs, leading to a sudden drop in  blood pressure and heart rate. This in turn results in a temporary and dramatic drop in blood flow to the brain, leading to a loss of consciousness. Fainting is one of the body's self defense mechanisms. Passing out puts the brain in a calmed state and causes it to shut down for a while, in order to begin the healing process.    Summary: 1. Refer to this scale when providing us with your pain level. 2. Be accurate and careful when reporting your pain level. This will help with your care. 3. Over-reporting your pain level will lead to loss of credibility. 4. Even a level of 1/10 means that there is pain and will be treated at our facility. 5. High, inaccurate reporting will be documented as "Symptom Exaggeration", leading to loss of credibility and suspicions of possible secondary gains such as obtaining more narcotics, or wanting to appear disabled, for fraudulent reasons. 6. Only pain levels of 5 or below will be seen at our facility. 7. Pain levels of 6 and above will be sent to the Emergency Department and the appointment cancelled. ____________________________________________________________________________________________    

## 2017-10-30 NOTE — Progress Notes (Signed)
Nursing Pain Medication Assessment:  Safety precautions to be maintained throughout the outpatient stay will include: orient to surroundings, keep bed in low position, maintain call bell within reach at all times, provide assistance with transfer out of bed and ambulation.  Medication Inspection Compliance: Pill count conducted under aseptic conditions, in front of the patient. Neither the pills nor the bottle was removed from the patient's sight at any time. Once count was completed pills were immediately returned to the patient in their original bottle.  Medication: Hydrocodone/APAP Pill/Patch Count: 7 of 60 pills remain Pill/Patch Appearance: Markings consistent with prescribed medication Bottle Appearance: Standard pharmacy container. Clearly labeled. Filled Date: 5 / 13 / 2019 Last Medication intake:  Today

## 2017-11-26 ENCOUNTER — Encounter: Payer: Self-pay | Admitting: Emergency Medicine

## 2017-11-26 ENCOUNTER — Other Ambulatory Visit: Payer: Self-pay

## 2017-11-26 ENCOUNTER — Emergency Department: Payer: Medicare Other

## 2017-11-26 ENCOUNTER — Emergency Department
Admission: EM | Admit: 2017-11-26 | Discharge: 2017-11-26 | Disposition: A | Discharge status: Home / Self Care | Payer: Medicare Other | Attending: Emergency Medicine | Admitting: Emergency Medicine

## 2017-11-26 DIAGNOSIS — M25511 Pain in right shoulder: Secondary | ICD-10-CM | POA: Insufficient documentation

## 2017-11-26 DIAGNOSIS — M19041 Primary osteoarthritis, right hand: Secondary | ICD-10-CM | POA: Insufficient documentation

## 2017-11-26 MED ORDER — METHYLPREDNISOLONE 4 MG PO TABS: ORAL_TABLET | ORAL | 0 refills | Status: DC

## 2017-11-26 NOTE — ED Triage Notes (Signed)
Pt states she tripped and fell on Sunday, having pain to right shoulder and fifth digit on right hand, appears in NAD.
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
## 2017-11-26 NOTE — Discharge Instructions (Addendum)
Please rest and ice the shoulder and hand.  Use sling as needed for shoulder pain no more than 1 week.  Coumadin sling daily to shower and sleep Medrol Dosepak as prescribed.  If no improvement in 1 week follow-up with primary care provider.  Return to the ER for any worsening symptoms or urgent changes in

## 2017-11-28 ENCOUNTER — Encounter: Payer: Self-pay | Admitting: Pain Medicine

## 2017-11-28 ENCOUNTER — Ambulatory Visit
Admission: RE | Admit: 2017-11-28 | Discharge: 2017-11-28 | Disposition: A | Discharge status: Home / Self Care | Payer: Medicare Other | Source: Ambulatory Visit | Attending: Pain Medicine | Admitting: Pain Medicine

## 2017-11-28 ENCOUNTER — Other Ambulatory Visit: Payer: Self-pay

## 2017-11-28 ENCOUNTER — Ambulatory Visit (HOSPITAL_BASED_OUTPATIENT_CLINIC_OR_DEPARTMENT_OTHER): Payer: Medicare Other | Admitting: Pain Medicine

## 2017-11-28 VITALS — BP 138/85 | HR 95 | Temp 97.3°F | Resp 16 | Ht 62.0 in | Wt 232.0 lb

## 2017-11-28 DIAGNOSIS — Z90710 Acquired absence of both cervix and uterus: Secondary | ICD-10-CM | POA: Insufficient documentation

## 2017-11-28 DIAGNOSIS — Z91041 Radiographic dye allergy status: Secondary | ICD-10-CM | POA: Insufficient documentation

## 2017-11-28 DIAGNOSIS — Z885 Allergy status to narcotic agent status: Secondary | ICD-10-CM | POA: Diagnosis not present

## 2017-11-28 DIAGNOSIS — M961 Postlaminectomy syndrome, not elsewhere classified: Secondary | ICD-10-CM | POA: Insufficient documentation

## 2017-11-28 DIAGNOSIS — F419 Anxiety disorder, unspecified: Secondary | ICD-10-CM | POA: Diagnosis not present

## 2017-11-28 DIAGNOSIS — G8929 Other chronic pain: Secondary | ICD-10-CM | POA: Insufficient documentation

## 2017-11-28 DIAGNOSIS — M5442 Lumbago with sciatica, left side: Secondary | ICD-10-CM

## 2017-11-28 DIAGNOSIS — Z9884 Bariatric surgery status: Secondary | ICD-10-CM | POA: Insufficient documentation

## 2017-11-28 DIAGNOSIS — M5117 Intervertebral disc disorders with radiculopathy, lumbosacral region: Secondary | ICD-10-CM | POA: Diagnosis not present

## 2017-11-28 DIAGNOSIS — E1142 Type 2 diabetes mellitus with diabetic polyneuropathy: Secondary | ICD-10-CM

## 2017-11-28 DIAGNOSIS — M549 Dorsalgia, unspecified: Secondary | ICD-10-CM | POA: Diagnosis present

## 2017-11-28 DIAGNOSIS — Z79890 Hormone replacement therapy (postmenopausal): Secondary | ICD-10-CM | POA: Diagnosis not present

## 2017-11-28 DIAGNOSIS — M5137 Other intervertebral disc degeneration, lumbosacral region: Secondary | ICD-10-CM | POA: Diagnosis not present

## 2017-11-28 DIAGNOSIS — Z79891 Long term (current) use of opiate analgesic: Secondary | ICD-10-CM | POA: Diagnosis not present

## 2017-11-28 DIAGNOSIS — Z950 Presence of cardiac pacemaker: Secondary | ICD-10-CM | POA: Diagnosis not present

## 2017-11-28 DIAGNOSIS — Z79899 Other long term (current) drug therapy: Secondary | ICD-10-CM | POA: Diagnosis not present

## 2017-11-28 DIAGNOSIS — M79605 Pain in left leg: Secondary | ICD-10-CM | POA: Insufficient documentation

## 2017-11-28 DIAGNOSIS — M5441 Lumbago with sciatica, right side: Secondary | ICD-10-CM

## 2017-11-28 DIAGNOSIS — M79604 Pain in right leg: Secondary | ICD-10-CM | POA: Insufficient documentation

## 2017-11-28 DIAGNOSIS — Z888 Allergy status to other drugs, medicaments and biological substances status: Secondary | ICD-10-CM | POA: Insufficient documentation

## 2017-11-28 DIAGNOSIS — Z7982 Long term (current) use of aspirin: Secondary | ICD-10-CM | POA: Insufficient documentation

## 2017-11-28 DIAGNOSIS — M5416 Radiculopathy, lumbar region: Secondary | ICD-10-CM

## 2017-11-28 DIAGNOSIS — Z7952 Long term (current) use of systemic steroids: Secondary | ICD-10-CM | POA: Insufficient documentation

## 2017-11-28 DIAGNOSIS — Z886 Allergy status to analgesic agent status: Secondary | ICD-10-CM | POA: Insufficient documentation

## 2017-11-28 DIAGNOSIS — Z9049 Acquired absence of other specified parts of digestive tract: Secondary | ICD-10-CM | POA: Insufficient documentation

## 2017-11-28 MED ORDER — TRIAMCINOLONE ACETONIDE 40 MG/ML IJ SUSP
40.0000 mg | Freq: Once | INTRAMUSCULAR | Status: AC
  Administered 2017-11-28: 40 mg
  Filled 2017-11-28: qty 1

## 2017-11-28 MED ORDER — ROPIVACAINE HCL 2 MG/ML IJ SOLN
2.0000 mL | Freq: Once | INTRAMUSCULAR | Status: AC
  Administered 2017-11-28: 2 mL via EPIDURAL
  Filled 2017-11-28: qty 10

## 2017-11-28 MED ORDER — SODIUM CHLORIDE 0.9% FLUSH
2.0000 mL | Freq: Once | INTRAVENOUS | Status: AC
  Administered 2017-11-28: 10 mL

## 2017-11-28 MED ORDER — ROPIVACAINE HCL 2 MG/ML IJ SOLN
INTRAMUSCULAR | Status: AC
  Filled 2017-11-28: qty 10

## 2017-11-28 MED ORDER — FENTANYL CITRATE (PF) 100 MCG/2ML IJ SOLN
25.0000 ug | INTRAMUSCULAR | Status: DC | PRN
  Administered 2017-11-28: 50 ug via INTRAVENOUS
  Filled 2017-11-28: qty 2

## 2017-11-28 MED ORDER — LIDOCAINE HCL 2 % IJ SOLN
20.0000 mL | Freq: Once | INTRAMUSCULAR | Status: AC
  Administered 2017-11-28: 400 mg
  Filled 2017-11-28: qty 40

## 2017-11-28 MED ORDER — MIDAZOLAM HCL 5 MG/5ML IJ SOLN
1.0000 mg | INTRAMUSCULAR | Status: DC | PRN
  Administered 2017-11-28: 1 mg via INTRAVENOUS
  Filled 2017-11-28: qty 5

## 2017-11-28 MED ORDER — LACTATED RINGERS IV SOLN
1000.0000 mL | Freq: Once | INTRAVENOUS | Status: AC
  Administered 2017-11-28: 1000 mL via INTRAVENOUS

## 2017-11-28 NOTE — Progress Notes (Signed)
Patient's Name: Carrie Gardner  MRN: [id number]  Referring Provider: Delano Metz, MD  DOB: 1950/10/14  PCP: Titus Mould, NP  DOS: 11/28/2017  Note by: Oswaldo Done, MD  Service setting: Ambulatory outpatient  Specialty: Interventional Pain Management  Patient type: Established  Location: [facility] (AMB) Pain Management Facility  Visit type: Interventional Procedure   Primary Reason for Visit: Interventional Pain Management Treatment. CC: Back Pain  Procedure:          Anesthesia, Analgesia, Anxiolysis:  Type: Diagnostic Epidural Steroid Injection #2  Region: Caudal Level: Sacrococcygeal   Laterality: Midline       Type: Moderate (Conscious) Sedation combined with Local Anesthesia Indication(s): Analgesia and Anxiety Route: Intravenous (IV) IV Access: Secured Sedation: Meaningful verbal contact was maintained at all times during the procedure  Local Anesthetic: Lidocaine 1-2%   Indications: 1. DDD (degenerative disc disease), lumbosacral   2. Failed back surgical syndrome (x2)   3. Chronic lumbar radiculopathy   4. Chronic lower extremity pain (Tertiary Area of Pain) (Bilateral) (R>L)   5. 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 (year old), female patient, seen today for interventional treatment. She  has a past surgical history that includes Spine surgery; Cholecystectomy; Abdominal hysterectomy; Pacemaker insertion; Laparoscopic gastric banding; Laparoscopic gastric band removal with laparoscopic gastric sleeve resection; LEFT HEART CATH AND CORONARY ANGIOGRAPHY (Left, 11/08/2016); and Colonoscopy. Carrie Gardner has a current medication list which includes the following prescription(s): aspirin, gnp calcium 1200, dapagliflozin propanediol, diclofenac sodium, furosemide, glucagon, hydrocodone-acetaminophen,  levothyroxine, linzess, methylprednisolone, metoprolol tartrate, nortriptyline, vitamin d (ergocalciferol), and omeprazole, and the following Facility-Administered Medications: fentanyl and midazolam. Her primarily concern today is the Back Pain  Initial Vital Signs:  Pulse/HCG Rate: 96ECG Heart Rate: 97 Temp: 98.2 F (36.8 C) Resp: 16 BP: (!) 150/88 SpO2: 100 %  BMI: Estimated body mass index is 42.43 kg/m as calculated from the following:   Height as of this encounter: 5\' 2"  (1.575 m).   Weight as of this encounter: 232 lb (105.2 kg).  Risk Assessment: Allergies: Reviewed. She is allergic to metformin; metformin and related; pregabalin; ibuprofen; iodinated diagnostic agents; lisinopril; mobic [meloxicam]; oxycodone; oxycodone-acetaminophen; and tramadol.  Allergy Precautions: No iodine containing solutions or radiological contrast used. Coagulopathies: Reviewed. None identified.  Blood-thinner therapy: None at this time Active Infection(s): Reviewed. None identified. Carrie Gardner is afebrile  Site Confirmation: Carrie Gardner was asked to confirm the procedure and laterality before marking the site Procedure checklist: Completed Consent: Before the procedure and under the influence of no sedative(s), amnesic(s), or anxiolytics, the patient was informed of the treatment options, risks and possible complications. To fulfill our ethical and legal obligations, as recommended by the American Medical Association's Code of Ethics, I have informed the patient of my clinical impression; the nature and purpose of the treatment or procedure; the risks, benefits, and possible complications of the intervention; the alternatives, including doing nothing; the risk(s) and benefit(s) of the alternative treatment(s) or procedure(s); and the risk(s) and benefit(s) of doing nothing. The patient was provided information about the general risks and possible complications associated with the procedure. These may  include, but are not limited to: failure to achieve desired goals, infection, bleeding, organ or nerve damage, allergic reactions, paralysis, and death. In addition, the patient was informed of those risks and complications associated to Spine-related procedures, such  as failure to decrease pain; infection (i.e.: Meningitis, epidural or intraspinal abscess); bleeding (i.e.: epidural hematoma, subarachnoid hemorrhage, or any other type of intraspinal or peri-dural bleeding); organ or nerve damage (i.e.: Any type of peripheral nerve, nerve root, or spinal cord injury) with subsequent damage to sensory, motor, and/or autonomic systems, resulting in permanent pain, numbness, and/or weakness of one or several areas of the body; allergic reactions; (i.e.: anaphylactic reaction); and/or death. Furthermore, the patient was informed of those risks and complications associated with the medications. These include, but are not limited to: allergic reactions (i.e.: anaphylactic or anaphylactoid reaction(s)); adrenal axis suppression; blood sugar elevation that in diabetics may result in ketoacidosis or comma; water retention that in patients with history of congestive heart failure may result in shortness of breath, pulmonary edema, and decompensation with resultant heart failure; weight gain; swelling or edema; medication-induced neural toxicity; particulate matter embolism and blood vessel occlusion with resultant organ, and/or nervous system infarction; and/or aseptic necrosis of one or more joints. Finally, the patient was informed that Medicine is not an exact science; therefore, there is also the possibility of unforeseen or unpredictable risks and/or possible complications that may result in a catastrophic outcome. The patient indicated having understood very clearly. We have given the patient no guarantees and we have made no promises. Enough time was given to the patient to ask questions, all of which were answered  to the patient's satisfaction. Carrie Gardner has indicated that she wanted to continue with the procedure. Attestation: I, the ordering provider, attest that I have discussed with the patient the benefits, risks, side-effects, alternatives, likelihood of achieving goals, and potential problems during recovery for the procedure that I have provided informed consent. Date  Time:   Pre-Procedure Preparation:  Monitoring: As per clinic protocol. Respiration, ETCO2, SpO2, BP, heart rate and rhythm monitor placed and checked for adequate function Safety Precautions: Patient was assessed for positional comfort and pressure points before starting the procedure. Time-out: I initiated and conducted the "Time-out" before starting the procedure, as per protocol. The patient was asked to participate by confirming the accuracy of the "Time Out" information. Verification of the correct person, site, and procedure were performed and confirmed by me, the nursing staff, and the patient. "Time-out" conducted as per Joint Commission's Universal Protocol (UP.01.01.01). Time: 1017  Description of Procedure:          Position: Prone Target Area: Caudal Epidural Canal. Approach: Midline approach. Area Prepped: Entire Posterior Sacrococcygeal Region Prepping solution: ChloraPrep (2% chlorhexidine gluconate and 70% isopropyl alcohol) Safety Precautions: Aspiration looking for blood return was conducted prior to all injections. At no point did we inject any substances, as a needle was being advanced. No attempts were made at seeking any paresthesias. Safe injection practices and needle disposal techniques used. Medications properly checked for expiration dates. SDV (single dose vial) medications used. Description of the Procedure: Protocol guidelines were followed. The patient was placed in position over the fluoroscopy table. The target area was identified and the area prepped in the usual manner. Skin & deeper tissues  infiltrated with local anesthetic. Appropriate amount of time allowed to pass for local anesthetics to take effect. The procedure needles were then advanced to the target area. Proper needle placement secured. Negative aspiration confirmed. Solution injected in intermittent fashion, asking for systemic symptoms every 0.5cc of injectate. The needles were then removed and the area cleansed, making sure to leave some of the prepping solution back to take advantage of its long term bactericidal  properties. Vitals:   11/28/17 1025 11/28/17 1035 11/28/17 1045 11/28/17 1055  BP: (!) 148/88 128/84 (!) 125/104 138/85  Pulse: 95     Resp: (!) 22 18 18 16   Temp:  97.7 F (36.5 C)  (!) 97.3 F (36.3 C)  TempSrc:      SpO2: 98% 97% 99% 100%  Weight:      Height:        Start Time: 1017 hrs. End Time: 1022 hrs. Materials:  Needle(s) Type: Epidural needle Gauge: 17G Length: 3.5-in Medication(s): Please see orders for medications and dosing details.  Imaging Guidance (Spinal):          Type of Imaging Technique: Fluoroscopy Guidance (Spinal) Indication(s): Assistance in needle guidance and placement for procedures requiring needle placement in or near specific anatomical locations not easily accessible without such assistance. Exposure Time: Please see nurses notes. Contrast: Before injecting any contrast, we confirmed that the patient did not have an allergy to iodine, shellfish, or radiological contrast. Once satisfactory needle placement was completed at the desired level, radiological contrast was injected. Contrast injected under live fluoroscopy. No contrast complications. See chart for type and volume of contrast used. Fluoroscopic Guidance: I was personally present during the use of fluoroscopy. "Tunnel Vision Technique" used to obtain the best possible view of the target area. Parallax error corrected before commencing the procedure. "Direction-depth-direction" technique used to introduce the  needle under continuous pulsed fluoroscopy. Once target was reached, antero-posterior, oblique, and lateral fluoroscopic projection used confirm needle placement in all planes. Images permanently stored in EMR. Interpretation: I personally interpreted the imaging intraoperatively. Adequate needle placement confirmed in multiple planes. Appropriate spread of contrast into desired area was observed. No evidence of afferent or efferent intravascular uptake. No intrathecal or subarachnoid spread observed. Permanent images saved into the patient's record.  Antibiotic Prophylaxis:   Anti-infectives (From admission, onward)   None     Indication(s): None identified  Post-operative Assessment:  Post-procedure Vital Signs:  Pulse/HCG Rate: 9592 Temp: (!) 97.3 F (36.3 C) Resp: 16 BP: 138/85 SpO2: 100 %  EBL: None  Complications: No immediate post-treatment complications observed by team, or reported by patient.  Note: The patient tolerated the entire procedure well. A repeat set of vitals were taken after the procedure and the patient was kept under observation following institutional policy, for this type of procedure. Post-procedural neurological assessment was performed, showing return to baseline, prior to discharge. The patient was provided with post-procedure discharge instructions, including a section on how to identify potential problems. Should any problems arise concerning this procedure, the patient was given instructions to immediately contact , at any time, without hesitation. In any case, we plan to contact the patient by telephone for a follow-up status report regarding this interventional procedure.  Comments:  No additional relevant information.  Plan of Care    Imaging Orders     DG C-Arm 1-60 Min-No Report  Procedure Orders     Caudal Epidural Injection  Medications ordered for procedure: Meds ordered this encounter  Medications  . lidocaine (XYLOCAINE) 2 % (with  pres) injection 400 mg  . midazolam (VERSED) 5 MG/5ML injection 1-2 mg    Make sure Flumazenil is available in the pyxis when using this medication. If oversedation occurs, administer 0.2 mg IV over 15 sec. If after 45 sec no response, administer 0.2 mg again over 1 min; may repeat at 1 min intervals; not to exceed 4 doses (1 mg)  . fentaNYL (SUBLIMAZE) injection 25-50 mcg  Make sure Narcan is available in the pyxis when using this medication. In the event of respiratory depression (RR< 8/min): Titrate NARCAN (naloxone) in increments of 0.1 to 0.2 mg IV at 2-3 minute intervals, until desired degree of reversal.  . lactated ringers infusion 1,000 mL  . sodium chloride flush (NS) 0.9 % injection 2 mL  . ropivacaine (PF) 2 mg/mL (0.2%) (NAROPIN) injection 2 mL  . triamcinolone acetonide (KENALOG-40) injection 40 mg   Medications administered: We administered lidocaine, midazolam, fentaNYL, lactated ringers, sodium chloride flush, ropivacaine (PF) 2 mg/mL (0.2%), and triamcinolone acetonide.  See the medical record for exact dosing, route, and time of administration.  New Prescriptions   No medications on file   Disposition: Discharge home  Discharge Date & Time: 11/28/2017; 1059 hrs.   Physician-requested Follow-up: Return for post-procedure eval (2 wks), w/ Dr. Laban Emperor.  Future Appointments  Date Time Provider Department Center  12/02/2017 11:00 AM Barbette Merino, NP [facility] None  12/11/2017  2:00 PM Delano Metz, MD [facility] None   Primary Care Physician: Titus Mould, NP Location: [facility] Note by: Oswaldo Done, MD Date: 11/28/2017; Time: 12:03 PM  Disclaimer:  Medicine is not an Visual merchandiser. The only guarantee in medicine is that nothing is guaranteed. It is important to note that the decision to proceed with this intervention was based on the information collected from the patient. The Data and conclusions were drawn from  the patient's questionnaire, the interview, and the physical examination. Because the information was provided in large part by the patient, it cannot be guaranteed that it has not been purposely or unconsciously manipulated. Every effort has been made to obtain as much relevant data as possible for this evaluation. It is important to note that the conclusions that lead to this procedure are derived in large part from the available data. Always take into account that the treatment will also be dependent on availability of resources and existing treatment guidelines, considered by other Pain Management Practitioners as being common knowledge and practice, at the time of the intervention. For Medico-Legal purposes, it is also important to point out that variation in procedural techniques and pharmacological choices are the acceptable norm. The indications, contraindications, technique, and results of the above procedure should only be interpreted and judged by a Board-Certified Interventional Pain Specialist with extensive familiarity and expertise in the same exact procedure and technique.

## 2017-11-28 NOTE — Progress Notes (Signed)
Safety precautions to be maintained throughout the outpatient stay will include: orient to surroundings, keep bed in low position, maintain call bell within reach at all times, provide assistance with transfer out of bed and ambulation.  

## 2017-11-28 NOTE — Patient Instructions (Signed)

## 2017-11-29 ENCOUNTER — Telehealth: Payer: Self-pay

## 2017-11-29 NOTE — Telephone Encounter (Signed)
Post procedure phone call.  Call would not go through

## 2017-12-02 ENCOUNTER — Encounter: Payer: Self-pay | Admitting: Nurse Practitioner

## 2017-12-02 ENCOUNTER — Ambulatory Visit: Payer: Medicare Other | Attending: Nurse Practitioner | Admitting: Nurse Practitioner

## 2017-12-02 ENCOUNTER — Other Ambulatory Visit: Payer: Self-pay

## 2017-12-02 VITALS — BP 152/110 | HR 125 | Temp 98.3°F | Ht 62.0 in | Wt 232.0 lb

## 2017-12-02 DIAGNOSIS — M5416 Radiculopathy, lumbar region: Secondary | ICD-10-CM | POA: Diagnosis not present

## 2017-12-02 DIAGNOSIS — E785 Hyperlipidemia, unspecified: Secondary | ICD-10-CM | POA: Insufficient documentation

## 2017-12-02 DIAGNOSIS — Z7982 Long term (current) use of aspirin: Secondary | ICD-10-CM | POA: Insufficient documentation

## 2017-12-02 DIAGNOSIS — M797 Fibromyalgia: Secondary | ICD-10-CM | POA: Diagnosis not present

## 2017-12-02 DIAGNOSIS — N189 Chronic kidney disease, unspecified: Secondary | ICD-10-CM | POA: Diagnosis not present

## 2017-12-02 DIAGNOSIS — G2581 Restless legs syndrome: Secondary | ICD-10-CM | POA: Diagnosis not present

## 2017-12-02 DIAGNOSIS — Z5181 Encounter for therapeutic drug level monitoring: Secondary | ICD-10-CM | POA: Diagnosis present

## 2017-12-02 DIAGNOSIS — K210 Gastro-esophageal reflux disease with esophagitis: Secondary | ICD-10-CM | POA: Insufficient documentation

## 2017-12-02 DIAGNOSIS — M545 Low back pain: Secondary | ICD-10-CM | POA: Diagnosis not present

## 2017-12-02 DIAGNOSIS — Z9049 Acquired absence of other specified parts of digestive tract: Secondary | ICD-10-CM | POA: Insufficient documentation

## 2017-12-02 DIAGNOSIS — M5137 Other intervertebral disc degeneration, lumbosacral region: Secondary | ICD-10-CM | POA: Diagnosis not present

## 2017-12-02 DIAGNOSIS — I129 Hypertensive chronic kidney disease with stage 1 through stage 4 chronic kidney disease, or unspecified chronic kidney disease: Secondary | ICD-10-CM | POA: Diagnosis not present

## 2017-12-02 DIAGNOSIS — E559 Vitamin D deficiency, unspecified: Secondary | ICD-10-CM | POA: Insufficient documentation

## 2017-12-02 DIAGNOSIS — J449 Chronic obstructive pulmonary disease, unspecified: Secondary | ICD-10-CM | POA: Diagnosis not present

## 2017-12-02 DIAGNOSIS — Z79891 Long term (current) use of opiate analgesic: Secondary | ICD-10-CM | POA: Diagnosis not present

## 2017-12-02 DIAGNOSIS — I2510 Atherosclerotic heart disease of native coronary artery without angina pectoris: Secondary | ICD-10-CM | POA: Insufficient documentation

## 2017-12-02 DIAGNOSIS — M79604 Pain in right leg: Secondary | ICD-10-CM | POA: Insufficient documentation

## 2017-12-02 DIAGNOSIS — Z6841 Body Mass Index (BMI) 40.0 and over, adult: Secondary | ICD-10-CM | POA: Insufficient documentation

## 2017-12-02 DIAGNOSIS — M159 Polyosteoarthritis, unspecified: Secondary | ICD-10-CM

## 2017-12-02 DIAGNOSIS — M1990 Unspecified osteoarthritis, unspecified site: Secondary | ICD-10-CM | POA: Insufficient documentation

## 2017-12-02 DIAGNOSIS — G894 Chronic pain syndrome: Secondary | ICD-10-CM | POA: Diagnosis not present

## 2017-12-02 DIAGNOSIS — E1122 Type 2 diabetes mellitus with diabetic chronic kidney disease: Secondary | ICD-10-CM | POA: Insufficient documentation

## 2017-12-02 DIAGNOSIS — Z79899 Other long term (current) drug therapy: Secondary | ICD-10-CM | POA: Diagnosis not present

## 2017-12-02 DIAGNOSIS — F411 Generalized anxiety disorder: Secondary | ICD-10-CM | POA: Insufficient documentation

## 2017-12-02 DIAGNOSIS — Z9889 Other specified postprocedural states: Secondary | ICD-10-CM | POA: Insufficient documentation

## 2017-12-02 DIAGNOSIS — E1142 Type 2 diabetes mellitus with diabetic polyneuropathy: Secondary | ICD-10-CM | POA: Insufficient documentation

## 2017-12-02 DIAGNOSIS — M25511 Pain in right shoulder: Secondary | ICD-10-CM | POA: Diagnosis not present

## 2017-12-02 DIAGNOSIS — Z79890 Hormone replacement therapy (postmenopausal): Secondary | ICD-10-CM | POA: Insufficient documentation

## 2017-12-02 DIAGNOSIS — M150 Primary generalized (osteo)arthritis: Secondary | ICD-10-CM

## 2017-12-02 MED ORDER — DICLOFENAC SODIUM 1 % TD GEL: 4.0000 g | Freq: Four times a day (QID) | TRANSDERMAL | 99 refills | Status: DC

## 2017-12-02 MED ORDER — HYDROCODONE-ACETAMINOPHEN 5-325 MG PO TABS: 1.0000 | ORAL_TABLET | Freq: Two times a day (BID) | ORAL | 0 refills | Status: DC | PRN

## 2017-12-02 MED ORDER — LIDOCAINE 5 % EX OINT: 1.0000 "application " | TOPICAL_OINTMENT | Freq: Four times a day (QID) | CUTANEOUS | 2 refills | Status: DC | PRN

## 2017-12-02 MED ORDER — GNP CALCIUM 1200 1200-1000 MG-UNIT PO CHEW: 1200.0000 mg | CHEWABLE_TABLET | Freq: Every day | ORAL | 5 refills | Status: AC

## 2017-12-02 NOTE — Patient Instructions (Signed)
____________________________________________________________________________________________  Medication Rules  Applies to: All patients receiving prescriptions (written or electronic).  Pharmacy of record: Pharmacy where electronic prescriptions will be sent. If written prescriptions are taken to a different pharmacy, please inform the nursing staff. The pharmacy listed in the electronic medical record should be the one where you would like electronic prescriptions to be sent.  Prescription refills: Only during scheduled appointments. Applies to both, written and electronic prescriptions.  NOTE: The following applies primarily to controlled substances (Opioid* Pain Medications).   Patient's responsibilities: 1. Pain Pills: Bring all pain pills to every appointment (except for procedure appointments). 2. Pill Bottles: Bring pills in original pharmacy bottle. Always bring newest bottle. Bring bottle, even if empty. 3. Medication refills: You are responsible for knowing and keeping track of what medications you need refilled. The day before your appointment, write a list of all prescriptions that need to be refilled. Bring that list to your appointment and give it to the admitting nurse. Prescriptions will be written only during appointments. If you forget a medication, it will not be "Called in", "Faxed", or "electronically sent". You will need to get another appointment to get these prescribed. 4. Prescription Accuracy: You are responsible for carefully inspecting your prescriptions before leaving our office. Have the discharge nurse carefully go over each prescription with you, before taking them home. Make sure that your name is accurately spelled, that your address is correct. Check the name and dose of your medication to make sure it is accurate. Check the number of pills, and the written instructions to make sure they are clear and accurate. Make sure that you are given enough medication to last  until your next medication refill appointment. 5. Taking Medication: Take medication as prescribed. Never take more pills than instructed. Never take medication more frequently than prescribed. Taking less pills or less frequently is permitted and encouraged, when it comes to controlled substances (written prescriptions).  6. Inform other Doctors: Always inform, all of your healthcare providers, of all the medications you take. 7. Pain Medication from other Providers: You are not allowed to accept any additional pain medication from any other Doctor or Healthcare provider. There are two exceptions to this rule. (see below) In the event that you require additional pain medication, you are responsible for notifying us, as stated below. 8. Medication Agreement: You are responsible for carefully reading and following our Medication Agreement. This must be signed before receiving any prescriptions from our practice. Safely store a copy of your signed Agreement. Violations to the Agreement will result in no further prescriptions. (Additional copies of our Medication Agreement are available upon request.) 9. Laws, Rules, & Regulations: All patients are expected to follow all Federal and State Laws, Statutes, Rules, & Regulations. Ignorance of the Laws does not constitute a valid excuse. The use of any illegal substances is prohibited. 10. Adopted CDC guidelines & recommendations: Target dosing levels will be at or below 60 MME/day. Use of benzodiazepines** is not recommended.  Exceptions: There are only two exceptions to the rule of not receiving pain medications from other Healthcare Providers. 1. Exception #1 (Emergencies): In the event of an emergency (i.e.: accident requiring emergency care), you are allowed to receive additional pain medication. However, you are responsible for: As soon as you are able, call our office [phone], at any time of the day or night, and leave a message stating your name, the  date and nature of the emergency, and the name and dose of the medication   prescribed. In the event that your call is answered by a member of our staff, make sure to document and save the date, time, and the name of the person that took your information.  2. Exception #2 (Planned Surgery): In the event that you are scheduled by another doctor or dentist to have any type of surgery or procedure, you are allowed (for a period no longer than 30 days), to receive additional pain medication, for the acute post-op pain. However, in this case, you are responsible for picking up a copy of our "Post-op Pain Management for Surgeons" handout, and giving it to your surgeon or dentist. This document is available at our office, and does not require an appointment to obtain it. Simply go to our office during business hours (Monday-Thursday from 8:00 AM to 4:00 PM) (Friday 8:00 AM to 12:00 Noon) or if you have a scheduled appointment with us, prior to your surgery, and ask for it by name. In addition, you will need to provide us with your name, name of your surgeon, type of surgery, and date of procedure or surgery.  *Opioid medications include: morphine, codeine, oxycodone, oxymorphone, hydrocodone, hydromorphone, meperidine, tramadol, tapentadol, buprenorphine, fentanyl, methadone. **Benzodiazepine medications include: diazepam (Valium), alprazolam (Xanax), clonazepam (Klonopine), lorazepam (Ativan), clorazepate (Tranxene), chlordiazepoxide (Librium), estazolam (Prosom), oxazepam (Serax), temazepam (Restoril), triazolam (Halcion) (Last updated: 07/18/2017) ____________________________________________________________________________________________    

## 2017-12-02 NOTE — Progress Notes (Signed)
Nursing Pain Medication Assessment:  Safety precautions to be maintained throughout the outpatient stay will include: orient to surroundings, keep bed in low position, maintain call bell within reach at all times, provide assistance with transfer out of bed and ambulation.  Medication Inspection Compliance: Pill count conducted under aseptic conditions, in front of the patient. Neither the pills nor the bottle was removed from the patient's sight at any time. Once count was completed pills were immediately returned to the patient in their original bottle.  Medication: Hydrocodone/APAP Pill/Patch Count: 4 of 60 pills remain Pill/Patch Appearance: Markings consistent with prescribed medication Bottle Appearance: Standard pharmacy container. Clearly labeled. Filled Date: 06 / 12 / 2019 Last Medication intake:  Yesterday

## 2017-12-02 NOTE — Progress Notes (Signed)
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 Persistent Controlled 1. DDD (degenerative disc disease), lumbosacral   2. Diabetic polyneuropathy associated with type 2 diabetes mellitus ([place])   3. Fibromyalgia   4. Vitamin D deficiency   5. Osteoarthritis   6. Chronic pain syndrome   7. Long term prescription opiate use     Problems updated and reviewed during this visit: No problems updated. Plan of Care  Pharmacotherapy (Medications Ordered): Meds ordered this encounter  Medications  . Calcium Carbonate-Vit D-Min (GNP CALCIUM 1200) 1200-1000 MG-UNIT CHEW    Sig: Chew 1,200 mg by mouth daily with breakfast. Take in combination with vitamin D and magnesium.    Dispense:  30 tablet    Refill:  5    Do not place medication on "Automatic Refill".  May substitute with similar over-the-counter product.    Order Specific Question:   Supervising Provider    Answer:   Milinda Pointer [phone]  . diclofenac sodium (VOLTAREN) 1 % GEL    Sig: Apply 4 g topically 4 (four) times daily.     Dispense:  100 g    Refill:  prn    Do not place medication on "Automatic Refill". Fill one day early if pharmacy is closed on scheduled refill date.    Order Specific Question:   Supervising Provider    Answer:   Milinda Pointer [phone]  . HYDROcodone-acetaminophen (NORCO/VICODIN) 5-325 MG tablet    Sig: Take 1 tablet by mouth 2 (two) times daily as needed for severe pain.    Dispense:  60 tablet    Refill:  0    Do not place this medication, or any other prescription from our practice, on "Automatic Refill". Patient may have prescription filled one day early if pharmacy is closed on scheduled refill date. Do not fill until: 12/04/2017 To last until: 01/03/2018    Order Specific Question:   Supervising Provider    Answer:   Milinda Pointer [phone]  . lidocaine (XYLOCAINE) 5 % ointment    Sig: Apply 1 application topically 4 (four) times daily as needed for moderate pain.    Dispense:  35.44 g    Refill:  2    Maximum dose: 5 g/application (approximately 6 inches of ointment); 20 g/day    Order Specific Question:   Supervising Provider  AnswerMilinda Pointer [[id number]]   New Prescriptions   LIDOCAINE (XYLOCAINE) 5 % OINTMENT    Apply 1 application topically 4 (four) times daily as needed for moderate pain.   Medications administered today: Theodore Rahrig. Friedli had no medications administered during this visit. Lab-work, procedure(s), and/or referral(s): Orders Placed This Encounter  Procedures  . ToxASSURE Select 13 (MW), Urine   Imaging and/or referral(s): None  Interventional management options: Planned, scheduled, and/or pending:   Not at this time   Considering:   Diagnostic midline CESI Diagnostic bilateral cervical facet block Possible bilateral cervical facet RFA Diagnostic bilateral lumbar facet nerve block Possiblebilateral lumbar facet RFA Diagnostic bilateral L4 transforaminal LESI Diagnostic right-sided L5-S1 LESI Diagnostic right-sided  caudal epidural steroid injection   Palliative PRN treatment(s):   None at this time     Provider-requested follow-up: Return in about 1 month (around 12/30/2017) for MedMgmt with Me Donella Stade Edison Pace).  Future Appointments  Date Time Provider [place]  12/11/2017  2:00 PM Milinda Pointer, MD [facility] None   Primary Care Physician: Carrie Jericho, NP Location: [facility] Note by: Vevelyn Francois NP Date: 12/02/2017; Time: 4:56 PM  Pain Score Disclaimer: We use the NRS-11 scale. This is a self-reported, subjective measurement of pain severity with only modest accuracy. It is used primarily to identify changes within a particular patient. It must be understood that outpatient pain scales are significantly less accurate that those used for research, where they can be applied under ideal controlled circumstances with minimal exposure to variables. In reality, the score is likely to be a combination of pain intensity and pain affect, where pain affect describes the degree of emotional arousal or changes in action readiness caused by the sensory experience of pain. Factors such as social and work situation, setting, emotional state, anxiety levels, expectation, and prior pain experience may influence pain perception and show large inter-individual differences that may also be affected by time variables.  Patient instructions provided during this appointment: Patient Instructions  ____________________________________________________________________________________________  Medication Rules  Applies to: All patients receiving prescriptions (written or electronic).  Pharmacy of record: Pharmacy where electronic prescriptions will be sent. If written prescriptions are taken to a different pharmacy, please inform the nursing staff. The pharmacy listed in the electronic medical record should be the one where you would like electronic prescriptions to be  sent.  Prescription refills: Only during scheduled appointments. Applies to both, written and electronic prescriptions.  NOTE: The following applies primarily to controlled substances (Opioid* Pain Medications).   Patient's responsibilities: 1. Pain Pills: Bring all pain pills to every appointment (except for procedure appointments). 2. Pill Bottles: Bring pills in original pharmacy bottle. Always bring newest bottle. Bring bottle, even if empty. 3. Medication refills: You are responsible for knowing and keeping track of what medications you need refilled. The day before your appointment, write a list of all prescriptions that need to be refilled. Bring that list to your appointment and give it to the admitting nurse. Prescriptions will be written only during appointments. If you forget a medication, it will not be "Called in", "Faxed", or "electronically sent". You will need to get another appointment to get these prescribed. 4. Prescription Accuracy: You are responsible for carefully inspecting your prescriptions before leaving our office. Have the discharge nurse carefully go over each prescription with you, before taking them home. Make sure that your name is accurately spelled, that your address is correct. Check the name and dose of  your medication to make sure it is accurate. Check the number of pills, and the written instructions to make sure they are clear and accurate. Make sure that you are given enough medication to last until your next medication refill appointment. 5. Taking Medication: Take medication as prescribed. Never take more pills than instructed. Never take medication more frequently than prescribed. Taking less pills or less frequently is permitted and encouraged, when it comes to controlled substances (written prescriptions).  6. Inform other Doctors: Always inform, all of your healthcare providers, of all the medications you take. 7. Pain Medication from other Providers: You  are not allowed to accept any additional pain medication from any other Doctor or Healthcare provider. There are two exceptions to this rule. (see below) In the event that you require additional pain medication, you are responsible for notifying us, as stated below. 8. Medication Agreement: You are responsible for carefully reading and following our Medication Agreement. This must be signed before receiving any prescriptions from our practice. Safely store a copy of your signed Agreement. Violations to the Agreement will result in no further prescriptions. (Additional copies of our Medication Agreement are available upon request.) 9. Laws, Rules, & Regulations: All patients are expected to follow all Federal and [employer], Rules, [employer]. Ignorance of the Laws does not constitute a valid excuse. The use of any illegal substances is prohibited. 10. Adopted CDC guidelines & recommendations: Target dosing levels will be at or below 60 MME/day. Use of benzodiazepines** is not recommended.  Exceptions: There are only two exceptions to the rule of not receiving pain medications from other Healthcare Providers. 1. Exception #1 (Emergencies): In the event of an emergency (i.e.: accident requiring emergency care), you are allowed to receive additional pain medication. However, you are responsible for: As soon as you are able, call our office [phone], at any time of the day or night, and leave a message stating your name, the date and nature of the emergency, and the name and dose of the medication prescribed. In the event that your call is answered by a member of our staff, make sure to document and save the date, time, and the name of the person that took your information.  2. Exception #2 (Planned Surgery): In the event that you are scheduled by another doctor or dentist to have any type of surgery or procedure, you are allowed (for a period no longer than 30 days), to receive additional pain  medication, for the acute post-op pain. However, in this case, you are responsible for picking up a copy of our "Post-op Pain Management for Surgeons" handout, and giving it to your surgeon or dentist. This document is available at our office, and does not require an appointment to obtain it. Simply go to our office during business hours (Monday-Thursday from 8:00 AM to 4:00 PM) (Friday 8:00 AM to 12:00 Noon) or if you have a scheduled appointment with [place], prior to your surgery, and ask for it by name. In addition, you will need to provide [place] with your name, name of your surgeon, type of surgery, and date of procedure or surgery.  *Opioid medications include: morphine, codeine, oxycodone, oxymorphone, hydrocodone, hydromorphone, meperidine, tramadol, tapentadol, buprenorphine, fentanyl, methadone. **Benzodiazepine medications include: diazepam (Valium), alprazolam (Xanax), clonazepam (Klonopine), lorazepam (Ativan), clorazepate (Tranxene), chlordiazepoxide (Librium), estazolam (Prosom), oxazepam (Serax), temazepam (Restoril), triazolam (Halcion) (Last updated: 07/18/2017) ____________________________________________________________________________________________

## 2017-12-06 LAB — TOXASSURE SELECT 13 (MW), URINE

## 2017-12-10 NOTE — Progress Notes (Signed)
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 block #2 Possiblebilateral lumbar facet RFA Diagnostic bilateral L4 transforaminal LESI Diagnostic right-sided L5-S1 LESI Diagnostic right-sided caudal epidural steroid injection #3   Palliative PRN treatment(s):   None at this time   Provider-requested follow-up: Return for Procedure (w/ sedation): (B) L-FCT BLK #2.  Future Appointments  Date Time Provider [place]  12/19/2017  8:15 AM Milinda Pointer, MD [facility] None   Primary Care Physician: Ricardo Jericho, NP Location: [facility] Note by: Gaspar Cola, MD Date: 12/11/2017; Time: 3:32 PM

## 2017-12-11 ENCOUNTER — Encounter: Payer: Self-pay | Admitting: Pain Medicine

## 2017-12-11 ENCOUNTER — Ambulatory Visit: Payer: Medicare Other | Attending: Pain Medicine | Admitting: Pain Medicine

## 2017-12-11 ENCOUNTER — Other Ambulatory Visit: Payer: Self-pay

## 2017-12-11 VITALS — BP 155/83 | HR 82 | Temp 98.0°F | Ht 62.0 in | Wt 232.0 lb

## 2017-12-11 DIAGNOSIS — G8929 Other chronic pain: Secondary | ICD-10-CM | POA: Insufficient documentation

## 2017-12-11 DIAGNOSIS — M79604 Pain in right leg: Secondary | ICD-10-CM | POA: Diagnosis not present

## 2017-12-11 DIAGNOSIS — G2581 Restless legs syndrome: Secondary | ICD-10-CM | POA: Insufficient documentation

## 2017-12-11 DIAGNOSIS — R51 Headache: Secondary | ICD-10-CM

## 2017-12-11 DIAGNOSIS — E559 Vitamin D deficiency, unspecified: Secondary | ICD-10-CM | POA: Insufficient documentation

## 2017-12-11 DIAGNOSIS — E1140 Type 2 diabetes mellitus with diabetic neuropathy, unspecified: Secondary | ICD-10-CM | POA: Insufficient documentation

## 2017-12-11 DIAGNOSIS — M797 Fibromyalgia: Secondary | ICD-10-CM | POA: Diagnosis not present

## 2017-12-11 DIAGNOSIS — M47817 Spondylosis without myelopathy or radiculopathy, lumbosacral region: Secondary | ICD-10-CM | POA: Insufficient documentation

## 2017-12-11 DIAGNOSIS — E785 Hyperlipidemia, unspecified: Secondary | ICD-10-CM | POA: Diagnosis not present

## 2017-12-11 DIAGNOSIS — Z794 Long term (current) use of insulin: Secondary | ICD-10-CM | POA: Diagnosis not present

## 2017-12-11 DIAGNOSIS — M5116 Intervertebral disc disorders with radiculopathy, lumbar region: Secondary | ICD-10-CM | POA: Diagnosis not present

## 2017-12-11 DIAGNOSIS — E039 Hypothyroidism, unspecified: Secondary | ICD-10-CM | POA: Insufficient documentation

## 2017-12-11 DIAGNOSIS — Z8673 Personal history of transient ischemic attack (TIA), and cerebral infarction without residual deficits: Secondary | ICD-10-CM | POA: Insufficient documentation

## 2017-12-11 DIAGNOSIS — Z79891 Long term (current) use of opiate analgesic: Secondary | ICD-10-CM | POA: Insufficient documentation

## 2017-12-11 DIAGNOSIS — Z6841 Body Mass Index (BMI) 40.0 and over, adult: Secondary | ICD-10-CM | POA: Diagnosis not present

## 2017-12-11 DIAGNOSIS — K219 Gastro-esophageal reflux disease without esophagitis: Secondary | ICD-10-CM | POA: Diagnosis not present

## 2017-12-11 DIAGNOSIS — M545 Low back pain: Secondary | ICD-10-CM | POA: Insufficient documentation

## 2017-12-11 DIAGNOSIS — M961 Postlaminectomy syndrome, not elsewhere classified: Secondary | ICD-10-CM

## 2017-12-11 DIAGNOSIS — E079 Disorder of thyroid, unspecified: Secondary | ICD-10-CM | POA: Diagnosis not present

## 2017-12-11 DIAGNOSIS — J449 Chronic obstructive pulmonary disease, unspecified: Secondary | ICD-10-CM | POA: Insufficient documentation

## 2017-12-11 DIAGNOSIS — M79605 Pain in left leg: Secondary | ICD-10-CM | POA: Insufficient documentation

## 2017-12-11 DIAGNOSIS — M5442 Lumbago with sciatica, left side: Secondary | ICD-10-CM | POA: Diagnosis not present

## 2017-12-11 DIAGNOSIS — R569 Unspecified convulsions: Secondary | ICD-10-CM | POA: Insufficient documentation

## 2017-12-11 DIAGNOSIS — Z79899 Other long term (current) drug therapy: Secondary | ICD-10-CM | POA: Diagnosis not present

## 2017-12-11 DIAGNOSIS — L659 Nonscarring hair loss, unspecified: Secondary | ICD-10-CM | POA: Insufficient documentation

## 2017-12-11 DIAGNOSIS — F411 Generalized anxiety disorder: Secondary | ICD-10-CM | POA: Insufficient documentation

## 2017-12-11 DIAGNOSIS — M5441 Lumbago with sciatica, right side: Secondary | ICD-10-CM

## 2017-12-11 DIAGNOSIS — J45909 Unspecified asthma, uncomplicated: Secondary | ICD-10-CM | POA: Diagnosis not present

## 2017-12-11 DIAGNOSIS — Z950 Presence of cardiac pacemaker: Secondary | ICD-10-CM | POA: Diagnosis not present

## 2017-12-11 DIAGNOSIS — I10 Essential (primary) hypertension: Secondary | ICD-10-CM | POA: Diagnosis not present

## 2017-12-11 DIAGNOSIS — G4486 Cervicogenic headache: Secondary | ICD-10-CM

## 2017-12-11 DIAGNOSIS — M47816 Spondylosis without myelopathy or radiculopathy, lumbar region: Secondary | ICD-10-CM

## 2017-12-11 DIAGNOSIS — Z79890 Hormone replacement therapy (postmenopausal): Secondary | ICD-10-CM | POA: Insufficient documentation

## 2017-12-11 DIAGNOSIS — M542 Cervicalgia: Secondary | ICD-10-CM | POA: Diagnosis not present

## 2017-12-11 NOTE — Patient Instructions (Signed)
____________________________________________________________________________________________  Preparing for Procedure with Sedation  Instructions: . Oral Intake: Do not eat or drink anything for at least 8 hours prior to your procedure. . Transportation: Public transportation is not allowed. Bring an adult driver. The driver must be physically present in our waiting room before any procedure can be started. . Physical Assistance: Bring an adult physically capable of assisting you, in the event you need help. This adult should keep you company at home for at least 6 hours after the procedure. . Blood Pressure Medicine: Take your blood pressure medicine with a sip of water the morning of the procedure. . Blood thinners: Notify our staff if you are taking any blood thinners. Depending on which one you take, there will be specific instructions on how and when to stop it. . Diabetics on insulin: Notify the staff so that you can be scheduled 1st case in the morning. If your diabetes requires high dose insulin, take only  of your normal insulin dose the morning of the procedure and notify the staff that you have done so. . Preventing infections: Shower with an antibacterial soap the morning of your procedure. . Build-up your immune system: Take 1000 mg of Vitamin C with every meal (3 times a day) the day prior to your procedure. . Antibiotics: Inform the staff if you have a condition or reason that requires you to take antibiotics before dental procedures. . Pregnancy: If you are pregnant, call and cancel the procedure. . Sickness: If you have a cold, fever, or any active infections, call and cancel the procedure. . Arrival: You must be in the facility at least 30 minutes prior to your scheduled procedure. . Children: Do not bring children with you. . Dress appropriately: Bring dark clothing that you would not mind if they get stained. . Valuables: Do not bring any jewelry or valuables.  Procedure  appointments are reserved for interventional treatments only. . No Prescription Refills. . No medication changes will be discussed during procedure appointments. . No disability issues will be discussed.  Reasons to call and reschedule or cancel your procedure: (Following these recommendations will minimize the risk of a serious complication.) . Surgeries: Avoid having procedures within 2 weeks of any surgery. (Avoid for 2 weeks before or after any surgery). . Flu Shots: Avoid having procedures within 2 weeks of a flu shots or . (Avoid for 2 weeks before or after immunizations). . Barium: Avoid having a procedure within 7-10 days after having had a radiological study involving the use of radiological contrast. (Myelograms, Barium swallow or enema study). . Heart attacks: Avoid any elective procedures or surgeries for the initial 6 months after a "Myocardial Infarction" (Heart Attack). . Blood thinners: It is imperative that you stop these medications before procedures. Let us know if you if you take any blood thinner.  . Infection: Avoid procedures during or within two weeks of an infection (including chest colds or gastrointestinal problems). Symptoms associated with infections include: Localized redness, fever, chills, night sweats or profuse sweating, burning sensation when voiding, cough, congestion, stuffiness, runny nose, sore throat, diarrhea, nausea, vomiting, cold or Flu symptoms, recent or current infections. It is specially important if the infection is over the area that we intend to treat. . Heart and lung problems: Symptoms that may suggest an active cardiopulmonary problem include: cough, chest pain, breathing difficulties or shortness of breath, dizziness, ankle swelling, uncontrolled high or unusually low blood pressure, and/or palpitations. If you are experiencing any of these symptoms, cancel   your procedure and contact your primary care physician for an evaluation.  Remember:   Regular Business hours are:  Monday to Thursday 8:00 AM to 4:00 PM  Provider's Schedule: Thurl Boen, MD:  Procedure days: Tuesday and Thursday 7:30 AM to 4:00 PM  Bilal Lateef, MD:  Procedure days: Monday and Wednesday 7:30 AM to 4:00 PM ____________________________________________________________________________________________    

## 2017-12-19 ENCOUNTER — Encounter: Payer: Self-pay | Admitting: Pain Medicine

## 2017-12-19 ENCOUNTER — Ambulatory Visit
Admission: RE | Admit: 2017-12-19 | Discharge: 2017-12-19 | Disposition: A | Discharge status: Home / Self Care | Payer: Medicare Other | Source: Ambulatory Visit | Attending: Pain Medicine | Admitting: Pain Medicine

## 2017-12-19 ENCOUNTER — Ambulatory Visit (HOSPITAL_BASED_OUTPATIENT_CLINIC_OR_DEPARTMENT_OTHER): Payer: Medicare Other | Admitting: Pain Medicine

## 2017-12-19 VITALS — BP 134/80 | HR 98 | Temp 97.6°F | Resp 10 | Ht 62.0 in | Wt 232.0 lb

## 2017-12-19 DIAGNOSIS — Z794 Long term (current) use of insulin: Secondary | ICD-10-CM | POA: Diagnosis not present

## 2017-12-19 DIAGNOSIS — M47817 Spondylosis without myelopathy or radiculopathy, lumbosacral region: Secondary | ICD-10-CM | POA: Insufficient documentation

## 2017-12-19 DIAGNOSIS — Z885 Allergy status to narcotic agent status: Secondary | ICD-10-CM | POA: Insufficient documentation

## 2017-12-19 DIAGNOSIS — M47816 Spondylosis without myelopathy or radiculopathy, lumbar region: Secondary | ICD-10-CM

## 2017-12-19 DIAGNOSIS — Z886 Allergy status to analgesic agent status: Secondary | ICD-10-CM | POA: Insufficient documentation

## 2017-12-19 DIAGNOSIS — Z888 Allergy status to other drugs, medicaments and biological substances status: Secondary | ICD-10-CM | POA: Insufficient documentation

## 2017-12-19 DIAGNOSIS — Z7982 Long term (current) use of aspirin: Secondary | ICD-10-CM | POA: Insufficient documentation

## 2017-12-19 DIAGNOSIS — M47897 Other spondylosis, lumbosacral region: Secondary | ICD-10-CM | POA: Diagnosis not present

## 2017-12-19 DIAGNOSIS — Z79899 Other long term (current) drug therapy: Secondary | ICD-10-CM | POA: Diagnosis not present

## 2017-12-19 DIAGNOSIS — Z950 Presence of cardiac pacemaker: Secondary | ICD-10-CM | POA: Diagnosis not present

## 2017-12-19 DIAGNOSIS — M545 Low back pain, unspecified: Secondary | ICD-10-CM | POA: Insufficient documentation

## 2017-12-19 DIAGNOSIS — Z9049 Acquired absence of other specified parts of digestive tract: Secondary | ICD-10-CM | POA: Insufficient documentation

## 2017-12-19 DIAGNOSIS — G8929 Other chronic pain: Secondary | ICD-10-CM | POA: Insufficient documentation

## 2017-12-19 DIAGNOSIS — Z79890 Hormone replacement therapy (postmenopausal): Secondary | ICD-10-CM | POA: Insufficient documentation

## 2017-12-19 MED ORDER — FENTANYL CITRATE (PF) 100 MCG/2ML IJ SOLN
25.0000 ug | INTRAMUSCULAR | Status: DC | PRN
  Administered 2017-12-19: 100 ug via INTRAVENOUS

## 2017-12-19 MED ORDER — MIDAZOLAM HCL 5 MG/5ML IJ SOLN
INTRAMUSCULAR | Status: AC
  Filled 2017-12-19: qty 5

## 2017-12-19 MED ORDER — FENTANYL CITRATE (PF) 100 MCG/2ML IJ SOLN
INTRAMUSCULAR | Status: AC
  Filled 2017-12-19: qty 2

## 2017-12-19 MED ORDER — TRIAMCINOLONE ACETONIDE 40 MG/ML IJ SUSP
80.0000 mg | Freq: Once | INTRAMUSCULAR | Status: AC
  Administered 2017-12-19: 80 mg

## 2017-12-19 MED ORDER — LACTATED RINGERS IV SOLN
1000.0000 mL | Freq: Once | INTRAVENOUS | Status: AC
  Administered 2017-12-19: 1000 mL via INTRAVENOUS

## 2017-12-19 MED ORDER — ROPIVACAINE HCL 2 MG/ML IJ SOLN
INTRAMUSCULAR | Status: AC
  Filled 2017-12-19: qty 20

## 2017-12-19 MED ORDER — TRIAMCINOLONE ACETONIDE 40 MG/ML IJ SUSP
INTRAMUSCULAR | Status: AC
  Filled 2017-12-19: qty 2

## 2017-12-19 MED ORDER — MIDAZOLAM HCL 5 MG/5ML IJ SOLN
1.0000 mg | INTRAMUSCULAR | Status: DC | PRN
  Administered 2017-12-19: 3 mg via INTRAVENOUS

## 2017-12-19 MED ORDER — LIDOCAINE HCL 2 % IJ SOLN
INTRAMUSCULAR | Status: AC
  Filled 2017-12-19: qty 20

## 2017-12-19 MED ORDER — ROPIVACAINE HCL 2 MG/ML IJ SOLN
18.0000 mL | Freq: Once | INTRAMUSCULAR | Status: AC
  Administered 2017-12-19: 18 mL via PERINEURAL

## 2017-12-19 MED ORDER — LIDOCAINE HCL 2 % IJ SOLN
20.0000 mL | Freq: Once | INTRAMUSCULAR | Status: AC
  Administered 2017-12-19: 400 mg

## 2017-12-19 NOTE — Progress Notes (Signed)
Patient's Name: Carrie Gardner  MRN: [id number]  Referring Provider: Titus Mould*  DOB: Apr 22, 1951  PCP: Titus Mould, NP  DOS: 12/19/2017  Note by: Oswaldo Done, MD  Service setting: Ambulatory outpatient  Specialty: Interventional Pain Management  Patient type: Established  Location: [facility] (AMB) Pain Management Facility  Visit type: Interventional Procedure   Primary Reason for Visit: Interventional Pain Management Treatment. CC: Back Pain (lower bilateral )  Procedure:          Anesthesia, Analgesia, Anxiolysis:  Type: Lumbar Facet, Medial Branch Block(s) #2  Primary Purpose: Diagnostic Region: Posterolateral Lumbosacral Spine Level: L2, L3, L4, L5, & S1 Medial Branch Level(s). Injecting these levels blocks the L3-4, L4-5, and L5-S1 lumbar facet joints. Laterality: Bilateral  Type: Moderate (Conscious) Sedation combined with Local Anesthesia Indication(s): Analgesia and Anxiety Route: Intravenous (IV) IV Access: Secured Sedation: Meaningful verbal contact was maintained at all times during the procedure  Local Anesthetic: Lidocaine 1-2%   Indications: 1. Spondylosis without myelopathy or radiculopathy, lumbosacral region   2. Lumbar Facet Syndrome (Bilateral)   3. Chronic low back pain (Secondary Area of Pain) (Bilateral) (R>L) w/o sciatica    Pain Score: Pre-procedure: 3 /10 Post-procedure: 0-No pain/10  Pre-op Assessment:  Ms. Korell is a 67 y.o. (year old), female patient, seen today for interventional treatment. She  has a past surgical history that includes Spine surgery; Cholecystectomy; Abdominal hysterectomy; Pacemaker insertion; Laparoscopic gastric banding; Laparoscopic gastric band removal with laparoscopic gastric sleeve resection; LEFT HEART CATH AND CORONARY ANGIOGRAPHY (Left, 11/08/2016); and Colonoscopy. Ms. Soung has a current medication list which includes the following prescription(s): aspirin, gnp calcium 1200, dapagliflozin propanediol,  diclofenac sodium, furosemide, glucagon, humalog kwikpen, hydrocodone-acetaminophen, insulin nph-regular human, levothyroxine, lidocaine, linzess, metoprolol tartrate, nortriptyline, omeprazole, vitamin d (ergocalciferol), and omeprazole, and the following Facility-Administered Medications: fentanyl and midazolam. Her primarily concern today is the Back Pain (lower bilateral )  Initial Vital Signs:  Pulse/HCG Rate: 90ECG Heart Rate: 94 Temp: 97.6 F (36.4 C) Resp: 16 BP: (!) 152/136(no BP this morning,  did not think she was supposed to.) SpO2: 100 %  BMI: Estimated body mass index is 42.43 kg/m as calculated from the following:   Height as of this encounter: 5\' 2"  (1.575 m).   Weight as of this encounter: 232 lb (105.2 kg).  Risk Assessment: Allergies: Reviewed. She is allergic to metformin; metformin and related; pregabalin; ibuprofen; iodinated diagnostic agents; lisinopril; mobic [meloxicam]; oxycodone; oxycodone-acetaminophen; and tramadol.  Allergy Precautions: None required Coagulopathies: Reviewed. None identified.  Blood-thinner therapy: None at this time Active Infection(s): Reviewed. None identified. Ms. Oplinger is afebrile  Site Confirmation: Ms. Kizzee was asked to confirm the procedure and laterality before marking the site Procedure checklist: Completed Consent: Before the procedure and under the influence of no sedative(s), amnesic(s), or anxiolytics, the patient was informed of the treatment options, risks and possible complications. To fulfill our ethical and legal obligations, as recommended by the American Medical Association's Code of Ethics, I have informed the patient of my clinical impression; the nature and purpose of the treatment or procedure; the risks, benefits, and possible complications of the intervention; the alternatives, including doing nothing; the risk(s) and benefit(s) of the alternative treatment(s) or procedure(s); and the risk(s) and benefit(s) of doing  nothing. The patient was provided information about the general risks and possible complications associated with the procedure. These may include, but are not limited to: failure to achieve desired goals, infection, bleeding, organ or nerve damage, allergic reactions, paralysis, and  death. In addition, the patient was informed of those risks and complications associated to Spine-related procedures, such as failure to decrease pain; infection (i.e.: Meningitis, epidural or intraspinal abscess); bleeding (i.e.: epidural hematoma, subarachnoid hemorrhage, or any other type of intraspinal or peri-dural bleeding); organ or nerve damage (i.e.: Any type of peripheral nerve, nerve root, or spinal cord injury) with subsequent damage to sensory, motor, and/or autonomic systems, resulting in permanent pain, numbness, and/or weakness of one or several areas of the body; allergic reactions; (i.e.: anaphylactic reaction); and/or death. Furthermore, the patient was informed of those risks and complications associated with the medications. These include, but are not limited to: allergic reactions (i.e.: anaphylactic or anaphylactoid reaction(s)); adrenal axis suppression; blood sugar elevation that in diabetics may result in ketoacidosis or comma; water retention that in patients with history of congestive heart failure may result in shortness of breath, pulmonary edema, and decompensation with resultant heart failure; weight gain; swelling or edema; medication-induced neural toxicity; particulate matter embolism and blood vessel occlusion with resultant organ, and/or nervous system infarction; and/or aseptic necrosis of one or more joints. Finally, the patient was informed that Medicine is not an exact science; therefore, there is also the possibility of unforeseen or unpredictable risks and/or possible complications that may result in a catastrophic outcome. The patient indicated having understood very clearly. We have given  the patient no guarantees and we have made no promises. Enough time was given to the patient to ask questions, all of which were answered to the patient's satisfaction. Ms. Cudmore has indicated that she wanted to continue with the procedure. Attestation: I, the ordering provider, attest that I have discussed with the patient the benefits, risks, side-effects, alternatives, likelihood of achieving goals, and potential problems during recovery for the procedure that I have provided informed consent. Date  Time: 12/19/2017  8:51 AM  Pre-Procedure Preparation:  Monitoring: As per clinic protocol. Respiration, ETCO2, SpO2, BP, heart rate and rhythm monitor placed and checked for adequate function Safety Precautions: Patient was assessed for positional comfort and pressure points before starting the procedure. Time-out: I initiated and conducted the "Time-out" before starting the procedure, as per protocol. The patient was asked to participate by confirming the accuracy of the "Time Out" information. Verification of the correct person, site, and procedure were performed and confirmed by me, the nursing staff, and the patient. "Time-out" conducted as per Joint Commission's Universal Protocol (UP.01.01.01). Time: 1000  Description of Procedure:          Position: Prone Laterality: Bilateral. The procedure was performed in identical fashion on both sides. Levels:  L2, L3, L4, L5, & S1 Medial Branch Level(s) Area Prepped: Posterior Lumbosacral Region Prepping solution: ChloraPrep (2% chlorhexidine gluconate and 70% isopropyl alcohol) Safety Precautions: Aspiration looking for blood return was conducted prior to all injections. At no point did we inject any substances, as a needle was being advanced. Before injecting, the patient was told to immediately notify me if she was experiencing any new onset of "ringing in the ears, or metallic taste in the mouth". No attempts were made at seeking any paresthesias. Safe  injection practices and needle disposal techniques used. Medications properly checked for expiration dates. SDV (single dose vial) medications used. After the completion of the procedure, all disposable equipment used was discarded in the proper designated medical waste containers. Local Anesthesia: Protocol guidelines were followed. The patient was positioned over the fluoroscopy table. The area was prepped in the usual manner. The time-out was completed. The target  area was identified using fluoroscopy. A 12-in long, straight, sterile hemostat was used with fluoroscopic guidance to locate the targets for each level blocked. Once located, the skin was marked with an approved surgical skin marker. Once all sites were marked, the skin (epidermis, dermis, and hypodermis), as well as deeper tissues (fat, connective tissue and muscle) were infiltrated with a small amount of a short-acting local anesthetic, loaded on a 10cc syringe with a 25G, 1.5-in  Needle. An appropriate amount of time was allowed for local anesthetics to take effect before proceeding to the next step. Local Anesthetic: Lidocaine 2.0% The unused portion of the local anesthetic was discarded in the proper designated containers. Technical explanation of process:  L2 Medial Branch Nerve Block (MBB): The target area for the L2 medial branch is at the junction of the postero-lateral aspect of the superior articular process and the superior, posterior, and medial edge of the transverse process of L3. Under fluoroscopic guidance, a Quincke needle was inserted until contact was made with os over the superior postero-lateral aspect of the pedicular shadow (target area). After negative aspiration for blood, 0.5 mL of the nerve block solution was injected without difficulty or complication. The needle was removed intact. L3 Medial Branch Nerve Block (MBB): The target area for the L3 medial branch is at the junction of the postero-lateral aspect of the  superior articular process and the superior, posterior, and medial edge of the transverse process of L4. Under fluoroscopic guidance, a Quincke needle was inserted until contact was made with os over the superior postero-lateral aspect of the pedicular shadow (target area). After negative aspiration for blood, 0.5 mL of the nerve block solution was injected without difficulty or complication. The needle was removed intact. L4 Medial Branch Nerve Block (MBB): The target area for the L4 medial branch is at the junction of the postero-lateral aspect of the superior articular process and the superior, posterior, and medial edge of the transverse process of L5. Under fluoroscopic guidance, a Quincke needle was inserted until contact was made with os over the superior postero-lateral aspect of the pedicular shadow (target area). After negative aspiration for blood, 0.5 mL of the nerve block solution was injected without difficulty or complication. The needle was removed intact. L5 Medial Branch Nerve Block (MBB): The target area for the L5 medial branch is at the junction of the postero-lateral aspect of the superior articular process and the superior, posterior, and medial edge of the sacral ala. Under fluoroscopic guidance, a Quincke needle was inserted until contact was made with os over the superior postero-lateral aspect of the pedicular shadow (target area). After negative aspiration for blood, 0.5 mL of the nerve block solution was injected without difficulty or complication. The needle was removed intact. S1 Medial Branch Nerve Block (MBB): The target area for the S1 medial branch is at the posterior and inferior 6 o'clock position of the L5-S1 facet joint. Under fluoroscopic guidance, the Quincke needle inserted for the L5 MBB was redirected until contact was made with os over the inferior and postero aspect of the sacrum, at the 6 o' clock position under the L5-S1 facet joint (Target area). After negative  aspiration for blood, 0.5 mL of the nerve block solution was injected without difficulty or complication. The needle was removed intact. Procedural Needles: 22-gauge, 3.5-inch, Quincke needles used for all levels. Nerve block solution: 0.2% PF-Ropivacaine + Triamcinolone (40 mg/mL) diluted to a final concentration of 4 mg of Triamcinolone/mL of Ropivacaine The unused portion of  the solution was discarded in the proper designated containers.  Once the entire procedure was completed, the treated area was cleaned, making sure to leave some of the prepping solution back to take advantage of its long term bactericidal properties.   Illustration of the posterior view of the lumbar spine and the posterior neural structures. Laminae of L2 through S1 are labeled. DPRL5, dorsal primary ramus of L5; DPRS1, dorsal primary ramus of S1; DPR3, dorsal primary ramus of L3; FJ, facet (zygapophyseal) joint L3-L4; I, inferior articular process of L4; LB1, lateral branch of dorsal primary ramus of L1; IAB, inferior articular branches from L3 medial branch (supplies L4-L5 facet joint); IBP, intermediate branch plexus; MB3, medial branch of dorsal primary ramus of L3; NR3, third lumbar nerve root; S, superior articular process of L5; SAB, superior articular branches from L4 (supplies L4-5 facet joint also); TP3, transverse process of L3.  Vitals:   12/19/17 1005 12/19/17 1010 12/19/17 1015 12/19/17 1022  BP: (!) 150/97 120/88 126/89 121/66  Pulse: 91 99 98   Resp: 16 17 16 14   Temp:      TempSrc:      SpO2: 94% 99% 98% 95%  Weight:      Height:        Start Time: 1000 hrs. End Time: 1013 hrs.  Imaging Guidance (Spinal):          Type of Imaging Technique: Fluoroscopy Guidance (Spinal) Indication(s): Assistance in needle guidance and placement for procedures requiring needle placement in or near specific anatomical locations not easily accessible without such assistance. Exposure Time: Please see nurses  notes. Contrast: None used. Fluoroscopic Guidance: I was personally present during the use of fluoroscopy. "Tunnel Vision Technique" used to obtain the best possible view of the target area. Parallax error corrected before commencing the procedure. "Direction-depth-direction" technique used to introduce the needle under continuous pulsed fluoroscopy. Once target was reached, antero-posterior, oblique, and lateral fluoroscopic projection used confirm needle placement in all planes. Images permanently stored in EMR. Interpretation: No contrast injected. I personally interpreted the imaging intraoperatively. Adequate needle placement confirmed in multiple planes. Permanent images saved into the patient's record.  Antibiotic Prophylaxis:   Anti-infectives (From admission, onward)   None     Indication(s): None identified  Post-operative Assessment:  Post-procedure Vital Signs:  Pulse/HCG Rate: 9894 Temp: 97.6 F (36.4 C) Resp: 14 BP: 121/66 SpO2: 95 %  EBL: None  Complications: No immediate post-treatment complications observed by team, or reported by patient.  Note: The patient tolerated the entire procedure well. A repeat set of vitals were taken after the procedure and the patient was kept under observation following institutional policy, for this type of procedure. Post-procedural neurological assessment was performed, showing return to baseline, prior to discharge. The patient was provided with post-procedure discharge instructions, including a section on how to identify potential problems. Should any problems arise concerning this procedure, the patient was given instructions to immediately contact , at any time, without hesitation. In any case, we plan to contact the patient by telephone for a follow-up status report regarding this interventional procedure.  Comments:  No additional relevant information.  Plan of Care    Imaging Orders     DG C-Arm 1-60 Min-No  Report  Procedure Orders     LUMBAR FACET(MEDIAL BRANCH NERVE BLOCK) MBNB  Medications ordered for procedure: Meds ordered this encounter  Medications  . lidocaine (XYLOCAINE) 2 % (with pres) injection 400 mg  . midazolam (VERSED) 5 MG/5ML injection 1-2 mg  Make sure Flumazenil is available in the pyxis when using this medication. If oversedation occurs, administer 0.2 mg IV over 15 sec. If after 45 sec no response, administer 0.2 mg again over 1 min; may repeat at 1 min intervals; not to exceed 4 doses (1 mg)  . fentaNYL (SUBLIMAZE) injection 25-50 mcg    Make sure Narcan is available in the pyxis when using this medication. In the event of respiratory depression (RR< 8/min): Titrate NARCAN (naloxone) in increments of 0.1 to 0.2 mg IV at 2-3 minute intervals, until desired degree of reversal.  . lactated ringers infusion 1,000 mL  . ropivacaine (PF) 2 mg/mL (0.2%) (NAROPIN) injection 18 mL  . triamcinolone acetonide (KENALOG-40) injection 80 mg   Medications administered: We administered lidocaine, midazolam, fentaNYL, lactated ringers, ropivacaine (PF) 2 mg/mL (0.2%), and triamcinolone acetonide.  See the medical record for exact dosing, route, and time of administration.  New Prescriptions   No medications on file   Disposition: Discharge home  Discharge Date & Time: 12/19/2017; 1045 hrs.   Physician-requested Follow-up: Return for post-procedure eval (2 wks).  Future Appointments  Date Time Provider Department Center  01/13/2018  2:00 PM Delano Metz, MD [facility] None   Primary Care Physician: Titus Mould, NP Location: [facility] Note by: Oswaldo Done, MD Date: 12/19/2017; Time: 10:33 AM  Disclaimer:  Medicine is not an exact science. The only guarantee in medicine is that nothing is guaranteed. It is important to note that the decision to proceed with this intervention was based on the information collected from the  patient. The Data and conclusions were drawn from the patient's questionnaire, the interview, and the physical examination. Because the information was provided in large part by the patient, it cannot be guaranteed that it has not been purposely or unconsciously manipulated. Every effort has been made to obtain as much relevant data as possible for this evaluation. It is important to note that the conclusions that lead to this procedure are derived in large part from the available data. Always take into account that the treatment will also be dependent on availability of resources and existing treatment guidelines, considered by other Pain Management Practitioners as being common knowledge and practice, at the time of the intervention. For Medico-Legal purposes, it is also important to point out that variation in procedural techniques and pharmacological choices are the acceptable norm. The indications, contraindications, technique, and results of the above procedure should only be interpreted and judged by a Board-Certified Interventional Pain Specialist with extensive familiarity and expertise in the same exact procedure and technique.

## 2017-12-19 NOTE — Patient Instructions (Signed)

## 2017-12-19 NOTE — Progress Notes (Signed)
Safety precautions to be maintained throughout the outpatient stay will include: orient to surroundings, keep bed in low position, maintain call bell within reach at all times, provide assistance with transfer out of bed and ambulation.   Patient reports FBS 266.  Reported to MD orders for her to take her insulin now 12u given to patient by patient of [facility].

## 2017-12-20 ENCOUNTER — Telehealth: Payer: Self-pay

## 2017-12-20 NOTE — Telephone Encounter (Signed)
Post procedure phone call.  Unable to get line to go through.

## 2017-12-31 ENCOUNTER — Emergency Department: Payer: Medicare Other

## 2017-12-31 ENCOUNTER — Other Ambulatory Visit: Payer: Self-pay

## 2017-12-31 ENCOUNTER — Observation Stay
Admission: EM | Admit: 2017-12-31 | Discharge: 2018-01-01 | Disposition: A | Discharge status: Home / Self Care | Payer: Medicare Other | Attending: Internal Medicine | Admitting: Internal Medicine

## 2017-12-31 ENCOUNTER — Encounter: Payer: Self-pay | Admitting: *Deleted

## 2017-12-31 ENCOUNTER — Observation Stay
Admit: 2017-12-31 | Discharge: 2017-12-31 | Disposition: A | Discharge status: Home / Self Care | Payer: Medicare Other | Attending: Specialist | Admitting: Specialist

## 2017-12-31 DIAGNOSIS — Z794 Long term (current) use of insulin: Secondary | ICD-10-CM | POA: Insufficient documentation

## 2017-12-31 DIAGNOSIS — E039 Hypothyroidism, unspecified: Secondary | ICD-10-CM | POA: Insufficient documentation

## 2017-12-31 DIAGNOSIS — Z8673 Personal history of transient ischemic attack (TIA), and cerebral infarction without residual deficits: Secondary | ICD-10-CM | POA: Diagnosis not present

## 2017-12-31 DIAGNOSIS — Z950 Presence of cardiac pacemaker: Secondary | ICD-10-CM | POA: Diagnosis not present

## 2017-12-31 DIAGNOSIS — I129 Hypertensive chronic kidney disease with stage 1 through stage 4 chronic kidney disease, or unspecified chronic kidney disease: Secondary | ICD-10-CM | POA: Insufficient documentation

## 2017-12-31 DIAGNOSIS — R531 Weakness: Secondary | ICD-10-CM

## 2017-12-31 DIAGNOSIS — E1122 Type 2 diabetes mellitus with diabetic chronic kidney disease: Secondary | ICD-10-CM | POA: Insufficient documentation

## 2017-12-31 DIAGNOSIS — Z9884 Bariatric surgery status: Secondary | ICD-10-CM | POA: Insufficient documentation

## 2017-12-31 DIAGNOSIS — N189 Chronic kidney disease, unspecified: Secondary | ICD-10-CM | POA: Insufficient documentation

## 2017-12-31 DIAGNOSIS — R29810 Facial weakness: Secondary | ICD-10-CM | POA: Diagnosis not present

## 2017-12-31 DIAGNOSIS — E162 Hypoglycemia, unspecified: Secondary | ICD-10-CM

## 2017-12-31 DIAGNOSIS — Z7982 Long term (current) use of aspirin: Secondary | ICD-10-CM | POA: Insufficient documentation

## 2017-12-31 DIAGNOSIS — M797 Fibromyalgia: Secondary | ICD-10-CM | POA: Diagnosis not present

## 2017-12-31 DIAGNOSIS — I2510 Atherosclerotic heart disease of native coronary artery without angina pectoris: Secondary | ICD-10-CM | POA: Diagnosis not present

## 2017-12-31 DIAGNOSIS — E11649 Type 2 diabetes mellitus with hypoglycemia without coma: Secondary | ICD-10-CM | POA: Diagnosis not present

## 2017-12-31 DIAGNOSIS — E1142 Type 2 diabetes mellitus with diabetic polyneuropathy: Secondary | ICD-10-CM | POA: Diagnosis not present

## 2017-12-31 DIAGNOSIS — K219 Gastro-esophageal reflux disease without esophagitis: Secondary | ICD-10-CM | POA: Insufficient documentation

## 2017-12-31 DIAGNOSIS — Z79890 Hormone replacement therapy (postmenopausal): Secondary | ICD-10-CM | POA: Diagnosis not present

## 2017-12-31 DIAGNOSIS — Z79899 Other long term (current) drug therapy: Secondary | ICD-10-CM | POA: Diagnosis not present

## 2017-12-31 DIAGNOSIS — R4182 Altered mental status, unspecified: Secondary | ICD-10-CM | POA: Diagnosis present

## 2017-12-31 DIAGNOSIS — J449 Chronic obstructive pulmonary disease, unspecified: Secondary | ICD-10-CM | POA: Diagnosis not present

## 2017-12-31 DIAGNOSIS — E118 Type 2 diabetes mellitus with unspecified complications: Secondary | ICD-10-CM

## 2017-12-31 DIAGNOSIS — I639 Cerebral infarction, unspecified: Secondary | ICD-10-CM

## 2017-12-31 LAB — CBC WITH DIFFERENTIAL/PLATELET
BASOS PCT: 1 %
Basophils Absolute: 0 10*3/uL (ref 0–0.1)
EOS ABS: 0 10*3/uL (ref 0–0.7)
Eosinophils Relative: 1 %
HEMATOCRIT: 46 % (ref 35.0–47.0)
HEMOGLOBIN: 15 g/dL (ref 12.0–16.0)
Lymphocytes Relative: 19 %
Lymphs Abs: 1.3 10*3/uL (ref 1.0–3.6)
MCH: 27.9 pg (ref 26.0–34.0)
MCHC: 32.6 g/dL (ref 32.0–36.0)
MCV: 85.7 fL (ref 80.0–100.0)
MONO ABS: 0.3 10*3/uL (ref 0.2–0.9)
MONOS PCT: 5 %
NEUTROS PCT: 74 %
Neutro Abs: 5.1 10*3/uL (ref 1.4–6.5)
Platelets: 189 10*3/uL (ref 150–440)
RBC: 5.37 MIL/uL — ABNORMAL HIGH (ref 3.80–5.20)
RDW: 14.4 % (ref 11.5–14.5)
WBC: 6.8 10*3/uL (ref 3.6–11.0)

## 2017-12-31 LAB — URINE DRUG SCREEN, QUALITATIVE (ARMC ONLY)
Amphetamines, Ur Screen: NOT DETECTED
BARBITURATES, UR SCREEN: NOT DETECTED
CANNABINOID 50 NG, UR ~~LOC~~: NOT DETECTED
COCAINE METABOLITE, UR ~~LOC~~: NOT DETECTED
MDMA (ECSTASY) UR SCREEN: NOT DETECTED
Methadone Scn, Ur: NOT DETECTED
OPIATE, UR SCREEN: NOT DETECTED
Phencyclidine (PCP) Ur S: NOT DETECTED
TRICYCLIC, UR SCREEN: POSITIVE — AB

## 2017-12-31 LAB — COMPREHENSIVE METABOLIC PANEL
ALBUMIN: 3.1 g/dL — AB (ref 3.5–5.0)
ALK PHOS: 62 U/L (ref 38–126)
ALT: 80 U/L — AB (ref 0–44)
AST: 35 U/L (ref 15–41)
Anion gap: 7 (ref 5–15)
BUN: 23 mg/dL (ref 8–23)
CALCIUM: 9.2 mg/dL (ref 8.9–10.3)
CO2: 26 mmol/L (ref 22–32)
CREATININE: 0.99 mg/dL (ref 0.44–1.00)
Chloride: 107 mmol/L (ref 98–111)
GFR calc Af Amer: >60 mL/min (ref >60)
GFR calc non Af Amer: 58 mL/min — ABNORMAL LOW (ref >60)
GLUCOSE: 121 mg/dL — AB (ref 70–99)
Potassium: 3.9 mmol/L (ref 3.5–5.1)
SODIUM: 140 mmol/L (ref 135–145)
Total Bilirubin: 0.3 mg/dL (ref 0.3–1.2)
Total Protein: 6.3 g/dL — ABNORMAL LOW (ref 6.5–8.1)

## 2017-12-31 LAB — GLUCOSE, CAPILLARY
GLUCOSE-CAPILLARY: 291 mg/dL — AB (ref 70–99)
Glucose-Capillary: 74 mg/dL (ref 70–99)

## 2017-12-31 LAB — APTT

## 2017-12-31 LAB — PROTIME-INR
INR: 0.89
Prothrombin Time: 12 seconds (ref 11.4–15.2)

## 2017-12-31 LAB — ETHANOL: Alcohol, Ethyl (B): <10 mg/dL (ref <10)

## 2017-12-31 LAB — TROPONIN I: Troponin I: <0.03 ng/mL (ref <0.03)

## 2017-12-31 MED ORDER — ENOXAPARIN SODIUM 40 MG/0.4ML ~~LOC~~ SOLN
40.0000 mg | Freq: Two times a day (BID) | SUBCUTANEOUS | Status: DC
  Administered 2017-12-31 – 2018-01-01 (×2): 40 mg via SUBCUTANEOUS
  Filled 2017-12-31 (×2): qty 0.4

## 2017-12-31 MED ORDER — ONDANSETRON HCL 4 MG/2ML IJ SOLN: 4.0000 mg | Freq: Four times a day (QID) | INTRAMUSCULAR | Status: DC | PRN

## 2017-12-31 MED ORDER — ACETAMINOPHEN 650 MG RE SUPP: 650.0000 mg | Freq: Four times a day (QID) | RECTAL | Status: DC | PRN

## 2017-12-31 MED ORDER — LIDOCAINE 5 % EX OINT
1.0000 "application " | TOPICAL_OINTMENT | Freq: Four times a day (QID) | CUTANEOUS | Status: DC | PRN
  Filled 2017-12-31: qty 35.44

## 2017-12-31 MED ORDER — INSULIN ASPART 100 UNIT/ML ~~LOC~~ SOLN
0.0000 [IU] | Freq: Three times a day (TID) | SUBCUTANEOUS | Status: DC
  Administered 2018-01-01: 5 [IU] via SUBCUTANEOUS
  Administered 2018-01-01: 13:00:00 3 [IU] via SUBCUTANEOUS
  Filled 2017-12-31 (×2): qty 1

## 2017-12-31 MED ORDER — METOPROLOL TARTRATE 25 MG PO TABS
25.0000 mg | ORAL_TABLET | Freq: Two times a day (BID) | ORAL | Status: DC
  Administered 2017-12-31 – 2018-01-01 (×2): 25 mg via ORAL
  Filled 2017-12-31 (×2): qty 1

## 2017-12-31 MED ORDER — ASPIRIN EC 81 MG PO TBEC
81.0000 mg | DELAYED_RELEASE_TABLET | Freq: Every day | ORAL | Status: DC
  Administered 2018-01-01: 11:00:00 81 mg via ORAL
  Filled 2017-12-31: qty 1

## 2017-12-31 MED ORDER — CALCIUM CITRATE-VITAMIN D 500-500 MG-UNIT PO CHEW
2.0000 | CHEWABLE_TABLET | Freq: Every day | ORAL | Status: DC
  Administered 2018-01-01: 08:00:00 2 via ORAL
  Filled 2017-12-31: qty 2

## 2017-12-31 MED ORDER — PANTOPRAZOLE SODIUM 40 MG PO TBEC
40.0000 mg | DELAYED_RELEASE_TABLET | Freq: Every day | ORAL | Status: DC
  Administered 2018-01-01: 40 mg via ORAL
  Filled 2017-12-31: qty 1

## 2017-12-31 MED ORDER — LEVOTHYROXINE SODIUM 50 MCG PO TABS
125.0000 ug | ORAL_TABLET | Freq: Every day | ORAL | Status: DC
  Administered 2018-01-01: 125 ug via ORAL
  Filled 2017-12-31: qty 3

## 2017-12-31 MED ORDER — PRAVASTATIN SODIUM 40 MG PO TABS
40.0000 mg | ORAL_TABLET | Freq: Every day | ORAL | Status: DC
  Administered 2017-12-31: 19:00:00 40 mg via ORAL
  Filled 2017-12-31: qty 1
  Filled 2017-12-31: qty 2
  Filled 2017-12-31: qty 1

## 2017-12-31 MED ORDER — CANAGLIFLOZIN 100 MG PO TABS: 100.0000 mg | ORAL_TABLET | Freq: Every day | ORAL | Status: DC

## 2017-12-31 MED ORDER — ONDANSETRON HCL 4 MG PO TABS: 4.0000 mg | ORAL_TABLET | Freq: Four times a day (QID) | ORAL | Status: DC | PRN

## 2017-12-31 MED ORDER — NORTRIPTYLINE HCL 10 MG PO CAPS
20.0000 mg | ORAL_CAPSULE | Freq: Every day | ORAL | Status: DC
  Administered 2017-12-31: 20 mg via ORAL
  Filled 2017-12-31 (×2): qty 2

## 2017-12-31 MED ORDER — HYDROCODONE-ACETAMINOPHEN 5-325 MG PO TABS
1.0000 | ORAL_TABLET | Freq: Two times a day (BID) | ORAL | Status: DC | PRN
  Administered 2017-12-31 – 2018-01-01 (×2): 1 via ORAL
  Filled 2017-12-31 (×2): qty 1

## 2017-12-31 MED ORDER — FUROSEMIDE 20 MG PO TABS
20.0000 mg | ORAL_TABLET | Freq: Every day | ORAL | Status: DC
  Administered 2018-01-01: 20 mg via ORAL
  Filled 2017-12-31: qty 1

## 2017-12-31 MED ORDER — LINACLOTIDE 145 MCG PO CAPS
145.0000 ug | ORAL_CAPSULE | Freq: Every day | ORAL | Status: DC
  Administered 2018-01-01: 145 ug via ORAL
  Filled 2017-12-31: qty 1

## 2017-12-31 MED ORDER — ACETAMINOPHEN 325 MG PO TABS: 650.0000 mg | ORAL_TABLET | Freq: Four times a day (QID) | ORAL | Status: DC | PRN

## 2017-12-31 MED ORDER — INSULIN ASPART 100 UNIT/ML ~~LOC~~ SOLN
0.0000 [IU] | Freq: Every day | SUBCUTANEOUS | Status: DC
  Administered 2017-12-31: 22:00:00 3 [IU] via SUBCUTANEOUS
  Filled 2017-12-31: qty 1

## 2017-12-31 MED ORDER — VITAMIN D 1000 UNITS PO TABS
1000.0000 [IU] | ORAL_TABLET | Freq: Every day | ORAL | Status: DC
  Administered 2018-01-01: 1000 [IU] via ORAL
  Filled 2017-12-31: qty 1

## 2017-12-31 MED ORDER — ASPIRIN 81 MG PO CHEW
324.0000 mg | CHEWABLE_TABLET | Freq: Once | ORAL | Status: AC
  Administered 2017-12-31: 324 mg via ORAL
  Filled 2017-12-31: qty 4

## 2017-12-31 MED ORDER — DICLOFENAC SODIUM 1 % TD GEL
4.0000 g | Freq: Four times a day (QID) | TRANSDERMAL | Status: DC | PRN
  Filled 2017-12-31: qty 100

## 2017-12-31 NOTE — ED Notes (Signed)
Charge RN tried IV attempt x1. IV team is now at bedside.

## 2017-12-31 NOTE — ED Notes (Signed)
Patient transported to room 102 

## 2017-12-31 NOTE — ED Notes (Signed)
Report phoned to [facility] pt to be admitted to room 102 A

## 2017-12-31 NOTE — Progress Notes (Signed)
*  PRELIMINARY RESULTS* Echocardiogram 2D Echocardiogram has been performed.  Carrie Gardner Carrie Gardner 12/31/2017, 9:13 PM

## 2017-12-31 NOTE — ED Notes (Signed)
IV team was not able to obtain IV access. MD aware.

## 2017-12-31 NOTE — H&P (Signed)
[employer] - [place] [facility]   PATIENT NAME: Carrie Gardner    MR#:  [id number]  DATE OF BIRTH:  [birth year]  DATE OF ADMISSION:  12/31/2017  PRIMARY CARE PHYSICIAN: Titus Mould, NP   REQUESTING/REFERRING PHYSICIAN: Dr. Sharman Cheek  CHIEF COMPLAINT:   Chief Complaint  Patient presents with  . Altered Mental Status    HISTORY OF PRESENT ILLNESS:  Carrie Gardner  is a 67 y.o. female with a known history of hypertension, hyperlipidemia, hypothyroidism, status post pacemaker, history of seizures, history of previous TIA, diabetes, COPD who presents to the hospital due to altered mental status, left-sided facial droop and weakness.  Patient was in her usual state of health when her home health nurse noticed this morning that her blood pressure was somewhat high, her blood sugars were low and she was more confused than normal.  She called EMS and patient was brought to the hospital.  Patient was noted to be somewhat hypoglycemic with blood sugars in the 60s, her blood sugars have been corrected now in the ER but she continues to have some left upper extremity weakness.  Patient CT head initially was negative for stroke.  Given her persistent left-sided weakness with some mild left-sided facial droop hospitalist services were contacted for admission for work-up of a stroke.  Patient denies any headache, nausea, vomiting, chest pain, numbness, tingling or any other associated symptoms presently.  PAST MEDICAL HISTORY:   Past Medical History:  Diagnosis Date  . Allergy   . Arthritis   . Bradycardia   . Cataract   . Chronic kidney disease   . COPD (chronic obstructive pulmonary disease) (HCC)   . Diabetes mellitus without complication (HCC)   . Fibromyalgia   . Generalized anxiety disorder 02/09/2009  . Hyperlipidemia 10/20/2006  . Hypertension   . Hypothyroidism   . Opiate use 06/03/2015  . Presence of permanent cardiac pacemaker   . Sciatica   .  Seizures (HCC)   . Sleep apnea   . Thyroid disease   . TIA (transient ischemic attack)   . Vitiligo     PAST SURGICAL HISTORY:   Past Surgical History:  Procedure Laterality Date  . ABDOMINAL HYSTERECTOMY    . CHOLECYSTECTOMY    . COLONOSCOPY    . LAPAROSCOPIC GASTRIC BAND REMOVAL WITH LAPAROSCOPIC GASTRIC SLEEVE RESECTION    . LAPAROSCOPIC GASTRIC BANDING    . LEFT HEART CATH AND CORONARY ANGIOGRAPHY Left 11/08/2016   Procedure: Left Heart Cath and Coronary Angiography;  Surgeon: Marcina Millard, MD;  Location: [facility] INVASIVE CV LAB;  Service: Cardiovascular;  Laterality: Left;  . PACEMAKER INSERTION    . SPINE SURGERY      SOCIAL HISTORY:   Social History   Tobacco Use  . Smoking status: Never Smoker  . Smokeless tobacco: Never Used  Substance Use Topics  . Alcohol use: Yes    Alcohol/week: 3.0 standard drinks    Types: 3 Glasses of wine per week    FAMILY HISTORY:   Family History  Problem Relation Age of Onset  . Arthritis Mother   . Cancer Mother   . COPD Mother   . Diabetes Mother   . Vision loss Mother   . Heart disease Mother   . Hypertension Mother   . Kidney disease Mother   . Arthritis Father   . Asthma Father   . Depression Father   . Drug abuse Father   . Vision loss Father   . Hypertension Father   .  Stroke Father     DRUG ALLERGIES:   Allergies  Allergen Reactions  . Metformin Anaphylaxis  . Metformin And Related Anaphylaxis  . Pregabalin Anaphylaxis  . Ibuprofen Other (See Comments)    Other reaction(s): Other (See Comments) Kidney disease Kidney issues Kidney disease  . Iodinated Diagnostic Agents Other (See Comments)    Other reaction(s): Other (See Comments) Patient reports "I can't have dye r/t my kidney problems." Kidney disease  . Lisinopril Other (See Comments)    Low back pain  . Mobic [Meloxicam] Other (See Comments)    Causes stomach cramps.  Patient states she is unable to tolerate.   . Oxycodone Rash  .  Oxycodone-Acetaminophen Rash  . Tramadol Other (See Comments) and Itching    Hallucinations hallucinations    REVIEW OF SYSTEMS:   Review of Systems  Constitutional: Negative for chills, fever and weight loss.  HENT: Negative for congestion, nosebleeds and tinnitus.   Eyes: Negative for blurred vision, double vision and redness.  Respiratory: Negative for cough, hemoptysis, shortness of breath and wheezing.   Cardiovascular: Negative for chest pain, orthopnea, leg swelling and PND.  Gastrointestinal: Negative for abdominal pain, diarrhea, melena, nausea and vomiting.  Genitourinary: Negative for dysuria, hematuria and urgency.  Musculoskeletal: Negative for falls and joint pain.  Neurological: Positive for weakness (Left sided weakness). Negative for dizziness, tingling, sensory change, focal weakness, seizures and headaches.  Endo/Heme/Allergies: Negative for polydipsia. Does not bruise/bleed easily.  Psychiatric/Behavioral: Negative for depression and memory loss. The patient is not nervous/anxious.   All other systems reviewed and are negative.   MEDICATIONS AT HOME:   Prior to Admission medications   Medication Sig Start Date End Date Taking? Authorizing Provider  aspirin 81 MG tablet Take 81 mg by mouth daily.   Yes [provider]  Aspirin-Salicylamide-Caffeine (ARTHRITIS STRENGTH BC POWDER PO) Take 1 packet by mouth at bedtime.   Yes [provider]  Calcium Carbonate-Vit D-Min (GNP CALCIUM 1200) 1200-1000 MG-UNIT CHEW Chew 1,200 mg by mouth daily with breakfast. Take in combination with vitamin D and magnesium. 12/02/17 05/31/18 Yes Barbette Merino, NP  Cholecalciferol (D3-1000) 1000 units tablet Take 1,000 Units by mouth daily.   Yes [provider]  dapagliflozin propanediol (FARXIGA) 10 MG TABS tablet Take 10 mg by mouth daily.   Yes [provider]  diclofenac sodium (VOLTAREN) 1 % GEL Apply 4 g topically 4 (four) times daily. 12/02/17  05/31/18 Yes King, Shana Chute, NP  furosemide (LASIX) 20 MG tablet Take 20 mg by mouth daily.   Yes [provider]  glucagon (GLUCAGEN) 1 MG SOLR injection Inject 1 mg into the vein once as needed for low blood sugar.   Yes [provider]  HUMALOG KWIKPEN 100 UNIT/ML KiwkPen Inject 12 Units into the skin daily.  12/10/17  Yes [provider]  HYDROcodone-acetaminophen (NORCO/VICODIN) 5-325 MG tablet Take 1 tablet by mouth 2 (two) times daily as needed for severe pain. 12/04/17 01/03/18 Yes King, Shana Chute, NP  levothyroxine (SYNTHROID, LEVOTHROID) 125 MCG tablet Take 125 mcg by mouth daily before breakfast.    Yes [provider]  lidocaine (XYLOCAINE) 5 % ointment Apply 1 application topically 4 (four) times daily as needed for moderate pain. 12/02/17 03/02/18 Yes King, Shana Chute, NP  LINZESS 145 MCG CAPS capsule Take 145 mcg by mouth daily. 10/08/17  Yes [provider]  metoprolol tartrate (LOPRESSOR) 25 MG tablet Take 25 mg by mouth 2 (two) times daily. 10/08/17  Yes  [provider]  nortriptyline (PAMELOR) 10 MG capsule Take 20 mg by mouth at bedtime.    Yes [provider]  omeprazole (PRILOSEC) 40 MG capsule Take 40 mg by mouth daily. 09/14/16 01/01/19 Yes [provider]      VITAL SIGNS:  Blood pressure (!) 150/106, pulse 71, temperature (!) 97.5 F (36.4 C), temperature source Oral, resp. rate 16, height 5\' 2"  (1.575 m), weight 105.7 kg, SpO2 100 %.  PHYSICAL EXAMINATION:  Physical Exam  GENERAL:  67 y.o.-year-old patient lying in the bed in no acute distress.  EYES: Pupils equal, round, reactive to light and accommodation. No scleral icterus. Extraocular muscles intact.  HEENT: Head atraumatic, normocephalic. Oropharynx and nasopharynx clear. No oropharyngeal erythema, moist oral mucosa  NECK:  Supple, no jugular venous distention. No thyroid enlargement, no tenderness.  LUNGS: Normal breath sounds bilaterally, no  wheezing, rales, rhonchi. No use of accessory muscles of respiration.  CARDIOVASCULAR: S1, S2 RRR. No murmurs, rubs, gallops, clicks.  ABDOMEN: Soft, nontender, nondistended. Bowel sounds present. No organomegaly or mass.  EXTREMITIES: No pedal edema, cyanosis, or clubbing. + 2 pedal & radial pulses b/l.   NEUROLOGIC: Cranial nerves II through XII are intact.  4 out of 5 strength in left upper extremity as compared to the right.  No other focal motor or sensory deficits appreciated bilaterally. PSYCHIATRIC: The patient is alert and oriented x 3. Good affect.  SKIN: No obvious rash, lesion, or ulcer.   LABORATORY PANEL:   CBC No results for input(s): WBC, HGB, HCT, PLT in the last 168 hours. ------------------------------------------------------------------------------------------------------------------  Chemistries  Recent Labs  Lab 12/31/17 1251  NA 140  K 3.9  CL 107  CO2 26  GLUCOSE 121*  BUN 23  CREATININE 0.99  CALCIUM 9.2  AST 35  ALT 80*  ALKPHOS 62  BILITOT 0.3   ------------------------------------------------------------------------------------------------------------------  Cardiac Enzymes Recent Labs  Lab 12/31/17 1251  TROPONINI <0.03   ------------------------------------------------------------------------------------------------------------------  RADIOLOGY:  Dg Chest 1 View  Result Date: 12/31/2017 CLINICAL DATA:  Acute onset of LEFT-sided weakness that began this morning. Acute hypertension. Hypoglycemia. EXAM: Portable CHEST 1 VIEW COMPARISON:  09/12/2016, 05/19/2011 and earlier. FINDINGS: Cardiac silhouette normal in size, unchanged. LEFT subclavian dual lead transvenous pacemaker unchanged. Suboptimal inspiration which accounts for crowded bronchovascular markings at the bases. Taking this into account, lung parenchyma clear. Normal pulmonary vascularity. No pleural effusions. IMPRESSION: Suboptimal inspiration.  No acute cardiopulmonary disease.  Electronically Signed   By: Hulan Saas M.D.   On: 12/31/2017 13:24   Ct Head Wo Contrast  Result Date: 12/31/2017 CLINICAL DATA:  Headache with hypertension. Hypoglycemia with confusion. EXAM: CT HEAD WITHOUT CONTRAST TECHNIQUE: Contiguous axial images were obtained from the base of the skull through the vertex without intravenous contrast. COMPARISON:  December 19, 2009 FINDINGS: Brain: The ventricles are normal in size and configuration. There is no intracranial mass, hemorrhage, extra-axial fluid collection, or midline shift. The gray-white compartments are within normal limits. No evident acute infarct. Vascular: No appreciable hyperdense vessel. There are foci of calcification in each carotid siphon. Skull: Bony calvarium appears intact. Areas of calcification along the falx are stable and benign in appearance. Sinuses/Orbits: There is mucosal thickening in several ethmoid air cells. Other visualized paranasal sinuses are clear. Visualized orbits appear symmetric bilaterally. Other: Visualized mastoid air cells are clear. IMPRESSION: No mass or hemorrhage.  Gray-white compartments appear normal. Foci of arterial vascular calcification noted. Mucosal thickening noted in several ethmoid air cells. Electronically Signed  By: Bretta Bang III M.D.   On: 12/31/2017 13:39     IMPRESSION AND PLAN:   67 year old female with past medical history of fibromyalgia, chronic back pain, hypertension, hyperlipidemia, hypothyroidism, COPD who presents to the hospital due to altered mental status, left-sided facial droop and left-sided weakness.  1.  Left-sided weakness/facial droop- suspicious for possible TIA/CVA.  Patient CT head initially in the ER is negative for acute stroke. - Her facial droop has resolved but she continues to have some mild left upper extremity weakness. - We will continue aspirin, start her on some Pravachol, will get carotid duplex, echocardiogram, also get a neurology consult  and consider repeating CT scan of the head tomorrow but will leave that to the discretion of the neurologist.  2.  Diabetes type 2 without complication-patient had some mild hypoglycemia prior to coming to the hospital. -I will hold her schedule insulin, placed on sliding scale insulin for now.  3.  Hypothyroidism-continue Synthroid  4.  Essential hypertension-continue metoprolol tartrate.  5.  Fibromyalgia-continue nortriptyline.  6.  GERD-continue Protonix    All the records are reviewed and case discussed with ED provider. Management plans discussed with the patient, family and they are in agreement.  CODE STATUS: Full code  TOTAL TIME TAKING CARE OF THIS PATIENT: 45 minutes.    Houston Siren M.D on 12/31/2017 at 2:53 PM  Between 7am to 6pm - Pager - [phone]  After 6pm go to [email or web address] - password [email or web address] [facility]  [facility]  Office  [phone]  CC: Primary care physician; Titus Mould, NP

## 2017-12-31 NOTE — Progress Notes (Signed)
PHARMACY - PHYSICIAN COMMUNICATION  Per [facility] protocol canagliflozin (INVOKANA) tablet 100 mg has been discontinued.    Consider adding sliding scale insulin while patient is admitted.   Gardner Candle, PharmD, BCPS Clinical Pharmacist 12/31/2017 5:53 PM
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
## 2017-12-31 NOTE — Progress Notes (Signed)
PHARMACIST - PHYSICIAN COMMUNICATION  CONCERNING:  Enoxaparin (Lovenox) for DVT Prophylaxis    RECOMMENDATION: Patient was prescribed enoxaprin 40mg  q24 hours for VTE prophylaxis.   Filed Weights   12/31/17 1228  Weight: 232 lb 15 oz (105.7 kg)    Body mass index is 42.6 kg/m.  Estimated Creatinine Clearance: 63.8 mL/min (by C-G formula based on SCr of 0.99 mg/dL).  Based on [facility] policy patient is candidate for enoxaparin 40mg  every 12 hour dosing due to BMI being >40.  DESCRIPTION: Pharmacy has adjusted enoxaparin dose per [facility] policy, approved through P [employer] committee.  Patient is now receiving enoxaparin 40mg  every 12 hours.   , PharmD, BCPS Clinical Pharmacist 12/31/2017 5:49 PM

## 2017-12-31 NOTE — ED Notes (Signed)
Attempted iv access by myself and Suzzanne Cloud as well sev attempts no success

## 2017-12-31 NOTE — ED Triage Notes (Signed)
Per  Ems report pt  reporta weakness Home health nurse has altered mental status  And hypoglycenia. Ems obtained cbg 47 initially. Pt jhas  Some weakness last pm  Last normal status last pm . After ems gave cookies glucose  Tabs Cbg 75   By ems

## 2018-01-01 ENCOUNTER — Observation Stay: Payer: Medicare Other

## 2018-01-01 DIAGNOSIS — R531 Weakness: Secondary | ICD-10-CM

## 2018-01-01 LAB — BASIC METABOLIC PANEL
Anion gap: 8 (ref 5–15)
BUN: 28 mg/dL — AB (ref 8–23)
CHLORIDE: 106 mmol/L (ref 98–111)
CO2: 23 mmol/L (ref 22–32)
CREATININE: 1.14 mg/dL — AB (ref 0.44–1.00)
Calcium: 8.6 mg/dL — ABNORMAL LOW (ref 8.9–10.3)
GFR calc Af Amer: 57 mL/min — ABNORMAL LOW (ref >60)
GFR, EST NON AFRICAN AMERICAN: 49 mL/min — AB (ref >60)
GLUCOSE: 240 mg/dL — AB (ref 70–99)
POTASSIUM: 4 mmol/L (ref 3.5–5.1)
Sodium: 137 mmol/L (ref 135–145)

## 2018-01-01 LAB — HEMOGLOBIN A1C
HEMOGLOBIN A1C: 10.4 % — AB (ref 4.8–5.6)
MEAN PLASMA GLUCOSE: 251.78 mg/dL

## 2018-01-01 LAB — URINALYSIS, COMPLETE (UACMP) WITH MICROSCOPIC
BACTERIA UA: NONE SEEN
Bilirubin Urine: NEGATIVE
GLUCOSE, UA: 150 mg/dL — AB
HGB URINE DIPSTICK: NEGATIVE
Ketones, ur: NEGATIVE mg/dL
LEUKOCYTES UA: NEGATIVE
NITRITE: NEGATIVE
PH: 5 (ref 5.0–8.0)
PROTEIN: NEGATIVE mg/dL
Specific Gravity, Urine: 1.02 (ref 1.005–1.030)

## 2018-01-01 LAB — CBC
HEMATOCRIT: 40.1 % (ref 35.0–47.0)
Hemoglobin: 13.5 g/dL (ref 12.0–16.0)
MCH: 28.7 pg (ref 26.0–34.0)
MCHC: 33.6 g/dL (ref 32.0–36.0)
MCV: 85.3 fL (ref 80.0–100.0)
Platelets: 210 10*3/uL (ref 150–440)
RBC: 4.7 MIL/uL (ref 3.80–5.20)
RDW: 14.2 % (ref 11.5–14.5)
WBC: 6.3 10*3/uL (ref 3.6–11.0)

## 2018-01-01 LAB — ECHOCARDIOGRAM COMPLETE
Height: 62 in
Weight: 3727 oz

## 2018-01-01 LAB — LIPID PANEL
CHOL/HDL RATIO: 2.2 ratio
Cholesterol: 209 mg/dL — ABNORMAL HIGH (ref 0–200)
HDL: 93 mg/dL (ref >40)
LDL Cholesterol: 61 mg/dL (ref 0–99)
Triglycerides: 277 mg/dL — ABNORMAL HIGH (ref <150)
VLDL: 55 mg/dL — AB (ref 0–40)

## 2018-01-01 LAB — GLUCOSE, CAPILLARY
GLUCOSE-CAPILLARY: 286 mg/dL — AB (ref 70–99)
Glucose-Capillary: 236 mg/dL — ABNORMAL HIGH (ref 70–99)

## 2018-01-01 MED ORDER — PRAVASTATIN SODIUM 40 MG PO TABS: 40.0000 mg | ORAL_TABLET | Freq: Every day | ORAL | 0 refills | Status: DC

## 2018-01-01 MED ORDER — CLOPIDOGREL BISULFATE 75 MG PO TABS: 75.0000 mg | ORAL_TABLET | Freq: Every day | ORAL | Status: DC

## 2018-01-01 MED ORDER — CLOPIDOGREL BISULFATE 75 MG PO TABS: 75.0000 mg | ORAL_TABLET | Freq: Every day | ORAL | 0 refills | Status: AC

## 2018-01-01 NOTE — Progress Notes (Addendum)
Inpatient Diabetes Program Recommendations  AACE/ADA: New Consensus Statement on Inpatient Glycemic Control (2015)  Target Ranges:  Prepandial:   less than 140 mg/dL      Peak postprandial:   less than 180 mg/dL (1-2 hours)      Critically ill patients:  140 - 180 mg/dL   Results for FAYDRA, KORMAN (MRN [id number]) as of 01/01/2018 08:09  Ref. Range 12/31/2017 12:31 12/31/2017 21:07 01/01/2018 07:31  Glucose-Capillary Latest Ref Range: 70 - 99 mg/dL 74 408 (H)  3 units NOVOLOG  286 (H)  5 units NOVOLOG        Home DM Meds: Humalog 12 units daily + Farxiga 10 mg daily         Per Care Everywhere:       Dr. Tedd Sias requested pt take the following:       Tresiba 10 units daily        Humalog 10 units with meal if CBG>200 and 15 units with meal if CBG >300)       See Telephone interaction from 11/29/2017 per Care Everywhere    Current Orders: Novolog Sensitive Correction Scale/ SSI (0-9 units) TID AC + HS      ENDO: Dr. Tedd Sias ([facility])--Last Seen 11/16/2017--Telephoned Dr. Tedd Sias on 11/29/2017  Per H&P, patient with Hypoglycemia en route to hospital (CBGs in the 60s)  Now with Hyperglycemia.      MD- Please consider starting low dose basal insulin for this patient:  Lantus 10 units daily (0.1 units kg dosing based on weight of 105 kg)     --Will follow patient during hospitalization--  Ambrose Finland RN, MSN, CDE Diabetes Coordinator Inpatient Glycemic Control Team Team Pager: [phone] (8a-5p)

## 2018-01-01 NOTE — Progress Notes (Signed)
Discharge instructions reviewed with patient. Daughter at bedside to assist with transportation home. Patient has HH currently in the home. IV removed without complications. Patient awaiting w/c for transport to private vehicle.

## 2018-01-01 NOTE — Consult Note (Signed)
Referring Physician: Dr. Houston Siren MD    Chief Complaint:   HPI: Carrie Gardner is an 67 y.o. female presenting with chief complaints of left sided weakness, confusion and bilateral frontal headache Per ED report. She does not recall the events and is noted to be a poor historian. Per HPI from admitting doctor, she was in her usual state of health the morning of 12/31/2017 when a home health nurse  Noted that her BP was elevated, blood glucose low and not at her baseline. She report that she felt unusually weak, clammy with blurry vision. Home health nurse called EMS and she was transported to the ED. On arrival, she was noted to be confused and in a hypoglycemic state with blood glucose in the 60s, per EMS report her initial blood glucose was in the 40s so they gave her cookies glucose tab with improvement noted. Given her left side weakness and facial droop, stroke was suspected so a CT head was obtained which was negative with no acute abnormality noted. Due to her significant risk factors for stroke including past hx of uncontrolled diabetes with complications, prior stroke, hypertension, hyperlipidemia, seizures, OSA and thyroid dysfunction she was admitted for further stroke work up.  Date last known well: Date: 12/31/2016 Time last known well: Unable to determine tPA Given: No: outside of time window   Past Medical History:  Diagnosis Date  . Allergy   . Arthritis   . Bradycardia   . Cataract   . Chronic kidney disease   . COPD (chronic obstructive pulmonary disease) (HCC)   . Diabetes mellitus without complication (HCC)   . Fibromyalgia   . Generalized anxiety disorder 02/09/2009  . Hyperlipidemia 10/20/2006  . Hypertension   . Hypothyroidism   . Opiate use 06/03/2015  . Presence of permanent cardiac pacemaker   . Sciatica   . Seizures (HCC)   . Sleep apnea   . Thyroid disease   . TIA (transient ischemic attack)   . Vitiligo     Past Surgical History:  Procedure  Laterality Date  . ABDOMINAL HYSTERECTOMY    . CHOLECYSTECTOMY    . COLONOSCOPY    . LAPAROSCOPIC GASTRIC BAND REMOVAL WITH LAPAROSCOPIC GASTRIC SLEEVE RESECTION    . LAPAROSCOPIC GASTRIC BANDING    . LEFT HEART CATH AND CORONARY ANGIOGRAPHY Left 11/08/2016   Procedure: Left Heart Cath and Coronary Angiography;  Surgeon: Marcina Millard, MD;  Location: [facility];  Service: Cardiovascular;  Laterality: Left;  . PACEMAKER INSERTION    . SPINE SURGERY      Family History  Problem Relation Age of Onset  . Arthritis Mother   . Cancer Mother   . COPD Mother   . Diabetes Mother   . Vision loss Mother   . Heart disease Mother   . Hypertension Mother   . Kidney disease Mother   . Arthritis Father   . Asthma Father   . Depression Father   . Drug abuse Father   . Vision loss Father   . Hypertension Father   . Stroke Father    Social History:  reports that she has never smoked. She has never used smokeless tobacco. She reports that she drinks about 3.0 standard drinks of alcohol per week. She reports that she does not use drugs.  Allergies:  Allergies  Allergen Reactions  . Metformin Anaphylaxis  . Metformin And Related Anaphylaxis  . Pregabalin Anaphylaxis  . Ibuprofen Other (See Comments)    Other reaction(s):  Other (See Comments) Kidney disease Kidney issues Kidney disease  . Iodinated Diagnostic Agents Other (See Comments)    Other reaction(s): Other (See Comments) Patient reports "I can't have dye r/t my kidney problems." Kidney disease  . Lisinopril Other (See Comments)    Low back pain  . Mobic [Meloxicam] Other (See Comments)    Causes stomach cramps.  Patient states she is unable to tolerate.   . Oxycodone Rash  . Oxycodone-Acetaminophen Rash  . Tramadol Other (See Comments) and Itching    Hallucinations hallucinations    Medications:  I have reviewed the patient's current medications. Scheduled: . aspirin EC  81 mg Oral Daily  . calcium  citrate-vitamin D  2 tablet Oral Q breakfast  . cholecalciferol  1,000 Units Oral Daily  . enoxaparin (LOVENOX) injection  40 mg Subcutaneous Q12H  . furosemide  20 mg Oral Daily  . insulin aspart  0-5 Units Subcutaneous QHS  . insulin aspart  0-9 Units Subcutaneous TID WC  . levothyroxine  125 mcg Oral QAC breakfast  . linaclotide  145 mcg Oral Daily  . metoprolol tartrate  25 mg Oral BID  . nortriptyline  20 mg Oral QHS  . pantoprazole  40 mg Oral Daily  . pravastatin  40 mg Oral q1800    ROS: History obtained from the patient   General ROS: negative for - chills, fatigue, fever, night sweats, weight gain or weight loss Psychological ROS: negative for - behavioral disorder, hallucinations, memory difficulties, mood swings or suicidal ideation Ophthalmic ROS: negative for - , double vision, eye pain or loss of vision. Positive for blurry vision ENT ROS: negative for - epistaxis, nasal discharge, oral lesions, sore throat, tinnitus or vertigo Allergy and Immunology ROS: negative for - hives or itchy/watery eyes Hematological and Lymphatic ROS: negative for - bleeding problems, bruising or swollen lymph nodes Endocrine ROS: negative for - galactorrhea, hair pattern changes, polydipsia/polyuria or temperature intolerance Respiratory ROS: negative for - cough, hemoptysis, shortness of breath or wheezing Cardiovascular ROS: negative for - chest pain, dyspnea on exertion, edema or irregular heartbeat Gastrointestinal ROS: negative for - abdominal pain, diarrhea, hematemesis, nausea/vomiting or stool incontinence Genito-Urinary ROS: negative for - dysuria, hematuria, incontinence or urinary frequency/urgency Musculoskeletal ROS: negative for - joint swelling or muscular weakness. Positive for weakness in left arm and leg Neurological ROS: as noted in HPI Dermatological ROS: negative for rash and skin lesion changes  Physical Examination: Blood pressure (!) 146/74, pulse 75, temperature (!)  97.5 F (36.4 C), temperature source Oral, resp. rate 18, height 5\' 2"  (1.575 m), weight 105.7 kg, SpO2 100 %.  General Exam Patient looks appropriate of age, well built, nourished and appropriately groomed.  Cardiovascular Exam: S1, S2 heart sounds present Carotid exam revealed no bruit Lung exam was clear to auscultation ? Neurological Exam  Mental Status: Alert, Oriented to time, place, person and situation Attention span and concentration seemed appropriate Memory seemed OK. Intact naming, repetition, comprehension.  Followed 2 step commands - no dysarthria Fund of knowledge seemed appropriate for age and health status.  Cranial Nerves: Olfactory and vagus nerves not examined CN [id number] fields were full CN II/IV/VI: Pupils were equal, round and reactive to light and accommodation Extra-ocular movements are normal CN V/VII: Facial sensations are normal Face is symmetric CN VIII: Finger rub was heard symmetric in both ears CN IX/X: Palate and uvular movements are normal and oral sensations are OK CN XI: Neck muscle strength and shoulder shrug is normal CN XII:  Tongue protrusion and uvular elevation are normal  Motor Exam: Tone is normal in all extremities Muscle strength in all extremities is 5/5,   Left arm drift without pronation noted No focal weakness appreciated bilaterally when tested No abnormal movements, fasciculations or atrophy seen  Deep Tendon Reflexes: Right Biceps is 2+, Left Biceps is 2+ Right Triceps is 2+, Left Triceps is 2+ Right Brachioradialis is 2+, Left Brachioradialis is 2+ Right Knee Jerk is 1+, Left Knee Jerk is 1+ Right Ankle Jerk is 2+, Left Ankle Jerk is 2+ Right Toes are down going, Left Toes are down going  Sensory Exam: Sensations were decreased to light touch in lower extremities Vibration and proprioception are also impaired  Co-ordination: Finger to nose is normal Heel to chin is normal  Gait: Gait and station not tested  due to inability to stand without support. She uses a cane and walker at home.   Data Reviewed  Laboratory Studies:  Basic Metabolic Panel: Recent Labs  Lab 12/31/17 1251 01/01/18 0433  NA 140 137  K 3.9 4.0  CL 107 106  CO2 26 23  GLUCOSE 121* 240*  BUN 23 28*  CREATININE 0.99 1.14*  CALCIUM 9.2 8.6*    Liver Function Tests: Recent Labs  Lab 12/31/17 1251  AST 35  ALT 80*  ALKPHOS 62  BILITOT 0.3  PROT 6.3*  ALBUMIN 3.1*   No results for input(s): LIPASE, AMYLASE in the last 168 hours. No results for input(s): AMMONIA in the last 168 hours.  CBC: Recent Labs  Lab 12/31/17 1445 01/01/18 0433  WBC 6.8 6.3  NEUTROABS 5.1  --   HGB 15.0 13.5  HCT 46.0 40.1  MCV 85.7 85.3  PLT 189 210    Cardiac Enzymes: Recent Labs  Lab 12/31/17 1251  TROPONINI <0.03    BNP: Invalid input(s): POCBNP  CBG: Recent Labs  Lab 12/31/17 1231 12/31/17 2107 01/01/18 0731  GLUCAP 74 291* 286*    Microbiology: Results for orders placed or performed during the hospital encounter of 09/12/16  Urine culture     Status: Abnormal   Collection Time: 09/12/16 11:23 AM  Result Value Ref Range Status   Specimen Description URINE, CLEAN CATCH  Final   Special Requests NONE  Final   Culture MULTIPLE SPECIES PRESENT, SUGGEST RECOLLECTION (A)  Final   Report Status 09/13/2016 FINAL  Final    Coagulation Studies: Recent Labs    12/31/17 1251  LABPROT 12.0  INR 0.89    Urinalysis:  Recent Labs  Lab 12/31/17 1933  COLORURINE YELLOW*  LABSPEC 1.020  PHURINE 5.0  GLUCOSEU 150*  HGBUR NEGATIVE  BILIRUBINUR NEGATIVE  KETONESUR NEGATIVE  PROTEINUR NEGATIVE  NITRITE NEGATIVE  LEUKOCYTESUR NEGATIVE    Lipid Panel: No results found for: CHOL, TRIG, HDL, CHOLHDL, VLDL, LDLCALC  HgbA1C:  Lab Results  Component Value Date   HGBA1C 7.5 (H) 10/17/2017    Urine Drug Screen:      Component Value Date/Time   LABOPIA NONE DETECTED 12/31/2017 1933   COCAINSCRNUR  NONE DETECTED 12/31/2017 1933   LABBENZ TEST NOT PERFORMED, REAGENT NOT AVAILABLE (A) 12/31/2017 1933   AMPHETMU NONE DETECTED 12/31/2017 1933   THCU NONE DETECTED 12/31/2017 1933   LABBARB NONE DETECTED 12/31/2017 1933    Alcohol Level:  Recent Labs  Lab 12/31/17 1623  ETH <10    Other results:  EKG: Date: 12/31/2017  Rate: 69  Rhythm: normal sinus rhythm  QRS Axis: normal  Intervals: normal  ST/T Wave  abnormalities: normal  Conduction Disutrbances: none  Narrative Interpretation: unremarkable  Imaging: Dg Chest 1 View  Result Date: 12/31/2017 CLINICAL DATA:  Acute onset of LEFT-sided weakness that began this morning. Acute hypertension. Hypoglycemia. EXAM: Portable CHEST 1 VIEW COMPARISON:  09/12/2016, 05/19/2011 and earlier. FINDINGS: Cardiac silhouette normal in size, unchanged. LEFT subclavian dual lead transvenous pacemaker unchanged. Suboptimal inspiration which accounts for crowded bronchovascular markings at the bases. Taking this into account, lung parenchyma clear. Normal pulmonary vascularity. No pleural effusions. IMPRESSION: Suboptimal inspiration.  No acute cardiopulmonary disease. Electronically Signed   By: Hulan Saas M.D.   On: 12/31/2017 13:24   Ct Head Wo Contrast  Result Date: 12/31/2017 CLINICAL DATA:  Headache with hypertension. Hypoglycemia with confusion. EXAM: CT HEAD WITHOUT CONTRAST TECHNIQUE: Contiguous axial images were obtained from the base of the skull through the vertex without intravenous contrast. COMPARISON:  December 19, 2009 FINDINGS: Brain: The ventricles are normal in size and configuration. There is no intracranial mass, hemorrhage, extra-axial fluid collection, or midline shift. The gray-white compartments are within normal limits. No evident acute infarct. Vascular: No appreciable hyperdense vessel. There are foci of calcification in each carotid siphon. Skull: Bony calvarium appears intact. Areas of calcification along the falx are stable  and benign in appearance. Sinuses/Orbits: There is mucosal thickening in several ethmoid air cells. Other visualized paranasal sinuses are clear. Visualized orbits appear symmetric bilaterally. Other: Visualized mastoid air cells are clear. IMPRESSION: No mass or hemorrhage.  Gray-white compartments appear normal. Foci of arterial vascular calcification noted. Mucosal thickening noted in several ethmoid air cells. Electronically Signed   By: Bretta Bang III M.D.   On: 12/31/2017 13:39    Patient seen and examined.  Clinical course and management discussed.  Necessary edits performed.  I agree with the above.  Assessment and plan of care developed and discussed below.     Assessment: 67 y.o. female presenting with left sided weakness and facial droop concerning for stroke in the setting of hypoglycemic episode.  Currently neurological examination with multiple functional features but patient with multiple vascular risk factors.  Initial CT head reviewed and negative for acute intracranial abnormality. Follow up CT head reviewed this morning and  Unremarkable as well. Unable to obtain follow up MRI brain due to implanted pacemaker. [place] bilateral carotids shows mild atherosclerotic disease in the carotid arteries but no hemodynamically significant stenosis.  Echocardiogram is pending.  Stroke Risk Factors - diabetes mellitus, hyperlipidemia, hypertension and OSA, morbid obeisty  Plan:  1. HgbA1c, fasting lipid panel pending 2.  PT consult, OT consult, Speech consult 3. Patient was taking low dose Aspirin 81 mg at home prior to event. Will switch to Plavix 75 mg once a day and continue with aggressive medical management with Plavix 75 mg /day with intensive management of vascular risk factor to keep systolic BP (SBP) <140 mm Hg (458 mm Hg if diabetic) and low density lipoprotein (LDL) <70 mg/dl, and lifestyle modification. 4. NPO until RN stroke swallow screen 5. Telemetry monitoring 6. Frequent  neuro checks   Thana Farr, MD Neurology [phone]   01/01/2018, 9:08 AM

## 2018-01-01 NOTE — Care Management (Signed)
Patient active with [facility] for RN, PT, OT and SLP.

## 2018-01-01 NOTE — Care Management Obs Status (Signed)
MEDICARE OBSERVATION STATUS NOTIFICATION   Patient Details  Name: Carrie Gardner MRN: [id number] Date of Birth: 1950-06-18   Medicare Observation Status Notification Given:  Yes    Marily Memos, RN 01/01/2018, 3:26 PM
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
## 2018-01-13 ENCOUNTER — Ambulatory Visit: Payer: Medicare Other | Attending: Pain Medicine | Admitting: Pain Medicine

## 2018-01-13 ENCOUNTER — Encounter: Payer: Self-pay | Admitting: Pain Medicine

## 2018-01-13 ENCOUNTER — Other Ambulatory Visit: Payer: Self-pay

## 2018-01-13 VITALS — BP 137/122 | HR 100 | Temp 98.0°F | Resp 18 | Ht 62.0 in | Wt 228.0 lb

## 2018-01-13 DIAGNOSIS — Z885 Allergy status to narcotic agent status: Secondary | ICD-10-CM | POA: Insufficient documentation

## 2018-01-13 DIAGNOSIS — Z90710 Acquired absence of both cervix and uterus: Secondary | ICD-10-CM | POA: Insufficient documentation

## 2018-01-13 DIAGNOSIS — G2581 Restless legs syndrome: Secondary | ICD-10-CM | POA: Insufficient documentation

## 2018-01-13 DIAGNOSIS — R001 Bradycardia, unspecified: Secondary | ICD-10-CM | POA: Diagnosis not present

## 2018-01-13 DIAGNOSIS — Z79891 Long term (current) use of opiate analgesic: Secondary | ICD-10-CM | POA: Insufficient documentation

## 2018-01-13 DIAGNOSIS — M545 Low back pain: Secondary | ICD-10-CM

## 2018-01-13 DIAGNOSIS — M797 Fibromyalgia: Secondary | ICD-10-CM | POA: Insufficient documentation

## 2018-01-13 DIAGNOSIS — Z6841 Body Mass Index (BMI) 40.0 and over, adult: Secondary | ICD-10-CM | POA: Insufficient documentation

## 2018-01-13 DIAGNOSIS — M542 Cervicalgia: Secondary | ICD-10-CM | POA: Diagnosis not present

## 2018-01-13 DIAGNOSIS — N189 Chronic kidney disease, unspecified: Secondary | ICD-10-CM | POA: Diagnosis not present

## 2018-01-13 DIAGNOSIS — M79604 Pain in right leg: Secondary | ICD-10-CM | POA: Diagnosis not present

## 2018-01-13 DIAGNOSIS — Z8249 Family history of ischemic heart disease and other diseases of the circulatory system: Secondary | ICD-10-CM | POA: Insufficient documentation

## 2018-01-13 DIAGNOSIS — M47817 Spondylosis without myelopathy or radiculopathy, lumbosacral region: Secondary | ICD-10-CM | POA: Diagnosis not present

## 2018-01-13 DIAGNOSIS — K210 Gastro-esophageal reflux disease with esophagitis: Secondary | ICD-10-CM | POA: Diagnosis not present

## 2018-01-13 DIAGNOSIS — Z8673 Personal history of transient ischemic attack (TIA), and cerebral infarction without residual deficits: Secondary | ICD-10-CM | POA: Diagnosis not present

## 2018-01-13 DIAGNOSIS — M549 Dorsalgia, unspecified: Secondary | ICD-10-CM | POA: Diagnosis not present

## 2018-01-13 DIAGNOSIS — Z79899 Other long term (current) drug therapy: Secondary | ICD-10-CM | POA: Diagnosis not present

## 2018-01-13 DIAGNOSIS — Z7902 Long term (current) use of antithrombotics/antiplatelets: Secondary | ICD-10-CM | POA: Insufficient documentation

## 2018-01-13 DIAGNOSIS — Z825 Family history of asthma and other chronic lower respiratory diseases: Secondary | ICD-10-CM | POA: Insufficient documentation

## 2018-01-13 DIAGNOSIS — J449 Chronic obstructive pulmonary disease, unspecified: Secondary | ICD-10-CM | POA: Insufficient documentation

## 2018-01-13 DIAGNOSIS — E039 Hypothyroidism, unspecified: Secondary | ICD-10-CM | POA: Insufficient documentation

## 2018-01-13 DIAGNOSIS — Z8261 Family history of arthritis: Secondary | ICD-10-CM | POA: Insufficient documentation

## 2018-01-13 DIAGNOSIS — I129 Hypertensive chronic kidney disease with stage 1 through stage 4 chronic kidney disease, or unspecified chronic kidney disease: Secondary | ICD-10-CM | POA: Insufficient documentation

## 2018-01-13 DIAGNOSIS — Z886 Allergy status to analgesic agent status: Secondary | ICD-10-CM | POA: Insufficient documentation

## 2018-01-13 DIAGNOSIS — M47816 Spondylosis without myelopathy or radiculopathy, lumbar region: Secondary | ICD-10-CM

## 2018-01-13 DIAGNOSIS — G4730 Sleep apnea, unspecified: Secondary | ICD-10-CM | POA: Insufficient documentation

## 2018-01-13 DIAGNOSIS — Z841 Family history of disorders of kidney and ureter: Secondary | ICD-10-CM | POA: Insufficient documentation

## 2018-01-13 DIAGNOSIS — Z823 Family history of stroke: Secondary | ICD-10-CM | POA: Insufficient documentation

## 2018-01-13 DIAGNOSIS — Z950 Presence of cardiac pacemaker: Secondary | ICD-10-CM | POA: Insufficient documentation

## 2018-01-13 DIAGNOSIS — E559 Vitamin D deficiency, unspecified: Secondary | ICD-10-CM | POA: Insufficient documentation

## 2018-01-13 DIAGNOSIS — M79605 Pain in left leg: Secondary | ICD-10-CM | POA: Diagnosis not present

## 2018-01-13 DIAGNOSIS — G8929 Other chronic pain: Secondary | ICD-10-CM

## 2018-01-13 DIAGNOSIS — E1122 Type 2 diabetes mellitus with diabetic chronic kidney disease: Secondary | ICD-10-CM | POA: Insufficient documentation

## 2018-01-13 DIAGNOSIS — E11649 Type 2 diabetes mellitus with hypoglycemia without coma: Secondary | ICD-10-CM | POA: Insufficient documentation

## 2018-01-13 DIAGNOSIS — Z9049 Acquired absence of other specified parts of digestive tract: Secondary | ICD-10-CM | POA: Insufficient documentation

## 2018-01-13 DIAGNOSIS — E1142 Type 2 diabetes mellitus with diabetic polyneuropathy: Secondary | ICD-10-CM | POA: Insufficient documentation

## 2018-01-13 DIAGNOSIS — Z7982 Long term (current) use of aspirin: Secondary | ICD-10-CM | POA: Insufficient documentation

## 2018-01-13 DIAGNOSIS — R079 Chest pain, unspecified: Secondary | ICD-10-CM | POA: Diagnosis not present

## 2018-01-13 DIAGNOSIS — R569 Unspecified convulsions: Secondary | ICD-10-CM | POA: Diagnosis not present

## 2018-01-13 DIAGNOSIS — Z9889 Other specified postprocedural states: Secondary | ICD-10-CM | POA: Insufficient documentation

## 2018-01-13 DIAGNOSIS — Z888 Allergy status to other drugs, medicaments and biological substances status: Secondary | ICD-10-CM | POA: Insufficient documentation

## 2018-01-13 DIAGNOSIS — Z833 Family history of diabetes mellitus: Secondary | ICD-10-CM | POA: Insufficient documentation

## 2018-01-13 DIAGNOSIS — E785 Hyperlipidemia, unspecified: Secondary | ICD-10-CM | POA: Diagnosis not present

## 2018-01-13 DIAGNOSIS — G894 Chronic pain syndrome: Secondary | ICD-10-CM

## 2018-01-13 DIAGNOSIS — Z818 Family history of other mental and behavioral disorders: Secondary | ICD-10-CM | POA: Insufficient documentation

## 2018-01-13 MED ORDER — OXYCODONE-ACETAMINOPHEN 5-325 MG PO TABS: 1.0000 | ORAL_TABLET | Freq: Two times a day (BID) | ORAL | 0 refills | Status: DC

## 2018-01-13 NOTE — Patient Instructions (Addendum)
____________________________________________________________________________________________  Preparing for Procedure with Sedation  Instructions: . Oral Intake: Do not eat or drink anything for at least 8 hours prior to your procedure. . Transportation: Public transportation is not allowed. Bring an adult driver. The driver must be physically present in our waiting room before any procedure can be started. . Physical Assistance: Bring an adult physically capable of assisting you, in the event you need help. This adult should keep you company at home for at least 6 hours after the procedure. . Blood Pressure Medicine: Take your blood pressure medicine with a sip of water the morning of the procedure. . Blood thinners: Notify our staff if you are taking any blood thinners. Depending on which one you take, there will be specific instructions on how and when to stop it. . Diabetics on insulin: Notify the staff so that you can be scheduled 1st case in the morning. If your diabetes requires high dose insulin, take only  of your normal insulin dose the morning of the procedure and notify the staff that you have done so. . Preventing infections: Shower with an antibacterial soap the morning of your procedure. . Build-up your immune system: Take 1000 mg of Vitamin C with every meal (3 times a day) the day prior to your procedure. . Antibiotics: Inform the staff if you have a condition or reason that requires you to take antibiotics before dental procedures. . Pregnancy: If you are pregnant, call and cancel the procedure. . Sickness: If you have a cold, fever, or any active infections, call and cancel the procedure. . Arrival: You must be in the facility at least 30 minutes prior to your scheduled procedure. . Children: Do not bring children with you. . Dress appropriately: Bring dark clothing that you would not mind if they get stained. . Valuables: Do not bring any jewelry or valuables.  Procedure  appointments are reserved for interventional treatments only. . No Prescription Refills. . No medication changes will be discussed during procedure appointments. . No disability issues will be discussed.  Reasons to call and reschedule or cancel your procedure: (Following these recommendations will minimize the risk of a serious complication.) . Surgeries: Avoid having procedures within 2 weeks of any surgery. (Avoid for 2 weeks before or after any surgery). . Flu Shots: Avoid having procedures within 2 weeks of a flu shots or . (Avoid for 2 weeks before or after immunizations). . Barium: Avoid having a procedure within 7-10 days after having had a radiological study involving the use of radiological contrast. (Myelograms, Barium swallow or enema study). . Heart attacks: Avoid any elective procedures or surgeries for the initial 6 months after a "Myocardial Infarction" (Heart Attack). . Blood thinners: It is imperative that you stop these medications before procedures. Let us know if you if you take any blood thinner.  . Infection: Avoid procedures during or within two weeks of an infection (including chest colds or gastrointestinal problems). Symptoms associated with infections include: Localized redness, fever, chills, night sweats or profuse sweating, burning sensation when voiding, cough, congestion, stuffiness, runny nose, sore throat, diarrhea, nausea, vomiting, cold or Flu symptoms, recent or current infections. It is specially important if the infection is over the area that we intend to treat. . Heart and lung problems: Symptoms that may suggest an active cardiopulmonary problem include: cough, chest pain, breathing difficulties or shortness of breath, dizziness, ankle swelling, uncontrolled high or unusually low blood pressure, and/or palpitations. If you are experiencing any of these symptoms, cancel   your procedure and contact your primary care physician for an evaluation.  Remember:   Regular Business hours are:  Monday to Thursday 8:00 AM to 4:00 PM  Provider's Schedule: Zaheer Wageman, MD:  Procedure days: Tuesday and Thursday 7:30 AM to 4:00 PM  Bilal Lateef, MD:  Procedure days: Monday and Wednesday 7:30 AM to 4:00 PM ____________________________________________________________________________________________   Pain Management Discharge Instructions  General Discharge Instructions :  If you need to reach your doctor call: Monday-Friday 8:00 am - 4:00 pm at [phone] or toll free [phone].  After clinic hours [phone] to have operator reach doctor.  Bring all of your medication bottles to all your appointments in the pain clinic.  To cancel or reschedule your appointment with Pain Management please remember to call 24 hours in advance to avoid a fee.  Refer to the educational materials which you have been given on: General Risks, I had my Procedure. Discharge Instructions, Post Sedation.  Post Procedure Instructions:  The drugs you were given will stay in your system until tomorrow, so for the next 24 hours you should not drive, make any legal decisions or drink any alcoholic beverages.  You may eat anything you prefer, but it is better to start with liquids then soups and crackers, and gradually work up to solid foods.  Please notify your doctor immediately if you have any unusual bleeding, trouble breathing or pain that is not related to your normal pain.  Depending on the type of procedure that was done, some parts of your body may feel week and/or numb.  This usually clears up by tonight or the next day.  Walk with the use of an assistive device or accompanied by an adult for the 24 hours.  You may use ice on the affected area for the first 24 hours.  Put ice in a Ziploc bag and cover with a towel and place against area 15 minutes on 15 minutes off.  You may switch to heat after 24 hours.GENERAL RISKS AND COMPLICATIONS  What are the  risk, side effects and possible complications? Generally speaking, most procedures are safe.  However, with any procedure there are risks, side effects, and the possibility of complications.  The risks and complications are dependent upon the sites that are lesioned, or the type of nerve block to be performed.  The closer the procedure is to the spine, the more serious the risks are.  Great care is taken when placing the radio frequency needles, block needles or lesioning probes, but sometimes complications can occur. 1. Infection: Any time there is an injection through the skin, there is a risk of infection.  This is why sterile conditions are used for these blocks.  There are four possible types of infection. 1. Localized skin infection. 2. Central Nervous System Infection-This can be in the form of Meningitis, which can be deadly. 3. Epidural Infections-This can be in the form of an epidural abscess, which can cause pressure inside of the spine, causing compression of the spinal cord with subsequent paralysis. This would require an emergency surgery to decompress, and there are no guarantees that the patient would recover from the paralysis. 4. Discitis-This is an infection of the intervertebral discs.  It occurs in about 1% of discography procedures.  It is difficult to treat and it may lead to surgery.        2. Pain: the needles have to go through skin and soft tissues, will cause soreness.       3.   Damage to internal structures:  The nerves to be lesioned may be near blood vessels or    other nerves which can be potentially damaged.       4. Bleeding: Bleeding is more common if the patient is taking blood thinners such as  aspirin, Coumadin, Ticiid, Plavix, etc., or if he/she have some genetic predisposition  such as hemophilia. Bleeding into the spinal canal can cause compression of the spinal  cord with subsequent paralysis.  This would require an emergency surgery to  decompress and there are no  guarantees that the patient would recover from the  paralysis.       5. Pneumothorax:  Puncturing of a lung is a possibility, every time a needle is introduced in  the area of the chest or upper back.  Pneumothorax refers to free air around the  collapsed lung(s), inside of the thoracic cavity (chest cavity).  Another two possible  complications related to a similar event would include: Hemothorax and Chylothorax.   These are variations of the Pneumothorax, where instead of air around the collapsed  lung(s), you may have blood or chyle, respectively.       6. Spinal headaches: They may occur with any procedures in the area of the spine.       7. Persistent CSF (Cerebro-Spinal Fluid) leakage: This is a rare problem, but may occur  with prolonged intrathecal or epidural catheters either due to the formation of a fistulous  track or a dural tear.       8. Nerve damage: By working so close to the spinal cord, there is always a possibility of  nerve damage, which could be as serious as a permanent spinal cord injury with  paralysis.       9. Death:  Although rare, severe deadly allergic reactions known as "Anaphylactic  reaction" can occur to any of the medications used.      10. Worsening of the symptoms:  We can always make thing worse.  What are the chances of something like this happening? Chances of any of this occuring are extremely low.  By statistics, you have more of a chance of getting killed in a motor vehicle accident: while driving to the hospital than any of the above occurring .  Nevertheless, you should be aware that they are possibilities.  In general, it is similar to taking a shower.  Everybody knows that you can slip, hit your head and get killed.  Does that mean that you should not shower again?  Nevertheless always keep in mind that statistics do not mean anything if you happen to be on the wrong side of them.  Even if a procedure has a 1 (one) in a 1,000,000 (million) chance of going  wrong, it you happen to be that one..Also, keep in mind that by statistics, you have more of a chance of having something go wrong when taking medications.  Who should not have this procedure? If you are on a blood thinning medication (e.g. Coumadin, Plavix, see list of "Blood Thinners"), or if you have an active infection going on, you should not have the procedure.  If you are taking any blood thinners, please inform your physician.  How should I prepare for this procedure?  Do not eat or drink anything at least six hours prior to the procedure.  Bring a driver with you .  It cannot be a taxi.  Come accompanied by an adult that can drive you back, and that   is strong enough to help you if your legs get weak or numb from the local anesthetic.  Take all of your medicines the morning of the procedure with just enough water to swallow them.  If you have diabetes, make sure that you are scheduled to have your procedure done first thing in the morning, whenever possible.  If you have diabetes, take only half of your insulin dose and notify our nurse that you have done so as soon as you arrive at the clinic.  If you are diabetic, but only take blood sugar pills (oral hypoglycemic), then do not take them on the morning of your procedure.  You may take them after you have had the procedure.  Do not take aspirin or any aspirin-containing medications, at least eleven (11) days prior to the procedure.  They may prolong bleeding.  Wear loose fitting clothing that may be easy to take off and that you would not mind if it got stained with Betadine or blood.  Do not wear any jewelry or perfume  Remove any nail coloring.  It will interfere with some of our monitoring equipment.  NOTE: Remember that this is not meant to be interpreted as a complete list of all possible complications.  Unforeseen problems may occur.  BLOOD THINNERS The following drugs contain aspirin or other products, which can cause  increased bleeding during surgery and should not be taken for 2 weeks prior to and 1 week after surgery.  If you should need take something for relief of minor pain, you may take acetaminophen which is found in Tylenol,m Datril, Anacin-3 and Panadol. It is not blood thinner. The products listed below are.  Do not take any of the products listed below in addition to any listed on your instruction sheet.  A.P.C or A.P.C with Codeine Codeine Phosphate Capsules #3 Ibuprofen Ridaura  ABC compound Congesprin Imuran rimadil  Advil Cope Indocin Robaxisal  Alka-Seltzer Effervescent Pain Reliever and Antacid Coricidin or Coricidin-D  Indomethacin Rufen  Alka-Seltzer plus Cold Medicine Cosprin Ketoprofen S-A-C Tablets  Anacin Analgesic Tablets or Capsules Coumadin Korlgesic Salflex  Anacin Extra Strength Analgesic tablets or capsules CP-2 Tablets Lanoril Salicylate  Anaprox Cuprimine Capsules Levenox Salocol  Anexsia-D Dalteparin Magan Salsalate  Anodynos Darvon compound Magnesium Salicylate Sine-off  Ansaid Dasin Capsules Magsal Sodium Salicylate  Anturane Depen Capsules Marnal Soma  APF Arthritis pain formula Dewitt's Pills Measurin Stanback  Argesic Dia-Gesic Meclofenamic Sulfinpyrazone  Arthritis Bayer Timed Release Aspirin Diclofenac Meclomen Sulindac  Arthritis pain formula Anacin Dicumarol Medipren Supac  Analgesic (Safety coated) Arthralgen Diffunasal Mefanamic Suprofen  Arthritis Strength Bufferin Dihydrocodeine Mepro Compound Suprol  Arthropan liquid Dopirydamole Methcarbomol with Aspirin Synalgos  ASA tablets/Enseals Disalcid Micrainin Tagament  Ascriptin Doan's Midol Talwin  Ascriptin A/D Dolene Mobidin Tanderil  Ascriptin Extra Strength Dolobid Moblgesic Ticlid  Ascriptin with Codeine Doloprin or Doloprin with Codeine Momentum Tolectin  Asperbuf Duoprin Mono-gesic Trendar  Aspergum Duradyne Motrin or Motrin IB Triminicin  Aspirin plain, buffered or enteric coated Durasal Myochrisine  Trigesic  Aspirin Suppositories Easprin Nalfon Trillsate  Aspirin with Codeine Ecotrin Regular or Extra Strength Naprosyn Uracel  Atromid-S Efficin Naproxen Ursinus  Auranofin Capsules Elmiron Neocylate Vanquish  Axotal Emagrin Norgesic Verin  Azathioprine Empirin or Empirin with Codeine Normiflo Vitamin E  Azolid Emprazil Nuprin Voltaren  Bayer Aspirin plain, buffered or children's or timed BC Tablets or powders Encaprin Orgaran Warfarin Sodium  Buff-a-Comp Enoxaparin Orudis Zorpin  Buff-a-Comp with Codeine Equegesic Os-Cal-Gesic   Buffaprin Excedrin plain, buffered or   Extra Strength Oxalid   Bufferin Arthritis Strength Feldene Oxphenbutazone   Bufferin plain or Extra Strength Feldene Capsules Oxycodone with Aspirin   Bufferin with Codeine Fenoprofen Fenoprofen Pabalate or Pabalate-SF   Buffets II Flogesic Panagesic   Buffinol plain or Extra Strength Florinal or Florinal with Codeine Panwarfarin   Buf-Tabs Flurbiprofen Penicillamine   Butalbital Compound Four-way cold tablets Penicillin   Butazolidin Fragmin Pepto-Bismol   Carbenicillin Geminisyn Percodan   Carna Arthritis Reliever Geopen Persantine   Carprofen Gold's salt Persistin   Chloramphenicol Goody's Phenylbutazone   Chloromycetin Haltrain Piroxlcam   Clmetidine heparin Plaquenil   Cllnoril Hyco-pap Ponstel   Clofibrate Hydroxy chloroquine Propoxyphen         Before stopping any of these medications, be sure to consult the physician who ordered them.  Some, such as Coumadin (Warfarin) are ordered to prevent or treat serious conditions such as "deep thrombosis", "pumonary embolisms", and other heart problems.  The amount of time that you may need off of the medication may also vary with the medication and the reason for which you were taking it.  If you are taking any of these medications, please make sure you notify your pain physician before you undergo any procedures.         Radiofrequency Lesioning Radiofrequency  lesioning is a procedure that is performed to relieve pain. The procedure is often used for back, neck, or arm pain. Radiofrequency lesioning involves the use of a machine that creates radio waves to make heat. During the procedure, the heat is applied to the nerve that carries the pain signal. The heat damages the nerve and interferes with the pain signal. Pain relief usually starts about 2 weeks after the procedure and lasts for 6 months to 1 year. Tell a health care provider about:  Any allergies you have.  All medicines you are taking, including vitamins, herbs, eye drops, creams, and over-the-counter medicines.  Any problems you or family members have had with anesthetic medicines.  Any blood disorders you have.  Any surgeries you have had.  Any medical conditions you have.  Whether you are pregnant or may be pregnant. What are the risks? Generally, this is a safe procedure. However, problems may occur, including:  Pain or soreness at the injection site.  Infection at the injection site.  Damage to nerves or blood vessels.  What happens before the procedure?  Ask your health care provider about: ? Changing or stopping your regular medicines. This is especially important if you are taking diabetes medicines or blood thinners. ? Taking medicines such as aspirin and ibuprofen. These medicines can thin your blood. Do not take these medicines before your procedure if your health care provider instructs you not to.  Follow instructions from your health care provider about eating or drinking restrictions.  Plan to have someone take you home after the procedure.  If you go home right after the procedure, plan to have someone with you for 24 hours. What happens during the procedure?  You will be given one or more of the following: ? A medicine to help you relax (sedative). ? A medicine to numb the area (local anesthetic).  You will be awake during the procedure. You will need to  be able to talk with the health care provider during the procedure.  With the help of a type of X-ray (fluoroscopy), the health care provider will insert a radiofrequency needle into the area to be treated.  Next, a wire that carries the radio   waves (electrode) will be put through the radiofrequency needle. An electrical pulse will be sent through the electrode to verify the correct nerve. You will feel a tingling sensation, and you may have muscle twitching.  Then, the tissue that is around the needle tip will be heated by an electric current that is passed using the radiofrequency machine. This will numb the nerves.  A bandage (dressing) will be put on the insertion area after the procedure is done. The procedure may vary among health care providers and hospitals. What happens after the procedure?  Your blood pressure, heart rate, breathing rate, and blood oxygen level will be monitored often until the medicines you were given have worn off.  Return to your normal activities as directed by your health care provider. This information is not intended to replace advice given to you by your health care provider. Make sure you discuss any questions you have with your health care provider. Document Released: 01/03/2011 Document Revised: 10/13/2015 Document Reviewed: 06/14/2014 [employer] Interactive Patient Education  2018 [employer]  

## 2018-01-13 NOTE — Progress Notes (Signed)
Nursing Pain Medication Assessment:  Safety precautions to be maintained throughout the outpatient stay will include: orient to surroundings, keep bed in low position, maintain call bell within reach at all times, provide assistance with transfer out of bed and ambulation.  Medication Inspection Compliance: Pill count conducted under aseptic conditions, in front of the patient. Neither the pills nor the bottle was removed from the patient's sight at any time. Once count was completed pills were immediately returned to the patient in their original bottle.  Medication: Oxycodone IR Pill/Patch Count: 0 of 60 pills remain Pill/Patch Appearance: Markings consistent with prescribed medication Bottle Appearance: Standard pharmacy container. Clearly labeled. Filled Date: 07 / 17 / 2019 Last Medication intake:  Ran out of medicine more than 48 hours ago

## 2018-01-16 NOTE — Discharge Summary (Signed)
[facility] [place] [facility]   PATIENT NAME: Carrie Gardner    MR#:  [id number]  DATE OF BIRTH:  Sep 06, 1950  DATE OF ADMISSION:  12/31/2017 ADMITTING PHYSICIAN: Houston Siren, MD  DATE OF DISCHARGE: 01/01/2018  3:34 PM  PRIMARY CARE PHYSICIAN: Titus Mould, NP   ADMISSION DIAGNOSIS:  Hypoglycemia [E16.2] Cerebral infarction (HCC) [I63.9] CVA (cerebral infarction) [I63.9] Acute ischemic stroke (HCC) [I63.9] Type 2 diabetes mellitus with complication, with long-term current use of insulin (HCC) [E11.8, Z79.4]  DISCHARGE DIAGNOSIS:  Active Problems:   Left-sided weakness   SECONDARY DIAGNOSIS:   Past Medical History:  Diagnosis Date  . Allergy   . Arthritis   . Bradycardia   . Cataract   . Chronic kidney disease   . COPD (chronic obstructive pulmonary disease) (HCC)   . Diabetes mellitus without complication (HCC)   . Fibromyalgia   . Generalized anxiety disorder 02/09/2009  . Hyperlipidemia 10/20/2006  . Hypertension   . Hypothyroidism   . Opiate use 06/03/2015  . Presence of permanent cardiac pacemaker   . Sciatica   . Seizures (HCC)   . Sleep apnea   . Stroke (HCC) 12/2017 X 2  . Thyroid disease   . TIA (transient ischemic attack)   . Vitiligo      ADMITTING HISTORY  HISTORY OF PRESENT ILLNESS:  Carrie Gardner  is a 67 y.o. female with a known history of hypertension, hyperlipidemia, hypothyroidism, status post pacemaker, history of seizures, history of previous TIA, diabetes, COPD who presents to the hospital due to altered mental status, left-sided facial droop and weakness.  Patient was in her usual state of health when her home health nurse noticed this morning that her blood pressure was somewhat high, her blood sugars were low and she was more confused than normal.  She called EMS and patient was brought to the hospital.  Patient was noted to be somewhat hypoglycemic with blood sugars in the 60s, her blood sugars have been  corrected now in the ER but she continues to have some left upper extremity weakness.  Patient CT head initially was negative for stroke.  Given her persistent left-sided weakness with some mild left-sided facial droop hospitalist services were contacted for admission for work-up of a stroke.  Patient denies any headache, nausea, vomiting, chest pain, numbness, tingling or any other associated symptoms presently.   HOSPITAL COURSE:   *TIA.  Could not get MRI.  CT head showed nothing acute.  Seen by neurology.  Complete stroke work-up with nothing acute found.  Advised medical management.  Symptoms resolved. Outpatient follow-up with primary care physician.  Patient's diabetes, hypothyroidism and hypertension stable.  Discharged home in stable condition.  CONSULTS OBTAINED:  Treatment Team:  Kym Groom, MD Thana Farr, MD  DRUG ALLERGIES:   Allergies  Allergen Reactions  . Metformin Anaphylaxis  . Metformin And Related Anaphylaxis  . Pregabalin Anaphylaxis  . Ibuprofen Other (See Comments)    Other reaction(s): Other (See Comments) Kidney disease Kidney issues Kidney disease  . Iodinated Diagnostic Agents Other (See Comments)    Other reaction(s): Other (See Comments) Patient reports "I can't have dye r/t my kidney problems." Kidney disease  . Lisinopril Other (See Comments)    Low back pain  . Mobic [Meloxicam] Other (See Comments)    Causes stomach cramps.  Patient states she is unable to tolerate.   . Oxycodone Rash  . Oxycodone-Acetaminophen Rash  . Tramadol Other (See Comments) and Itching  Hallucinations hallucinations    DISCHARGE MEDICATIONS:   Allergies as of 01/01/2018      Reactions   Metformin Anaphylaxis   Metformin And Related Anaphylaxis   Pregabalin Anaphylaxis   Ibuprofen Other (See Comments)   Other reaction(s): Other (See Comments) Kidney disease Kidney issues Kidney disease   Iodinated Diagnostic Agents Other (See Comments)    Other reaction(s): Other (See Comments) Patient reports "I can't have dye r/t my kidney problems." Kidney disease   Lisinopril Other (See Comments)   Low back pain   Mobic [meloxicam] Other (See Comments)   Causes stomach cramps.  Patient states she is unable to tolerate.    Oxycodone Rash   Oxycodone-acetaminophen Rash   Tramadol Other (See Comments), Itching   Hallucinations hallucinations      Medication List    STOP taking these medications   aspirin 81 MG tablet     TAKE these medications   ARTHRITIS STRENGTH BC POWDER PO Take 1 packet by mouth at bedtime.   clopidogrel 75 MG tablet Commonly known as:  PLAVIX Take 1 tablet (75 mg total) by mouth daily.   D3-1000 1000 units tablet Generic drug:  Cholecalciferol Take 1,000 Units by mouth daily.   diclofenac sodium 1 % Gel Commonly known as:  VOLTAREN Apply 4 g topically 4 (four) times daily.   FARXIGA 10 MG Tabs tablet Generic drug:  dapagliflozin propanediol Take 10 mg by mouth daily.   furosemide 20 MG tablet Commonly known as:  LASIX Take 20 mg by mouth daily.   glucagon 1 MG Solr injection Commonly known as:  GLUCAGEN Inject 1 mg into the vein once as needed for low blood sugar.   GNP CALCIUM 1200 1200-1000 MG-UNIT Chew Chew 1,200 mg by mouth daily with breakfast. Take in combination with vitamin D and magnesium.   HUMALOG KWIKPEN 100 UNIT/ML KiwkPen Generic drug:  insulin lispro Inject 12 Units into the skin daily.   levothyroxine 125 MCG tablet Commonly known as:  SYNTHROID, LEVOTHROID Take 125 mcg by mouth daily before breakfast.   lidocaine 5 % ointment Commonly known as:  XYLOCAINE Apply 1 application topically 4 (four) times daily as needed for moderate pain.   LINZESS 145 MCG Caps capsule Generic drug:  linaclotide Take 145 mcg by mouth daily.   metoprolol tartrate 25 MG tablet Commonly known as:  LOPRESSOR Take 25 mg by mouth 2 (two) times daily.   omeprazole 40 MG  capsule Commonly known as:  PRILOSEC Take 40 mg by mouth daily.   pravastatin 40 MG tablet Commonly known as:  PRAVACHOL Take 1 tablet (40 mg total) by mouth daily at 6 PM.       Today   VITAL SIGNS:  Blood pressure 139/64, pulse 75, temperature 98.1 F (36.7 C), temperature source Oral, resp. rate 18, height 5\' 2"  (1.575 m), weight 105.7 kg, SpO2 95 %.  I/O:  No intake or output data in the 24 hours ending 01/16/18 1347  PHYSICAL EXAMINATION:  Physical Exam  GENERAL:  67 y.o.-year-old patient lying in the bed with no acute distress.  LUNGS: Normal breath sounds bilaterally, no wheezing, rales,rhonchi or crepitation. No use of accessory muscles of respiration.  CARDIOVASCULAR: S1, S2 normal. No murmurs, rubs, or gallops.  ABDOMEN: Soft, non-tender, non-distended. Bowel sounds present. No organomegaly or mass.  NEUROLOGIC: Moves all 4 extremities. PSYCHIATRIC: The patient is alert and oriented x 3.  SKIN: No obvious rash, lesion, or ulcer.   DATA REVIEW:   CBC No results  for input(s): WBC, HGB, HCT, PLT in the last 168 hours.  Chemistries  No results for input(s): NA, K, CL, CO2, GLUCOSE, BUN, CREATININE, CALCIUM, MG, AST, ALT, ALKPHOS, BILITOT in the last 168 hours.  Invalid input(s): GFRCGP  Cardiac Enzymes No results for input(s): TROPONINI in the last 168 hours.  Microbiology Results  Results for orders placed or performed during the hospital encounter of 09/12/16  Urine culture     Status: Abnormal   Collection Time: 09/12/16 11:23 AM  Result Value Ref Range Status   Specimen Description URINE, CLEAN CATCH  Final   Special Requests NONE  Final   Culture MULTIPLE SPECIES PRESENT, SUGGEST RECOLLECTION (A)  Final   Report Status 09/13/2016 FINAL  Final    RADIOLOGY:  No results found.  Follow up with PCP in 1 week.  Management plans discussed with the patient, family and they are in agreement.  CODE STATUS:  Code Status History    Date Active Date  Inactive Code Status Order ID Comments User Context   12/31/2017 1726 01/01/2018 1849 Full Code [id number]  Houston Siren, MD ED   10/17/2017 2045 10/19/2017 1741 Full Code [id number]  Alford Highland, MD ED   11/08/2016 1020 11/08/2016 1440 Full Code [id number]  Marcina Millard, MD Inpatient      TOTAL TIME TAKING CARE OF THIS PATIENT ON DAY OF DISCHARGE: more than 30 minutes.   Molinda Bailiff Aleathea Pugmire M.D on 01/16/2018 at 1:47 PM  Between 7am to 6pm - Pager - [phone]  After 6pm go to [email or web address] - password [email or web address] [facility]  [facility] [place] [facility]  Office  [phone]  CC: Primary care physician; White, Arlyss Repress, NP  Note: This dictation was prepared with Dragon dictation along with smaller phrase technology. Any transcriptional errors that result from this process are unintentional.

## 2018-02-13 ENCOUNTER — Ambulatory Visit: Payer: Medicare Other | Attending: Nurse Practitioner | Admitting: Nurse Practitioner

## 2018-02-13 ENCOUNTER — Encounter: Payer: Self-pay | Admitting: Nurse Practitioner

## 2018-02-13 VITALS — BP 147/88 | HR 82 | Temp 97.5°F | Resp 16 | Ht 62.0 in | Wt 239.0 lb

## 2018-02-13 DIAGNOSIS — E039 Hypothyroidism, unspecified: Secondary | ICD-10-CM | POA: Insufficient documentation

## 2018-02-13 DIAGNOSIS — F329 Major depressive disorder, single episode, unspecified: Secondary | ICD-10-CM | POA: Diagnosis not present

## 2018-02-13 DIAGNOSIS — Z5181 Encounter for therapeutic drug level monitoring: Secondary | ICD-10-CM | POA: Diagnosis present

## 2018-02-13 DIAGNOSIS — E559 Vitamin D deficiency, unspecified: Secondary | ICD-10-CM | POA: Insufficient documentation

## 2018-02-13 DIAGNOSIS — G2581 Restless legs syndrome: Secondary | ICD-10-CM | POA: Insufficient documentation

## 2018-02-13 DIAGNOSIS — Z7982 Long term (current) use of aspirin: Secondary | ICD-10-CM | POA: Insufficient documentation

## 2018-02-13 DIAGNOSIS — M797 Fibromyalgia: Secondary | ICD-10-CM | POA: Diagnosis not present

## 2018-02-13 DIAGNOSIS — E785 Hyperlipidemia, unspecified: Secondary | ICD-10-CM | POA: Insufficient documentation

## 2018-02-13 DIAGNOSIS — G4730 Sleep apnea, unspecified: Secondary | ICD-10-CM | POA: Diagnosis not present

## 2018-02-13 DIAGNOSIS — M1990 Unspecified osteoarthritis, unspecified site: Secondary | ICD-10-CM | POA: Insufficient documentation

## 2018-02-13 DIAGNOSIS — M150 Primary generalized (osteo)arthritis: Secondary | ICD-10-CM | POA: Diagnosis not present

## 2018-02-13 DIAGNOSIS — Z886 Allergy status to analgesic agent status: Secondary | ICD-10-CM | POA: Insufficient documentation

## 2018-02-13 DIAGNOSIS — E1142 Type 2 diabetes mellitus with diabetic polyneuropathy: Secondary | ICD-10-CM | POA: Insufficient documentation

## 2018-02-13 DIAGNOSIS — Z79891 Long term (current) use of opiate analgesic: Secondary | ICD-10-CM | POA: Insufficient documentation

## 2018-02-13 DIAGNOSIS — M159 Polyosteoarthritis, unspecified: Secondary | ICD-10-CM

## 2018-02-13 DIAGNOSIS — K210 Gastro-esophageal reflux disease with esophagitis: Secondary | ICD-10-CM | POA: Insufficient documentation

## 2018-02-13 DIAGNOSIS — G894 Chronic pain syndrome: Secondary | ICD-10-CM | POA: Diagnosis not present

## 2018-02-13 DIAGNOSIS — M47817 Spondylosis without myelopathy or radiculopathy, lumbosacral region: Secondary | ICD-10-CM | POA: Insufficient documentation

## 2018-02-13 DIAGNOSIS — Z888 Allergy status to other drugs, medicaments and biological substances status: Secondary | ICD-10-CM | POA: Insufficient documentation

## 2018-02-13 DIAGNOSIS — I129 Hypertensive chronic kidney disease with stage 1 through stage 4 chronic kidney disease, or unspecified chronic kidney disease: Secondary | ICD-10-CM | POA: Diagnosis not present

## 2018-02-13 DIAGNOSIS — Z8673 Personal history of transient ischemic attack (TIA), and cerebral infarction without residual deficits: Secondary | ICD-10-CM | POA: Diagnosis not present

## 2018-02-13 DIAGNOSIS — J449 Chronic obstructive pulmonary disease, unspecified: Secondary | ICD-10-CM | POA: Diagnosis not present

## 2018-02-13 DIAGNOSIS — Z79899 Other long term (current) drug therapy: Secondary | ICD-10-CM | POA: Insufficient documentation

## 2018-02-13 DIAGNOSIS — Z6841 Body Mass Index (BMI) 40.0 and over, adult: Secondary | ICD-10-CM | POA: Diagnosis not present

## 2018-02-13 DIAGNOSIS — N189 Chronic kidney disease, unspecified: Secondary | ICD-10-CM | POA: Diagnosis not present

## 2018-02-13 DIAGNOSIS — E1122 Type 2 diabetes mellitus with diabetic chronic kidney disease: Secondary | ICD-10-CM | POA: Insufficient documentation

## 2018-02-13 DIAGNOSIS — M533 Sacrococcygeal disorders, not elsewhere classified: Secondary | ICD-10-CM | POA: Diagnosis not present

## 2018-02-13 DIAGNOSIS — Z885 Allergy status to narcotic agent status: Secondary | ICD-10-CM | POA: Insufficient documentation

## 2018-02-13 DIAGNOSIS — Z950 Presence of cardiac pacemaker: Secondary | ICD-10-CM | POA: Insufficient documentation

## 2018-02-13 MED ORDER — OXYCODONE-ACETAMINOPHEN 5-325 MG PO TABS: 1.0000 | ORAL_TABLET | Freq: Two times a day (BID) | ORAL | 0 refills | Status: DC

## 2018-02-13 MED ORDER — LIDOCAINE 5 % EX OINT: 1.0000 "application " | TOPICAL_OINTMENT | Freq: Four times a day (QID) | CUTANEOUS | 1 refills | Status: DC | PRN

## 2018-02-13 MED ORDER — DICLOFENAC SODIUM 1 % TD GEL: 4.0000 g | Freq: Four times a day (QID) | TRANSDERMAL | 99 refills | Status: DC

## 2018-02-13 MED ORDER — OXYCODONE-ACETAMINOPHEN 5-325 MG PO TABS: 1.0000 | ORAL_TABLET | Freq: Two times a day (BID) | ORAL | 0 refills | Status: DC | PRN

## 2018-02-13 NOTE — Progress Notes (Signed)
Nursing Pain Medication Assessment:  Safety precautions to be maintained throughout the outpatient stay will include: orient to surroundings, keep bed in low position, maintain call bell within reach at all times, provide assistance with transfer out of bed and ambulation.  Medication Inspection Compliance: Pill count conducted under aseptic conditions, in front of the patient. Neither the pills nor the bottle was removed from the patient's sight at any time. Once count was completed pills were immediately returned to the patient in their original bottle.  Medication: Oxycodone/APAP Pill/Patch Count: 0 of 60 pills remain Pill/Patch Appearance: Markings consistent with prescribed medication Bottle Appearance: Standard pharmacy container. Clearly labeled. Filled Date: 08 / 25 / 2019 Last Medication intake:  Yesterday

## 2018-02-13 NOTE — Patient Instructions (Addendum)
____________________________________________________________________________________________  Medication Rules  Applies to: All patients receiving prescriptions (written or electronic).  Pharmacy of record: Pharmacy where electronic prescriptions will be sent. If written prescriptions are taken to a different pharmacy, please inform the nursing staff. The pharmacy listed in the electronic medical record should be the one where you would like electronic prescriptions to be sent.  Prescription refills: Only during scheduled appointments. Applies to both, written and electronic prescriptions.  NOTE: The following applies primarily to controlled substances (Opioid* Pain Medications).   Patient's responsibilities: 1. Pain Pills: Bring all pain pills to every appointment (except for procedure appointments). 2. Pill Bottles: Bring pills in original pharmacy bottle. Always bring newest bottle. Bring bottle, even if empty. 3. Medication refills: You are responsible for knowing and keeping track of what medications you need refilled. The day before your appointment, write a list of all prescriptions that need to be refilled. Bring that list to your appointment and give it to the admitting nurse. Prescriptions will be written only during appointments. If you forget a medication, it will not be "Called in", "Faxed", or "electronically sent". You will need to get another appointment to get these prescribed. 4. Prescription Accuracy: You are responsible for carefully inspecting your prescriptions before leaving our office. Have the discharge nurse carefully go over each prescription with you, before taking them home. Make sure that your name is accurately spelled, that your address is correct. Check the name and dose of your medication to make sure it is accurate. Check the number of pills, and the written instructions to make sure they are clear and accurate. Make sure that you are given enough medication to last  until your next medication refill appointment. 5. Taking Medication: Take medication as prescribed. Never take more pills than instructed. Never take medication more frequently than prescribed. Taking less pills or less frequently is permitted and encouraged, when it comes to controlled substances (written prescriptions).  6. Inform other Doctors: Always inform, all of your healthcare providers, of all the medications you take. 7. Pain Medication from other Providers: You are not allowed to accept any additional pain medication from any other Doctor or Healthcare provider. There are two exceptions to this rule. (see below) In the event that you require additional pain medication, you are responsible for notifying us, as stated below. 8. Medication Agreement: You are responsible for carefully reading and following our Medication Agreement. This must be signed before receiving any prescriptions from our practice. Safely store a copy of your signed Agreement. Violations to the Agreement will result in no further prescriptions. (Additional copies of our Medication Agreement are available upon request.) 9. Laws, Rules, & Regulations: All patients are expected to follow all Federal and State Laws, Statutes, Rules, & Regulations. Ignorance of the Laws does not constitute a valid excuse. The use of any illegal substances is prohibited. 10. Adopted CDC guidelines & recommendations: Target dosing levels will be at or below 60 MME/day. Use of benzodiazepines** is not recommended.  Exceptions: There are only two exceptions to the rule of not receiving pain medications from other Healthcare Providers. 1. Exception #1 (Emergencies): In the event of an emergency (i.e.: accident requiring emergency care), you are allowed to receive additional pain medication. However, you are responsible for: As soon as you are able, call our office [phone], at any time of the day or night, and leave a message stating your name, the  date and nature of the emergency, and the name and dose of the medication   prescribed. In the event that your call is answered by a member of our staff, make sure to document and save the date, time, and the name of the person that took your information.  2. Exception #2 (Planned Surgery): In the event that you are scheduled by another doctor or dentist to have any type of surgery or procedure, you are allowed (for a period no longer than 30 days), to receive additional pain medication, for the acute post-op pain. However, in this case, you are responsible for picking up a copy of our "Post-op Pain Management for Surgeons" handout, and giving it to your surgeon or dentist. This document is available at our office, and does not require an appointment to obtain it. Simply go to our office during business hours (Monday-Thursday from 8:00 AM to 4:00 PM) (Friday 8:00 AM to 12:00 Noon) or if you have a scheduled appointment with us, prior to your surgery, and ask for it by name. In addition, you will need to provide us with your name, name of your surgeon, type of surgery, and date of procedure or surgery.  *Opioid medications include: morphine, codeine, oxycodone, oxymorphone, hydrocodone, hydromorphone, meperidine, tramadol, tapentadol, buprenorphine, fentanyl, methadone. **Benzodiazepine medications include: diazepam (Valium), alprazolam (Xanax), clonazepam (Klonopine), lorazepam (Ativan), clorazepate (Tranxene), chlordiazepoxide (Librium), estazolam (Prosom), oxazepam (Serax), temazepam (Restoril), triazolam (Halcion) (Last updated: 07/18/2017) ____________________________________________________________________________________________   ____________________________________________________________________________________________  Appointment Policy Summary  It is our goal and responsibility to provide the medical community with assistance in the evaluation and management of patients with chronic pain.  Unfortunately our resources are limited. Because we do not have an unlimited amount of time, or available appointments, we are required to closely monitor and manage their use. The following rules exist to maximize their use:  Patient's responsibilities: 1. Punctuality:  At what time should I arrive? You should be physically present in our office 30 minutes before your scheduled appointment. Your scheduled appointment is with your assigned healthcare provider. However, it takes 5-10 minutes to be "checked-in", and another 15 minutes for the nurses to do the admission. If you arrive to our office at the time you were given for your appointment, you will end up being at least 20-25 minutes late to your appointment with the provider. 2. Tardiness:  What happens if I arrive only a few minutes after my scheduled appointment time? You will need to reschedule your appointment. The cutoff is your appointment time. This is why it is so important that you arrive at least 30 minutes before that appointment. If you have an appointment scheduled for 10:00 AM and you arrive at 10:01, you will be required to reschedule your appointment.  3. Plan ahead:  Always assume that you will encounter traffic on your way in. Plan for it. If you are dependent on a driver, make sure they understand these rules and the need to arrive early. 4. Other appointments and responsibilities:  Avoid scheduling any other appointments before or after your pain clinic appointments.  5. Be prepared:  Write down everything that you need to discuss with your healthcare provider and give this information to the admitting nurse. Write down the medications that you will need refilled. Bring your pills and bottles (even the empty ones), to all of your appointments, except for those where a procedure is scheduled. 6. No children or pets:  Find someone to take care of them. It is not appropriate to bring them in. 7. Scheduling changes:  We request  "advanced notification" of any changes or   cancellations. 8. Advanced notification:  Defined as a time period of more than 24 hours prior to the originally scheduled appointment. This allows for the appointment to be offered to other patients. 9. Rescheduling:  When a visit is rescheduled, it will require the cancellation of the original appointment. For this reason they both fall within the category of "Cancellations".  10. Cancellations:  They require advanced notification. Any cancellation less than 24 hours before the  appointment will be recorded as a "No Show". 11. No Show:  Defined as an unkept appointment where the patient failed to notify or declare to the practice their intention or inability to keep the appointment.  Corrective process for repeat offenders:  1. Tardiness: Three (3) episodes of rescheduling due to late arrivals will be recorded as one (1) "No Show". 2. Cancellation or reschedule: Three (3) cancellations or rescheduling will be recorded as one (1) "No Show". 3. "No Shows": Three (3) "No Shows" within a 12 month period will result in discharge from the practice. ____________________________________________________________________________________________   BMI Assessment: Estimated body mass index is 43.71 kg/m as calculated from the following:   Height as of this encounter: 5\' 2"  (1.575 m).   Weight as of this encounter: 239 lb (108.4 kg).  BMI interpretation table: BMI level Category Range association with higher incidence of chronic pain  <18 kg/m2 Underweight   18.5-24.9 kg/m2 Ideal body weight   25-29.9 kg/m2 Overweight Increased incidence by 20%  30-34.9 kg/m2 Obese (Class I) Increased incidence by 68%  35-39.9 kg/m2 Severe obesity (Class II) Increased incidence by 136%  >40 kg/m2 Extreme obesity (Class III) Increased incidence by 254%   Patient's current BMI Ideal Body weight  Body mass index is 43.71 kg/m. Ideal body weight: 50.1 kg (110 lb 7.2  oz) Adjusted ideal body weight: 73.4 kg (161 lb 13.9 oz)   BMI Readings from Last 4 Encounters:  02/13/18 43.71 kg/m  01/13/18 41.70 kg/m  12/31/17 42.60 kg/m  12/19/17 42.43 kg/m   Wt Readings from Last 4 Encounters:  02/13/18 239 lb (108.4 kg)  01/13/18 228 lb (103.4 kg)  12/31/17 232 lb 15 oz (105.7 kg)  12/19/17 232 lb (105.2 kg)   Oxycodone - apap  5 - 325 mg x 2 months to begin filling today escribed to pharmacy.  Diclofenac and lidocaine ointment escribed.   ____________________________________________________________________________________________  Blood Thinners  Recommended Time Interval Before and After Neuraxial Block or Catheter Removal  Drug (Generic) Brand Name Time Before Time After Comments  Abciximab Reopro 15 days 2 hours   Alteplase Activase 10 days 10 days   Apixaban Eliquis 3 days 6 hours   Aspirin > 325 mg Goody Powders/Excedrin 11 days  (Usually not stopped)  Aspirin ? 81 mg  7 days  (Usually not stopped)  Cholesterol Medication Lipitor 4 days    Cilostazol Pletal 3 days 5 hours   Clopidogrel Plavix 7-10 days 2 hours   Dabigatran Pradaxa 5 days 6 hours   Delteparin Fragmin 24 hours 4 hours   Dipyridamole + ASA Aggrenox 11days 2 hours   Enoxaparin  Lovenox 24 hours 4 hours   Eptifibatide Integrillin 8 hours 2 hours   Fish oil  4 days    Fondaparinux  Arixtra 72 hours 12 hours   Garlic supplements  7 days    Ginkgo biloba  36 hours    Ginseng  24 hours    Heparin (IV)  4 hours 2 hours   Heparin (Vashon)  12 hours 2 hours  Hydroxychloroquine Plaquenil 11 days    LMW Heparin  24 hours    LMWH  24 hours    NSAIDs  3 days  (Usually not stopped)  Prasugrel Effient 7-10 days 6 hours   Reteplase Retavase 10 days 10 days   Rivaroxaban Xarelto 3 days 6 hours   Streptokinase Streptase 10 days 10 days   Tenecteplase TNKase 10 days 10 days   Thrombolytics  10 days  10 days Avoid x 10 days after inj.  Ticagrelor Brilinta 5-7 days 6 hours    Ticlodipine Ticlid 10-14 days 2 hours   Tinzaparin Innohep 24 hours 4 hours   Tirofiban Aggrastat 8 hours 2 hours   Vitamin E  4 days    Warfarin Coumadin 5 days 2 hours   ____________________________________________________________________________________________ ____________________________________________________________________________________________  Blood Thinners  Recommended Time Interval Before and After Neuraxial Block or Catheter Removal  Drug (Generic) Brand Name Time Before Time After Comments  Abciximab Reopro 15 days 2 hours   Alteplase Activase 10 days 10 days   Apixaban Eliquis 3 days 6 hours   Aspirin > 325 mg Goody Powders/Excedrin 11 days  (Usually not stopped)  Aspirin ? 81 mg  7 days  (Usually not stopped)  Cholesterol Medication Lipitor 4 days    Cilostazol Pletal 3 days 5 hours   Clopidogrel Plavix 7-10 days 2 hours   Dabigatran Pradaxa 5 days 6 hours   Delteparin Fragmin 24 hours 4 hours   Dipyridamole + ASA Aggrenox 11days 2 hours   Enoxaparin  Lovenox 24 hours 4 hours   Eptifibatide Integrillin 8 hours 2 hours   Fish oil  4 days    Fondaparinux  Arixtra 72 hours 12 hours   Garlic supplements  7 days    Ginkgo biloba  36 hours    Ginseng  24 hours    Heparin (IV)  4 hours 2 hours   Heparin (Clarence)  12 hours 2 hours   Hydroxychloroquine Plaquenil 11 days    LMW Heparin  24 hours    LMWH  24 hours    NSAIDs  3 days  (Usually not stopped)  Prasugrel Effient 7-10 days 6 hours   Reteplase Retavase 10 days 10 days   Rivaroxaban Xarelto 3 days 6 hours   Streptokinase Streptase 10 days 10 days   Tenecteplase TNKase 10 days 10 days   Thrombolytics  10 days  10 days Avoid x 10 days after inj.  Ticagrelor Brilinta 5-7 days 6 hours   Ticlodipine Ticlid 10-14 days 2 hours   Tinzaparin Innohep 24 hours 4 hours   Tirofiban Aggrastat 8 hours 2 hours   Vitamin E  4 days    Warfarin Coumadin 5 days 2 hours    ____________________________________________________________________________________________

## 2018-02-13 NOTE — Addendum Note (Signed)
Addended by: Barbette Merino on: 02/13/2018 03:07 PM   Modules accepted: Level of Service

## 2018-02-13 NOTE — Progress Notes (Signed)
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 HEART CATH AND CORONARY ANGIOGRAPHY (Left, 11/08/2016); and Colonoscopy. Family: family history includes Arthritis in her father and mother; Asthma in her father; COPD in her mother; Cancer in her mother; Depression in her father; Diabetes in her mother; Drug abuse in her father; Heart disease in her mother; Hypertension in her father and mother; Kidney disease in her mother; Stroke in her father; Vision loss in her father and mother.  Constitutional Exam  General appearance: Well nourished, well developed, and well hydrated. In no apparent acute distress Vitals:   02/13/18 1005  BP: (!) 147/88  Pulse: 82  Resp: 16  Temp: (!) 97.5 F (36.4 C)  TempSrc: Oral  SpO2: 95%  Weight: 239 lb (108.4 kg)  Height: 5'  2" (1.575 m)  Psych/Mental status: Alert, oriented x 3 (person, place, & time)       Eyes: PERLA Respiratory: No evidence of acute respiratory distress  Cervical Spine Area Exam  Skin & Axial Inspection: No masses, redness, edema, swelling, or associated skin lesions Alignment: Symmetrical Functional ROM: Unrestricted ROM      Stability: No instability detected Muscle Tone/Strength: Functionally intact. No obvious neuro-muscular anomalies detected. Sensory (Neurological): Unimpaired Palpation: No palpable anomalies              Upper Extremity (UE) Exam    Side: Right upper extremity  Side: Left upper extremity  Skin & Extremity Inspection: Skin color, temperature, and hair growth are WNL. No peripheral edema or cyanosis. No masses, redness, swelling, asymmetry, or associated skin lesions. No contractures.  Skin & Extremity Inspection: Skin color, temperature, and hair growth are WNL. No peripheral edema or cyanosis. No masses, redness, swelling, asymmetry, or associated skin lesions. No contractures.  Functional ROM: Unrestricted ROM          Functional ROM: Unrestricted ROM          Muscle Tone/Strength: Functionally intact. No obvious neuro-muscular anomalies detected.  Muscle Tone/Strength: Functionally intact. No obvious neuro-muscular anomalies detected.  Sensory (Neurological): Unimpaired          Sensory (Neurological): Unimpaired          Palpation: No palpable anomalies              Palpation: No palpable anomalies              Provocative Test(s):  Phalen's test: deferred Tinel's test: deferred Apley's scratch test (touch opposite shoulder):  Action 1 (Across chest): deferred Action 2 (Overhead): deferred Action 3 (LB reach): deferred   Provocative Test(s):  Phalen's test: deferred Tinel's test: deferred Apley's scratch test (touch opposite shoulder):  Action 1 (Across chest): deferred Action 2 (Overhead): deferred Action 3 (LB reach): deferred    Thoracic Spine Area  Exam  Skin & Axial Inspection: No masses, redness, or swelling Alignment: Symmetrical Functional ROM: Unrestricted ROM Stability: No instability detected Muscle Tone/Strength: Functionally intact. No obvious neuro-muscular anomalies detected. Sensory (Neurological): Unimpaired Muscle strength & Tone: No palpable anomalies  Lumbar Spine Area Exam  Skin & Axial Inspection: No masses, redness, or swelling Alignment: Symmetrical Functional ROM: Unrestricted ROM       Stability: No instability detected Muscle Tone/Strength: Functionally intact. No obvious neuro-muscular anomalies detected. Sensory (Neurological): Unimpaired  Palpation: No palpable anomalies       Provocative Tests: Hyperextension/rotation test: deferred today       Lumbar quadrant test (Kemp's test): deferred today       Lateral bending test: deferred today       Patrick's Maneuver: deferred today                   FABER test: deferred today                   S-I anterior distraction/compression test: deferred today         S-I lateral compression test: deferred today         S-I Thigh-thrust test: deferred today         S-I Gaenslen's test: deferred today          Gait & Posture Assessment  Ambulation: Unassisted Gait: Relatively normal for age and body habitus Posture: WNL   Lower Extremity Exam    Side: Right lower extremity  Side: Left lower extremity  Stability: No instability observed          Stability: No instability observed          Skin & Extremity Inspection: Skin color, temperature, and hair growth are WNL. No peripheral edema or cyanosis. No masses, redness, swelling, asymmetry, or associated skin lesions. No contractures.  Skin & Extremity Inspection: Skin color, temperature, and hair growth are WNL. No peripheral edema or cyanosis. No masses, redness, swelling, asymmetry, or associated skin lesions. No contractures.  Functional ROM: Unrestricted ROM                  Functional ROM: Unrestricted ROM                   Muscle Tone/Strength: Functionally intact. No obvious neuro-muscular anomalies detected.  Muscle Tone/Strength: Functionally intact. No obvious neuro-muscular anomalies detected.  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 Diagnoses of Vitamin D deficiency, Osteoarthritis, and Chronic pain syndrome were also pertinent to this visit.  Status Diagnosis  Persistent Controlled Controlled 1. Spondylosis without myelopathy or radiculopathy, lumbosacral region   2. Vitamin D deficiency   3. Osteoarthritis   4. Chronic pain syndrome     Problems updated and reviewed during this visit: No problems updated. Plan of Care  Pharmacotherapy (Medications Ordered): No orders of the defined types were placed in this encounter.  New Prescriptions   No medications on file   Medications administered today: Yael Coppess. Balthazar had no medications administered during this visit. Lab-work, procedure(s), and/or referral(s): No orders of the defined types were placed in this encounter.  Imaging and/or referral(s): None  Interventional therapies: Planned, scheduled, and/or pending:   Not at this time.   Provider-requested follow-up: Return in about 4 weeks (around 03/13/2018) for MedMgmt.  Future Appointments  Date Time Provider [place]  03/13/2018 10:30 AM Milinda Pointer, MD [facility] None   Primary Care Physician: Ricardo Jericho, NP Location: [facility] Note by: Vevelyn Francois NP Date: 02/13/2018; Time: 10:32 AM  Pain Score Disclaimer: We use the NRS-11 scale. This is a self-reported, subjective measurement of pain severity with only modest accuracy. It is used primarily to  identify changes within a particular patient. It must be understood that outpatient pain  scales are significantly less accurate that those used for research, where they can be applied under ideal controlled circumstances with minimal exposure to variables. In reality, the score is likely to be a combination of pain intensity and pain affect, where pain affect describes the degree of emotional arousal or changes in action readiness caused by the sensory experience of pain. Factors such as social and work situation, setting, emotional state, anxiety levels, expectation, and prior pain experience may influence pain perception and show large inter-individual differences that may also be affected by time variables.  Patient instructions provided during this appointment: Patient Instructions  ____________________________________________________________________________________________  Medication Rules  Applies to: All patients receiving prescriptions (written or electronic).  Pharmacy of record: Pharmacy where electronic prescriptions will be sent. If written prescriptions are taken to a different pharmacy, please inform the nursing staff. The pharmacy listed in the electronic medical record should be the one where you would like electronic prescriptions to be sent.  Prescription refills: Only during scheduled appointments. Applies to both, written and electronic prescriptions.  NOTE: The following applies primarily to controlled substances (Opioid* Pain Medications).   Patient's responsibilities: 1. Pain Pills: Bring all pain pills to every appointment (except for procedure appointments). 2. Pill Bottles: Bring pills in original pharmacy bottle. Always bring newest bottle. Bring bottle, even if empty. 3. Medication refills: You are responsible for knowing and keeping track of what medications you need refilled. The day before your appointment, write a list of all prescriptions that need to be refilled.  Bring that list to your appointment and give it to the admitting nurse. Prescriptions will be written only during appointments. If you forget a medication, it will not be "Called in", "Faxed", or "electronically sent". You will need to get another appointment to get these prescribed. 4. Prescription Accuracy: You are responsible for carefully inspecting your prescriptions before leaving our office. Have the discharge nurse carefully go over each prescription with you, before taking them home. Make sure that your name is accurately spelled, that your address is correct. Check the name and dose of your medication to make sure it is accurate. Check the number of pills, and the written instructions to make sure they are clear and accurate. Make sure that you are given enough medication to last until your next medication refill appointment. 5. Taking Medication: Take medication as prescribed. Never take more pills than instructed. Never take medication more frequently than prescribed. Taking less pills or less frequently is permitted and encouraged, when it comes to controlled substances (written prescriptions).  6. Inform other Doctors: Always inform, all of your healthcare providers, of all the medications you take. 7. Pain Medication from other Providers: You are not allowed to accept any additional pain medication from any other Doctor or Healthcare provider. There are two exceptions to this rule. (see below) In the event that you require additional pain medication, you are responsible for notifying us, as stated below. 8. Medication Agreement: You are responsible for carefully reading and following our Medication Agreement. This must be signed before receiving any prescriptions from our practice. Safely store a copy of your signed Agreement. Violations to the Agreement will result in no further prescriptions. (Additional copies of our Medication Agreement are available upon request.) 9. Laws, Rules, &  Regulations: All patients are expected to follow all Federal and [employer], [employer], Rules, [employer]. Ignorance of the Laws does not constitute a valid excuse. The use of any illegal substances is prohibited. 10. Adopted CDC guidelines &  recommendations: Target dosing levels will be at or below 60 MME/day. Use of benzodiazepines** is not recommended.  Exceptions: There are only two exceptions to the rule of not receiving pain medications from other Healthcare Providers. 1. Exception #1 (Emergencies): In the event of an emergency (i.e.: accident requiring emergency care), you are allowed to receive additional pain medication. However, you are responsible for: As soon as you are able, call our office [phone], at any time of the day or night, and leave a message stating your name, the date and nature of the emergency, and the name and dose of the medication prescribed. In the event that your call is answered by a member of our staff, make sure to document and save the date, time, and the name of the person that took your information.  2. Exception #2 (Planned Surgery): In the event that you are scheduled by another doctor or dentist to have any type of surgery or procedure, you are allowed (for a period no longer than 30 days), to receive additional pain medication, for the acute post-op pain. However, in this case, you are responsible for picking up a copy of our "Post-op Pain Management for Surgeons" handout, and giving it to your surgeon or dentist. This document is available at our office, and does not require an appointment to obtain it. Simply go to our office during business hours (Monday-Thursday from 8:00 AM to 4:00 PM) (Friday 8:00 AM to 12:00 Noon) or if you have a scheduled appointment with [place], prior to your surgery, and ask for it by name. In addition, you will need to provide [place] with your name, name of your surgeon, type of surgery, and date of procedure or surgery.  *Opioid  medications include: morphine, codeine, oxycodone, oxymorphone, hydrocodone, hydromorphone, meperidine, tramadol, tapentadol, buprenorphine, fentanyl, methadone. **Benzodiazepine medications include: diazepam (Valium), alprazolam (Xanax), clonazepam (Klonopine), lorazepam (Ativan), clorazepate (Tranxene), chlordiazepoxide (Librium), estazolam (Prosom), oxazepam (Serax), temazepam (Restoril), triazolam (Halcion) (Last updated: 07/18/2017) ____________________________________________________________________________________________   ____________________________________________________________________________________________  Appointment Policy Summary  It is our goal and responsibility to provide the medical community with assistance in the evaluation and management of patients with chronic pain. Unfortunately our resources are limited. Because we do not have an unlimited amount of time, or available appointments, we are required to closely monitor and manage their use. The following rules exist to maximize their use:  Patient's responsibilities: 1. Punctuality:  At what time should I arrive? You should be physically present in our office 30 minutes before your scheduled appointment. Your scheduled appointment is with your assigned healthcare provider. However, it takes 5-10 minutes to be "checked-in", and another 15 minutes for the nurses to do the admission. If you arrive to our office at the time you were given for your appointment, you will end up being at least 20-25 minutes late to your appointment with the provider. 2. Tardiness:  What happens if I arrive only a few minutes after my scheduled appointment time? You will need to reschedule your appointment. The cutoff is your appointment time. This is why it is so important that you arrive at least 30 minutes before that appointment. If you have an appointment scheduled for 10:00 AM and you arrive at 10:01, you will be required to reschedule your  appointment.  3. Plan ahead:  Always assume that you will encounter traffic on your way in. Plan for it. If you are dependent on a driver, make sure they understand these rules and the need to arrive early. 4. Other  appointments and responsibilities:  Avoid scheduling any other appointments before or after your pain clinic appointments.  5. Be prepared:  Write down everything that you need to discuss with your healthcare provider and give this information to the admitting nurse. Write down the medications that you will need refilled. Bring your pills and bottles (even the empty ones), to all of your appointments, except for those where a procedure is scheduled. 6. No children or pets:  Find someone to take care of them. It is not appropriate to bring them in. 7. Scheduling changes:  We request "advanced notification" of any changes or cancellations. 8. Advanced notification:  Defined as a time period of more than 24 hours prior to the originally scheduled appointment. This allows for the appointment to be offered to other patients. 9. Rescheduling:  When a visit is rescheduled, it will require the cancellation of the original appointment. For this reason they both fall within the category of "Cancellations".  10. Cancellations:  They require advanced notification. Any cancellation less than 24 hours before the  appointment will be recorded as a "No Show". 11. No Show:  Defined as an unkept appointment where the patient failed to notify or declare to the practice their intention or inability to keep the appointment.  Corrective process for repeat offenders:  1. Tardiness: Three (3) episodes of rescheduling due to late arrivals will be recorded as one (1) "No Show". 2. Cancellation or reschedule: Three (3) cancellations or rescheduling will be recorded as one (1) "No Show". 3. "No Shows": Three (3) "No Shows" within a 12 month period will result in discharge from the  practice. ____________________________________________________________________________________________

## 2018-02-18 ENCOUNTER — Telehealth: Payer: Self-pay | Admitting: *Deleted

## 2018-02-18 NOTE — Telephone Encounter (Signed)
Left voicemail for patient to return my call.  We have received medical clearance from [facility] for her to stop Plavix for 2 days prior to RF procedure on 03/13/18.  Dr Laban Emperor does not feel that this is long enough to prevent risk of bleeding with the procedure.  He has asked if I would contact [employer] back to see if a Lovenox Bridge could possibly be administered since this would only have to be d/c'd 24 hours prior to procedure. I have refaxed the medical clearance with additional info/request for lovenox bridge.

## 2018-02-25 ENCOUNTER — Telehealth: Payer: Self-pay | Admitting: *Deleted

## 2018-02-25 NOTE — Telephone Encounter (Signed)
Spoke with Pam from [facility] office, she states that Dr Gavin Potters was on call for her and has okayed the lovenox bridge x 7 days.  I have asked that they order this medication and have her start 7 days prior to procedure and stop the lovenox 24 hours prior to procedure.  Pam states she will have this medication ordered.    Called Maisa to give her information that I have received from [facility] office about stopping Plavix.  Voicemail left for her to return the call.

## 2018-02-28 ENCOUNTER — Telehealth: Payer: Self-pay

## 2018-02-28 NOTE — Telephone Encounter (Signed)
Called Carrie Gardner to notify her that it was approved to stop taking Plavix and to Start Lovenox 7 days before procedure. Instructed to follow the directions on prescription. Patient with understanding.

## 2018-03-11 ENCOUNTER — Ambulatory Visit: Payer: Medicare Other | Admitting: Pain Medicine

## 2018-03-13 ENCOUNTER — Encounter: Payer: Self-pay | Admitting: Pain Medicine

## 2018-03-13 ENCOUNTER — Ambulatory Visit (HOSPITAL_BASED_OUTPATIENT_CLINIC_OR_DEPARTMENT_OTHER): Payer: Medicare Other | Admitting: Pain Medicine

## 2018-03-13 ENCOUNTER — Other Ambulatory Visit: Payer: Self-pay

## 2018-03-13 ENCOUNTER — Ambulatory Visit
Admission: RE | Admit: 2018-03-13 | Discharge: 2018-03-13 | Disposition: A | Discharge status: Home / Self Care | Payer: Medicare Other | Source: Ambulatory Visit | Attending: Pain Medicine | Admitting: Pain Medicine

## 2018-03-13 VITALS — BP 158/75 | HR 83 | Temp 97.0°F | Resp 19 | Ht 62.0 in | Wt 238.0 lb

## 2018-03-13 DIAGNOSIS — M961 Postlaminectomy syndrome, not elsewhere classified: Secondary | ICD-10-CM

## 2018-03-13 DIAGNOSIS — Z9889 Other specified postprocedural states: Secondary | ICD-10-CM | POA: Diagnosis not present

## 2018-03-13 DIAGNOSIS — M4310 Spondylolisthesis, site unspecified: Secondary | ICD-10-CM

## 2018-03-13 DIAGNOSIS — M545 Low back pain, unspecified: Secondary | ICD-10-CM

## 2018-03-13 DIAGNOSIS — Z794 Long term (current) use of insulin: Secondary | ICD-10-CM | POA: Insufficient documentation

## 2018-03-13 DIAGNOSIS — Z90710 Acquired absence of both cervix and uterus: Secondary | ICD-10-CM | POA: Diagnosis not present

## 2018-03-13 DIAGNOSIS — M47816 Spondylosis without myelopathy or radiculopathy, lumbar region: Secondary | ICD-10-CM

## 2018-03-13 DIAGNOSIS — G8929 Other chronic pain: Secondary | ICD-10-CM | POA: Insufficient documentation

## 2018-03-13 DIAGNOSIS — M5136 Other intervertebral disc degeneration, lumbar region: Secondary | ICD-10-CM | POA: Insufficient documentation

## 2018-03-13 DIAGNOSIS — M47817 Spondylosis without myelopathy or radiculopathy, lumbosacral region: Secondary | ICD-10-CM

## 2018-03-13 DIAGNOSIS — M51369 Other intervertebral disc degeneration, lumbar region without mention of lumbar back pain or lower extremity pain: Secondary | ICD-10-CM

## 2018-03-13 DIAGNOSIS — Z7902 Long term (current) use of antithrombotics/antiplatelets: Secondary | ICD-10-CM | POA: Insufficient documentation

## 2018-03-13 DIAGNOSIS — Z7901 Long term (current) use of anticoagulants: Secondary | ICD-10-CM

## 2018-03-13 DIAGNOSIS — Z9049 Acquired absence of other specified parts of digestive tract: Secondary | ICD-10-CM | POA: Insufficient documentation

## 2018-03-13 DIAGNOSIS — Z888 Allergy status to other drugs, medicaments and biological substances status: Secondary | ICD-10-CM | POA: Insufficient documentation

## 2018-03-13 DIAGNOSIS — M4316 Spondylolisthesis, lumbar region: Secondary | ICD-10-CM | POA: Insufficient documentation

## 2018-03-13 DIAGNOSIS — G8918 Other acute postprocedural pain: Secondary | ICD-10-CM

## 2018-03-13 DIAGNOSIS — Z886 Allergy status to analgesic agent status: Secondary | ICD-10-CM | POA: Insufficient documentation

## 2018-03-13 MED ORDER — FENTANYL CITRATE (PF) 100 MCG/2ML IJ SOLN
25.0000 ug | INTRAMUSCULAR | Status: DC | PRN
  Administered 2018-03-13: 100 ug via INTRAVENOUS
  Filled 2018-03-13: qty 2

## 2018-03-13 MED ORDER — HYDROCODONE-ACETAMINOPHEN 5-325 MG PO TABS: 1.0000 | ORAL_TABLET | Freq: Three times a day (TID) | ORAL | 0 refills | Status: AC | PRN

## 2018-03-13 MED ORDER — ROPIVACAINE HCL 2 MG/ML IJ SOLN
9.0000 mL | Freq: Once | INTRAMUSCULAR | Status: AC
  Administered 2018-03-13: 9 mL via PERINEURAL
  Filled 2018-03-13: qty 10

## 2018-03-13 MED ORDER — MIDAZOLAM HCL 5 MG/5ML IJ SOLN
1.0000 mg | INTRAMUSCULAR | Status: DC | PRN
  Administered 2018-03-13: 3 mg via INTRAVENOUS
  Filled 2018-03-13: qty 5

## 2018-03-13 MED ORDER — TRIAMCINOLONE ACETONIDE 40 MG/ML IJ SUSP
40.0000 mg | Freq: Once | INTRAMUSCULAR | Status: AC
  Administered 2018-03-13: 40 mg
  Filled 2018-03-13: qty 1

## 2018-03-13 MED ORDER — LACTATED RINGERS IV SOLN
1000.0000 mL | Freq: Once | INTRAVENOUS | Status: AC
  Administered 2018-03-13: 1000 mL via INTRAVENOUS

## 2018-03-13 MED ORDER — LIDOCAINE HCL 2 % IJ SOLN
20.0000 mL | Freq: Once | INTRAMUSCULAR | Status: AC
  Administered 2018-03-13: 400 mg
  Filled 2018-03-13: qty 40

## 2018-03-13 NOTE — Patient Instructions (Addendum)
___________________________________________________________________________________________  Post-Radiofrequency (RF) Discharge Instructions  You have just completed a Radiofrequency Neurotomy.  The following instructions will provide you with information and guidelines for self-care upon discharge.  If at any time you have questions or concerns please call your physician. DO NOT DRIVE YOURSELF!!  Instructions:  Apply ice: Fill a plastic sandwich bag with crushed ice. Cover it with a small towel and apply to injection site. Apply for 15 minutes then remove x 15 minutes. Repeat sequence on day of procedure, until you go to bed. The purpose is to minimize swelling and discomfort after procedure.  Apply heat: Apply heat to procedure site starting the day following the procedure. The purpose is to treat any soreness and discomfort from the procedure.  Food intake: No eating limitations, unless stipulated above.  Nevertheless, if you have had sedation, you may experience some nausea.  In this case, it may be wise to wait at least two hours prior to resuming regular diet.  Physical activities: Keep activities to a minimum for the first 8 hours after the procedure. For the first 24 hours after the procedure, do not drive a motor vehicle,  Operate heavy machinery, power tools, or handle any weapons.  Consider walking with the use of an assistive device or accompanied by an adult for the first 24 hours.  Do not drink alcoholic beverages including beer.  Do not make any important decisions or sign any legal documents. Go home and rest today.  Resume activities tomorrow, as tolerated.  Use caution in moving about as you may experience mild leg weakness.  Use caution in cooking, use of household electrical appliances and climbing steps.  Driving: If you have received any sedation, you are not allowed to drive for 24 hours after your procedure.  Blood thinner: Restart your blood thinner 6 hours after your  procedure. (Only for those taking blood thinners)  Insulin: As soon as you can eat, you may resume your normal dosing schedule. (Only for those taking insulin)  Medications: May resume pre-procedure medications.  Do not take any drugs, other than what has been prescribed to you.  Infection prevention: Keep procedure site clean and dry.  Post-procedure Pain Diary: Extremely important that this be done correctly and accurately. Recorded information will be used to determine the next step in treatment.  Pain evaluated is that of treated area only. Do not include pain from an untreated area.  Complete every hour, on the hour, for the initial 8 hours. Set an alarm to help you do this part accurately.  Do not go to sleep and have it completed later. It will not be accurate.  Follow-up appointment: Keep your follow-up appointment after the procedure. Usually 2 weeks for most procedures. (6 weeks in the case of radiofrequency.) Bring you pain diary.   Expect:  From numbing medicine (AKA: Local Anesthetics): Numbness or decrease in pain.  Onset: Full effect within 15 minutes of injected.  Duration: It will depend on the type of local anesthetic used. On the average, 1 to 8 hours.   From steroids: Decrease in swelling or inflammation. Once inflammation is improved, relief of the pain will follow.  Onset of benefits: Depends on the amount of swelling present. The more swelling, the longer it will take for the benefits to be seen. In some cases, up to 10 days.  Duration: Steroids will stay in the system x 2 weeks. Duration of benefits will depend on multiple posibilities including persistent irritating factors.  From procedure: Some   discomfort is to be expected once the numbing medicine wears off. This should be minimal if ice and heat are applied as instructed.  Call if:  You experience numbness and weakness that gets worse with time, as opposed to wearing off.  He experience any unusual  bleeding, difficulty breathing, or loss of the ability to control your bowel and bladder. (This applies to Spinal procedures only)  You experience any redness, swelling, heat, red streaks, elevated temperature, fever, or any other signs of a possible infection.  Emergency Numbers:  Durning business hours (Monday - Thursday, 8:00 AM - 4:00 PM) (Friday, 9:00 AM - 12:00 Noon): [phone]  After hours: [phone] ____________________________________________________________________________________________ Restart your Plavix tomorrow.

## 2018-03-13 NOTE — Progress Notes (Signed)
Safety precautions to be maintained throughout the outpatient stay will include: orient to surroundings, keep bed in low position, maintain call bell within reach at all times, provide assistance with transfer out of bed and ambulation.  

## 2018-03-13 NOTE — Progress Notes (Signed)
Patient's Name: Carrie Gardner  MRN: [id number]  Referring Provider: Delano Metz, MD  DOB: September 07, 1950  PCP: Titus Mould, NP  DOS: 03/13/2018  Note by: Oswaldo Done, MD  Service setting: Ambulatory outpatient  Specialty: Interventional Pain Management  Patient type: Established  Location: [facility] (AMB) Pain Management Facility  Visit type: Interventional Procedure   Primary Reason for Visit: Interventional Pain Management Treatment. CC: Back Pain (lower)  Procedure:          Anesthesia, Analgesia, Anxiolysis:  Type: Thermal Lumbar Facet, Medial Branch Radiofrequency Ablation/Neurotomy  #1  Primary Purpose: Therapeutic Region: Posterolateral Lumbosacral Spine Level: L2, L3, L4, L5, & S1 Medial Branch Level(s). These levels will denervate the L3-4, L4-5, and the L5-S1 lumbar facet joints. Laterality: Right  Type: Moderate (Conscious) Sedation combined with Local Anesthesia Indication(s): Analgesia and Anxiety Route: Intravenous (IV) IV Access: Secured Sedation: Meaningful verbal contact was maintained at all times during the procedure  Local Anesthetic: Lidocaine 1-2%  Position: Prone   Indications: 1. Spondylosis without myelopathy or radiculopathy, lumbosacral region   2. Lumbar Facet Syndrome (Bilateral)   3. 12 mm Anterolisthesis of L4 over L5   4. DDD (degenerative disc disease), lumbar   5. Chronic low back pain (Secondary Area of Pain) (Bilateral) (R>L) w/o sciatica   6. Failed back surgical syndrome (x2)   7. History of iodine allergy    Carrie Gardner has been dealing with the above chronic pain for longer than three months and has either failed to respond, was unable to tolerate, or simply did not get enough benefit from other more conservative therapies including, but not limited to: 1. Over-the-counter medications 2. Anti-inflammatory medications 3. Muscle relaxants 4. Membrane stabilizers 5. Opioids 6. Physical therapy and/or chiropractic  manipulation 7. Modalities (Heat, ice, etc.) 8. Invasive techniques such as nerve blocks. Carrie Gardner has attained more than 50% relief of the pain from a series of diagnostic injections conducted in separate occasions.  Pain Score: Pre-procedure: 8 /10 Post-procedure: 0-No pain/10  Pre-op Assessment:  Carrie Gardner is a 67 y.o. (year old), female patient, seen today for interventional treatment. She  has a past surgical history that includes Spine surgery; Cholecystectomy; Abdominal hysterectomy; Pacemaker insertion; Laparoscopic gastric banding; Laparoscopic gastric band removal with laparoscopic gastric sleeve resection; LEFT HEART CATH AND CORONARY ANGIOGRAPHY (Left, 11/08/2016); and Colonoscopy. Carrie Gardner has a current medication list which includes the following prescription(s): gnp calcium 1200, cholecalciferol, clopidogrel, dapagliflozin propanediol, furosemide, glucagon, humalog kwikpen, insulin degludec, levothyroxine, lidocaine, linzess, metoprolol tartrate, omeprazole, oxycodone-acetaminophen, oxycodone-acetaminophen, rosuvastatin, hydrocodone-acetaminophen, and hydrocodone-acetaminophen, and the following Facility-Administered Medications: fentanyl, lactated ringers, and midazolam. Her primarily concern today is the Back Pain (lower)  Initial Vital Signs:  Pulse/HCG Rate: 83ECG Heart Rate: 94 Temp: 98.2 F (36.8 C) Resp: 16 BP: (!) 142/88 SpO2: 100 %  BMI: Estimated body mass index is 43.53 kg/m as calculated from the following:   Height as of this encounter: 5\' 2"  (1.575 m).   Weight as of this encounter: 238 lb (108 kg).  Risk Assessment: Allergies: Reviewed. She is allergic to ibuprofen; iodinated diagnostic agents; metformin; metformin and related; pregabalin; lisinopril; mobic [meloxicam]; and tramadol.  Allergy Precautions: No iodine containing solutions or radiological contrast used. Coagulopathies: Reviewed. None identified.  Blood-thinner therapy: None at this time Active  Infection(s): Reviewed. None identified. Carrie Gardner is afebrile  Site Confirmation: Carrie Gardner was asked to confirm the procedure and laterality before marking the site Procedure checklist: Completed Consent: Before the procedure and under the influence  of no sedative(s), amnesic(s), or anxiolytics, the patient was informed of the treatment options, risks and possible complications. To fulfill our ethical and legal obligations, as recommended by the American Medical Association's Code of Ethics, I have informed the patient of my clinical impression; the nature and purpose of the treatment or procedure; the risks, benefits, and possible complications of the intervention; the alternatives, including doing nothing; the risk(s) and benefit(s) of the alternative treatment(s) or procedure(s); and the risk(s) and benefit(s) of doing nothing. The patient was provided information about the general risks and possible complications associated with the procedure. These may include, but are not limited to: failure to achieve desired goals, infection, bleeding, organ or nerve damage, allergic reactions, paralysis, and death. In addition, the patient was informed of those risks and complications associated to Spine-related procedures, such as failure to decrease pain; infection (i.e.: Meningitis, epidural or intraspinal abscess); bleeding (i.e.: epidural hematoma, subarachnoid hemorrhage, or any other type of intraspinal or peri-dural bleeding); organ or nerve damage (i.e.: Any type of peripheral nerve, nerve root, or spinal cord injury) with subsequent damage to sensory, motor, and/or autonomic systems, resulting in permanent pain, numbness, and/or weakness of one or several areas of the body; allergic reactions; (i.e.: anaphylactic reaction); and/or death. Furthermore, the patient was informed of those risks and complications associated with the medications. These include, but are not limited to: allergic reactions (i.e.:  anaphylactic or anaphylactoid reaction(s)); adrenal axis suppression; blood sugar elevation that in diabetics may result in ketoacidosis or comma; water retention that in patients with history of congestive heart failure may result in shortness of breath, pulmonary edema, and decompensation with resultant heart failure; weight gain; swelling or edema; medication-induced neural toxicity; particulate matter embolism and blood vessel occlusion with resultant organ, and/or nervous system infarction; and/or aseptic necrosis of one or more joints. Finally, the patient was informed that Medicine is not an exact science; therefore, there is also the possibility of unforeseen or unpredictable risks and/or possible complications that may result in a catastrophic outcome. The patient indicated having understood very clearly. We have given the patient no guarantees and we have made no promises. Enough time was given to the patient to ask questions, all of which were answered to the patient's satisfaction. Carrie Gardner has indicated that she wanted to continue with the procedure. Attestation: I, the ordering provider, attest that I have discussed with the patient the benefits, risks, side-effects, alternatives, likelihood of achieving goals, and potential problems during recovery for the procedure that I have provided informed consent. Date  Time: 03/13/2018 10:50 AM  Pre-Procedure Preparation:  Monitoring: As per clinic protocol. Respiration, ETCO2, SpO2, BP, heart rate and rhythm monitor placed and checked for adequate function Safety Precautions: Patient was assessed for positional comfort and pressure points before starting the procedure. Time-out: I initiated and conducted the "Time-out" before starting the procedure, as per protocol. The patient was asked to participate by confirming the accuracy of the "Time Out" information. Verification of the correct person, site, and procedure were performed and confirmed by me,  the nursing staff, and the patient. "Time-out" conducted as per Joint Commission's Universal Protocol (UP.01.01.01). Time: 1227  Description of Procedure:          Laterality: Right Levels:  L2, L3, L4, L5, & S1 Medial Branch Level(s), at the L3-4, L4-5, and the L5-S1 lumbar facet joints. Area Prepped: Lumbosacral Prepping solution: ChloraPrep (2% chlorhexidine gluconate and 70% isopropyl alcohol) Safety Precautions: Aspiration looking for blood return was conducted prior to  all injections. At no point did we inject any substances, as a needle was being advanced. Before injecting, the patient was told to immediately notify me if she was experiencing any new onset of "ringing in the ears, or metallic taste in the mouth". No attempts were made at seeking any paresthesias. Safe injection practices and needle disposal techniques used. Medications properly checked for expiration dates. SDV (single dose vial) medications used. After the completion of the procedure, all disposable equipment used was discarded in the proper designated medical waste containers. Local Anesthesia: Protocol guidelines were followed. The patient was positioned over the fluoroscopy table. The area was prepped in the usual manner. The time-out was completed. The target area was identified using fluoroscopy. A 12-in long, straight, sterile hemostat was used with fluoroscopic guidance to locate the targets for each level blocked. Once located, the skin was marked with an approved surgical skin marker. Once all sites were marked, the skin (epidermis, dermis, and hypodermis), as well as deeper tissues (fat, connective tissue and muscle) were infiltrated with a small amount of a short-acting local anesthetic, loaded on a 10cc syringe with a 25G, 1.5-in  Needle. An appropriate amount of time was allowed for local anesthetics to take effect before proceeding to the next step. Local Anesthetic: Lidocaine 2.0% The unused portion of the local  anesthetic was discarded in the proper designated containers. Technical explanation of process:  Radiofrequency Ablation (RFA) L2 Medial Branch Nerve RFA: The target area for the L2 medial branch is at the junction of the postero-lateral aspect of the superior articular process and the superior, posterior, and medial edge of the transverse process of L3. Under fluoroscopic guidance, a Radiofrequency needle was inserted until contact was made with os over the superior postero-lateral aspect of the pedicular shadow (target area). Sensory and motor testing was conducted to properly adjust the position of the needle. Once satisfactory placement of the needle was achieved, the numbing solution was slowly injected after negative aspiration for blood. 2.0 mL of the nerve block solution was injected without difficulty or complication. After waiting for at least 3 minutes, the ablation was performed. Once completed, the needle was removed intact. L3 Medial Branch Nerve RFA: The target area for the L3 medial branch is at the junction of the postero-lateral aspect of the superior articular process and the superior, posterior, and medial edge of the transverse process of L4. Under fluoroscopic guidance, a Radiofrequency needle was inserted until contact was made with os over the superior postero-lateral aspect of the pedicular shadow (target area). Sensory and motor testing was conducted to properly adjust the position of the needle. Once satisfactory placement of the needle was achieved, the numbing solution was slowly injected after negative aspiration for blood. 2.0 mL of the nerve block solution was injected without difficulty or complication. After waiting for at least 3 minutes, the ablation was performed. Once completed, the needle was removed intact. L4 Medial Branch Nerve RFA: The target area for the L4 medial branch is at the junction of the postero-lateral aspect of the superior articular process and the  superior, posterior, and medial edge of the transverse process of L5. Under fluoroscopic guidance, a Radiofrequency needle was inserted until contact was made with os over the superior postero-lateral aspect of the pedicular shadow (target area). Sensory and motor testing was conducted to properly adjust the position of the needle. Once satisfactory placement of the needle was achieved, the numbing solution was slowly injected after negative aspiration for blood. 2.0 mL  of the nerve block solution was injected without difficulty or complication. After waiting for at least 3 minutes, the ablation was performed. Once completed, the needle was removed intact. L5 Medial Branch Nerve RFA: The target area for the L5 medial branch is at the junction of the postero-lateral aspect of the superior articular process of S1 and the superior, posterior, and medial edge of the sacral ala. Under fluoroscopic guidance, a Radiofrequency needle was inserted until contact was made with os over the superior postero-lateral aspect of the pedicular shadow (target area). Sensory and motor testing was conducted to properly adjust the position of the needle. Once satisfactory placement of the needle was achieved, the numbing solution was slowly injected after negative aspiration for blood. 2.0 mL of the nerve block solution was injected without difficulty or complication. After waiting for at least 3 minutes, the ablation was performed. Once completed, the needle was removed intact. S1 Medial Branch Nerve RFA: The target area for the S1 medial branch is located inferior to the junction of the S1 superior articular process and the L5 inferior articular process, posterior, inferior, and lateral to the 6 o'clock position of the L5-S1 facet joint, just superior to the S1 posterior foramen. Under fluoroscopic guidance, the Radiofrequency needle was advanced until contact was made with os over the Target area. Sensory and motor testing was  conducted to properly adjust the position of the needle. Once satisfactory placement of the needle was achieved, the numbing solution was slowly injected after negative aspiration for blood. 2.0 mL of the nerve block solution was injected without difficulty or complication. After waiting for at least 3 minutes, the ablation was performed. Once completed, the needle was removed intact. Radiofrequency lesioning (ablation):  Radiofrequency Generator: NeuroTherm [id number] Sensory Stimulation Parameters: 50 Hz was used to locate & identify the nerve, making sure that the needle was positioned such that there was no sensory stimulation below 0.3 V or above 0.7 V. Motor Stimulation Parameters: 2 Hz was used to evaluate the motor component. Care was taken not to lesion any nerves that demonstrated motor stimulation of the lower extremities at an output of less than 2.5 times that of the sensory threshold, or a maximum of 2.0 V. Lesioning Technique Parameters: Standard Radiofrequency settings. (Not bipolar or pulsed.) Temperature Settings: 80 degrees C Lesioning time: 60 seconds Intra-operative Compliance: Compliant Materials & Medications: Needle(s) (Electrode/Cannula) Type: Teflon-coated, curved tip, Radiofrequency needle(s) Gauge: 22G Length: 10cm Numbing solution: 0.2% PF-Ropivacaine + Triamcinolone (40 mg/mL) diluted to a final concentration of 4 mg of Triamcinolone/mL of Ropivacaine The unused portion of the solution was discarded in the proper designated containers.  Once the entire procedure was completed, the treated area was cleaned, making sure to leave some of the prepping solution back to take advantage of its long term bactericidal properties.  Illustration of the posterior view of the lumbar spine and the posterior neural structures. Laminae of L2 through S1 are labeled. DPRL5, dorsal primary ramus of L5; DPRS1, dorsal primary ramus of S1; DPR3, dorsal primary ramus of L3; FJ, facet  (zygapophyseal) joint L3-L4; I, inferior articular process of L4; LB1, lateral branch of dorsal primary ramus of L1; IAB, inferior articular branches from L3 medial branch (supplies L4-L5 facet joint); IBP, intermediate branch plexus; MB3, medial branch of dorsal primary ramus of L3; NR3, third lumbar nerve root; S, superior articular process of L5; SAB, superior articular branches from L4 (supplies L4-5 facet joint also); TP3, transverse process of L3.  Vitals:   03/13/18  1255 03/13/18 1301 03/13/18 1311 03/13/18 1320  BP: 132/69 132/70 135/75 (!) 153/75  Pulse:      Resp: 15 16 18 15   Temp:      TempSrc:      SpO2: 91% 94% 95% 100%  Weight:      Height:        Start Time: 1227 hrs. End Time: 1301 hrs.  Imaging Guidance (Spinal):          Type of Imaging Technique: Fluoroscopy Guidance (Spinal) Indication(s): Assistance in needle guidance and placement for procedures requiring needle placement in or near specific anatomical locations not easily accessible without such assistance. Exposure Time: Please see nurses notes. Contrast: None used. Fluoroscopic Guidance: I was personally present during the use of fluoroscopy. "Tunnel Vision Technique" used to obtain the best possible view of the target area. Parallax error corrected before commencing the procedure. "Direction-depth-direction" technique used to introduce the needle under continuous pulsed fluoroscopy. Once target was reached, antero-posterior, oblique, and lateral fluoroscopic projection used confirm needle placement in all planes. Images permanently stored in EMR. Interpretation: No contrast injected. I personally interpreted the imaging intraoperatively. Adequate needle placement confirmed in multiple planes. Permanent images saved into the patient's record.  Antibiotic Prophylaxis:   Anti-infectives (From admission, onward)   None     Indication(s): None identified  Post-operative Assessment:  Post-procedure Vital Signs:    Pulse/HCG Rate: 8371 Temp: 98.2 F (36.8 C) Resp: 15 BP: (!) 153/75 SpO2: 100 %  EBL: None  Complications: No immediate post-treatment complications observed by team, or reported by patient.  Note: The patient tolerated the entire procedure well. A repeat set of vitals were taken after the procedure and the patient was kept under observation following institutional policy, for this type of procedure. Post-procedural neurological assessment was performed, showing return to baseline, prior to discharge. The patient was provided with post-procedure discharge instructions, including a section on how to identify potential problems. Should any problems arise concerning this procedure, the patient was given instructions to immediately contact us, at any time, without hesitation. In any case, we plan to contact the patient by telephone for a follow-up status report regarding this interventional procedure.  Comments:  No additional relevant information.  Plan of Care    Imaging Orders     DG C-Arm 1-60 Min-No Report  Procedure Orders     Radiofrequency,Lumbar     Radiofrequency,Lumbar  Medications ordered for procedure: Meds ordered this encounter  Medications  . lidocaine (XYLOCAINE) 2 % (with pres) injection 400 mg  . midazolam (VERSED) 5 MG/5ML injection 1-2 mg    Make sure Flumazenil is available in the pyxis when using this medication. If oversedation occurs, administer 0.2 mg IV over 15 sec. If after 45 sec no response, administer 0.2 mg again over 1 min; may repeat at 1 min intervals; not to exceed 4 doses (1 mg)  . fentaNYL (SUBLIMAZE) injection 25-50 mcg    Make sure Narcan is available in the pyxis when using this medication. In the event of respiratory depression (RR< 8/min): Titrate NARCAN (naloxone) in increments of 0.1 to 0.2 mg IV at 2-3 minute intervals, until desired degree of reversal.  . lactated ringers infusion 1,000 mL  . ropivacaine (PF) 2 mg/mL (0.2%) (NAROPIN)  injection 9 mL  . triamcinolone acetonide (KENALOG-40) injection 40 mg  . HYDROcodone-acetaminophen (NORCO/VICODIN) 5-325 MG tablet    Sig: Take 1 tablet by mouth every 8 (eight) hours as needed for up to 7 days for severe  pain. Must last 7 days.    Dispense:  21 tablet    Refill:  0    For acute post-operative pain. Not to be refilled. Must last 7 days.  Marland Kitchen HYDROcodone-acetaminophen (NORCO/VICODIN) 5-325 MG tablet    Sig: Take 1 tablet by mouth every 8 (eight) hours as needed for up to 7 days for severe pain. Must last for 7 days.    Dispense:  21 tablet    Refill:  0    For acute post-operative pain. Not to be refilled. Must last 7 days.   Medications administered: We administered lidocaine, midazolam, fentaNYL, lactated ringers, ropivacaine (PF) 2 mg/mL (0.2%), and triamcinolone acetonide.  See the medical record for exact dosing, route, and time of administration.  Disposition: Discharge home  Discharge Date & Time: 03/13/2018; 1335 hrs.   Physician-requested Follow-up: Return for contralateral RFA (2 wks): (L) L-FCT RFA.  Future Appointments  Date Time Provider Department Center  04/10/2018  9:30 AM Barbette Merino, NP [facility] None   Primary Care Physician: Titus Mould, NP Location: [facility] Note by: Oswaldo Done, MD Date: 03/13/2018; Time: 1:21 PM  Disclaimer:  Medicine is not an Visual merchandiser. The only guarantee in medicine is that nothing is guaranteed. It is important to note that the decision to proceed with this intervention was based on the information collected from the patient. The Data and conclusions were drawn from the patient's questionnaire, the interview, and the physical examination. Because the information was provided in large part by the patient, it cannot be guaranteed that it has not been purposely or unconsciously manipulated. Every effort has been made to obtain as much relevant data as possible for this  evaluation. It is important to note that the conclusions that lead to this procedure are derived in large part from the available data. Always take into account that the treatment will also be dependent on availability of resources and existing treatment guidelines, considered by other Pain Management Practitioners as being common knowledge and practice, at the time of the intervention. For Medico-Legal purposes, it is also important to point out that variation in procedural techniques and pharmacological choices are the acceptable norm. The indications, contraindications, technique, and results of the above procedure should only be interpreted and judged by a Board-Certified Interventional Pain Specialist with extensive familiarity and expertise in the same exact procedure and technique.

## 2018-03-14 ENCOUNTER — Telehealth: Payer: Self-pay

## 2018-03-14 NOTE — Telephone Encounter (Signed)
Post procedure phone call.  LM 

## 2018-04-08 ENCOUNTER — Ambulatory Visit
Admission: RE | Admit: 2018-04-08 | Discharge: 2018-04-08 | Disposition: A | Discharge status: Home / Self Care | Payer: Medicare Other | Source: Ambulatory Visit | Attending: Pain Medicine | Admitting: Pain Medicine

## 2018-04-08 ENCOUNTER — Ambulatory Visit (HOSPITAL_BASED_OUTPATIENT_CLINIC_OR_DEPARTMENT_OTHER): Payer: Medicare Other | Admitting: Pain Medicine

## 2018-04-08 ENCOUNTER — Encounter: Payer: Self-pay | Admitting: Pain Medicine

## 2018-04-08 VITALS — BP 114/74 | HR 100 | Temp 98.0°F | Resp 15 | Ht 62.0 in | Wt 238.0 lb

## 2018-04-08 DIAGNOSIS — Z794 Long term (current) use of insulin: Secondary | ICD-10-CM | POA: Insufficient documentation

## 2018-04-08 DIAGNOSIS — Z7902 Long term (current) use of antithrombotics/antiplatelets: Secondary | ICD-10-CM | POA: Diagnosis not present

## 2018-04-08 DIAGNOSIS — G8929 Other chronic pain: Secondary | ICD-10-CM

## 2018-04-08 DIAGNOSIS — M961 Postlaminectomy syndrome, not elsewhere classified: Secondary | ICD-10-CM | POA: Diagnosis not present

## 2018-04-08 DIAGNOSIS — G8918 Other acute postprocedural pain: Secondary | ICD-10-CM

## 2018-04-08 DIAGNOSIS — M47816 Spondylosis without myelopathy or radiculopathy, lumbar region: Secondary | ICD-10-CM | POA: Insufficient documentation

## 2018-04-08 DIAGNOSIS — Z886 Allergy status to analgesic agent status: Secondary | ICD-10-CM | POA: Insufficient documentation

## 2018-04-08 DIAGNOSIS — Z6841 Body Mass Index (BMI) 40.0 and over, adult: Secondary | ICD-10-CM | POA: Insufficient documentation

## 2018-04-08 DIAGNOSIS — Z950 Presence of cardiac pacemaker: Secondary | ICD-10-CM | POA: Diagnosis not present

## 2018-04-08 DIAGNOSIS — M545 Low back pain: Secondary | ICD-10-CM | POA: Diagnosis present

## 2018-04-08 DIAGNOSIS — Z79899 Other long term (current) drug therapy: Secondary | ICD-10-CM | POA: Insufficient documentation

## 2018-04-08 DIAGNOSIS — M47817 Spondylosis without myelopathy or radiculopathy, lumbosacral region: Secondary | ICD-10-CM | POA: Insufficient documentation

## 2018-04-08 DIAGNOSIS — M5137 Other intervertebral disc degeneration, lumbosacral region: Secondary | ICD-10-CM | POA: Insufficient documentation

## 2018-04-08 DIAGNOSIS — Z888 Allergy status to other drugs, medicaments and biological substances status: Secondary | ICD-10-CM | POA: Diagnosis not present

## 2018-04-08 MED ORDER — LIDOCAINE HCL 2 % IJ SOLN
20.0000 mL | Freq: Once | INTRAMUSCULAR | Status: AC
  Administered 2018-04-08: 400 mg
  Filled 2018-04-08: qty 40

## 2018-04-08 MED ORDER — LACTATED RINGERS IV SOLN
1000.0000 mL | Freq: Once | INTRAVENOUS | Status: AC
  Administered 2018-04-08: 1000 mL via INTRAVENOUS

## 2018-04-08 MED ORDER — HYDROCODONE-ACETAMINOPHEN 5-325 MG PO TABS: 1.0000 | ORAL_TABLET | Freq: Three times a day (TID) | ORAL | 0 refills | Status: DC | PRN

## 2018-04-08 MED ORDER — MIDAZOLAM HCL 5 MG/5ML IJ SOLN
1.0000 mg | INTRAMUSCULAR | Status: DC | PRN
  Administered 2018-04-08: 1 mg via INTRAVENOUS
  Filled 2018-04-08: qty 5

## 2018-04-08 MED ORDER — FENTANYL CITRATE (PF) 100 MCG/2ML IJ SOLN
25.0000 ug | INTRAMUSCULAR | Status: DC | PRN
  Administered 2018-04-08: 50 ug via INTRAVENOUS

## 2018-04-08 MED ORDER — TRIAMCINOLONE ACETONIDE 40 MG/ML IJ SUSP
INTRAMUSCULAR | Status: AC
  Filled 2018-04-08: qty 1

## 2018-04-08 MED ORDER — FENTANYL CITRATE (PF) 100 MCG/2ML IJ SOLN
INTRAMUSCULAR | Status: AC
  Filled 2018-04-08: qty 2

## 2018-04-08 MED ORDER — ROPIVACAINE HCL 2 MG/ML IJ SOLN
9.0000 mL | Freq: Once | INTRAMUSCULAR | Status: AC
  Administered 2018-04-08: 9 mL via PERINEURAL

## 2018-04-08 MED ORDER — ROPIVACAINE HCL 2 MG/ML IJ SOLN
INTRAMUSCULAR | Status: AC
  Filled 2018-04-08: qty 10

## 2018-04-08 MED ORDER — TRIAMCINOLONE ACETONIDE 40 MG/ML IJ SUSP
40.0000 mg | Freq: Once | INTRAMUSCULAR | Status: AC
  Administered 2018-04-08: 40 mg

## 2018-04-08 NOTE — Patient Instructions (Signed)

## 2018-04-08 NOTE — Progress Notes (Signed)
Safety precautions to be maintained throughout the outpatient stay will include: orient to surroundings, keep bed in low position, maintain call bell within reach at all times, provide assistance with transfer out of bed and ambulation.  

## 2018-04-08 NOTE — Progress Notes (Signed)
Patient's Name: Carrie Gardner  MRN: [id number]  Referring Provider: Delano Metz, MD  DOB: 20-Feb-1951  PCP: Titus Mould, NP  DOS: 04/08/2018  Note by: Oswaldo Done, MD  Service setting: Ambulatory outpatient  Specialty: Interventional Pain Management  Patient type: Established  Location: [facility] (AMB) Pain Management Facility  Visit type: Interventional Procedure   Primary Reason for Visit: Interventional Pain Management Treatment. CC: Back Pain (left, lower)  Procedure:          Anesthesia, Analgesia, Anxiolysis:  Type: Thermal Lumbar Facet, Medial Branch Radiofrequency Ablation/Neurotomy  #1  Primary Purpose: Therapeutic Region: Posterolateral Lumbosacral Spine Level: L2, L3, L4, L5, & S1 Medial Branch Level(s). These levels will denervate the L3-4, L4-5, and the L5-S1 lumbar facet joints. Laterality: Left  Type: Moderate (Conscious) Sedation combined with Local Anesthesia Indication(s): Analgesia and Anxiety Route: Intravenous (IV) IV Access: Secured Sedation: Meaningful verbal contact was maintained at all times during the procedure  Local Anesthetic: Lidocaine 1-2%  Position: Prone   Indications: 1. Spondylosis without myelopathy or radiculopathy, lumbosacral region   2. Lumbar Facet Syndrome (Bilateral)   3. DDD (degenerative disc disease), lumbosacral   4. Failed back surgical syndrome (x2)   5. Chronic low back pain (Secondary Area of Pain) (Bilateral) (R>L) w/o sciatica   6. Acute postoperative pain    Carrie Gardner has been dealing with the above chronic pain for longer than three months and has either failed to respond, was unable to tolerate, or simply did not get enough benefit from other more conservative therapies including, but not limited to: 1. Over-the-counter medications 2. Anti-inflammatory medications 3. Muscle relaxants 4. Membrane stabilizers 5. Opioids 6. Physical therapy and/or chiropractic manipulation 7. Modalities (Heat, ice,  etc.) 8. Invasive techniques such as nerve blocks. Carrie Gardner has attained more than 50% relief of the pain from a series of diagnostic injections conducted in separate occasions.  Pain Score: Pre-procedure: 6 /10 Post-procedure: 0-No pain/10  Post-Procedure Assessment  03/13/2018 Procedure: Therapeutic right-sided lumbar facet RFA #1 under fluoroscopic guidance and IV sedation Pre-procedure pain score:  8/10 Post-procedure pain score: 0/10 (100% relief) Influential Factors: BMI: 43.53 kg/m Intra-procedural challenges: None observed.         Assessment challenges: None detected.              Reported side-effects: None.        Post-procedural adverse reactions or complications: None reported         Sedation: Sedation provided. When no sedatives are used, the analgesic levels obtained are directly associated to the effectiveness of the local anesthetics. However, when sedation is provided, the level of analgesia obtained during the initial 1 hour following the intervention, is believed to be the result of a combination of factors. These factors may include, but are not limited to: 1. The effectiveness of the local anesthetics used. 2. The effects of the analgesic(s) and/or anxiolytic(s) used. 3. The degree of discomfort experienced by the patient at the time of the procedure. 4. The patients ability and reliability in recalling and recording the events. 5. The presence and influence of possible secondary gains and/or psychosocial factors. Reported result: Relief experienced during the 1st hour after the procedure: 100 % (Ultra-Short Term Relief)            Interpretative annotation: Clinically appropriate result. Analgesia during this period is likely to be Local Anesthetic and/or IV Sedative (Analgesic/Anxiolytic) related.          Effects of local anesthetic: The analgesic  effects attained during this period are directly associated to the localized infiltration of local anesthetics and  therefore cary significant diagnostic value as to the etiological location, or anatomical origin, of the pain. Expected duration of relief is directly dependent on the pharmacodynamics of the local anesthetic used. Long-acting (4-6 hours) anesthetics used.  Reported result: Relief during the next 4 to 6 hour after the procedure: 25 % (Short-Term Relief)            Interpretative annotation: Clinically appropriate result. Analgesia during this period is likely to be Local Anesthetic-related.          Long-term benefit: Defined as the period of time past the expected duration of local anesthetics (1 hour for short-acting and 4-6 hours for long-acting). With the possible exception of prolonged sympathetic blockade from the local anesthetics, benefits during this period are typically attributed to, or associated with, other factors such as analgesic sensory neuropraxia, antiinflammatory effects, or beneficial biochemical changes provided by agents other than the local anesthetics.  Reported result: Extended relief following procedure: 50 % (Long-Term Relief)            Interpretative annotation: Clinically possible results. Good relief. No permanent benefit expected. Inflammation plays a part in the etiology to the pain. Benefit could signal adequate RF ablation.  Current benefits: Defined as reported results that persistent at this point in time.   Analgesia: >50 % Ms. Beranek reports improvement of axial symptoms. Function: Somewhat improved ROM: Somewhat improved Interpretative annotation: Ongoing benefit. Therapeutic benefit observed. Adequate RF ablation. Benefit could signal adequate RF ablation.  Interpretation: Results would suggest adequate radiofrequency ablation.                  Plan:  Proceed with contralateral RFA today                Pre-op Assessment:  Carrie Gardner is a 67 y.o. (year old), female patient, seen today for interventional treatment. She  has a past surgical history that  includes Spine surgery; Cholecystectomy; Abdominal hysterectomy; Pacemaker insertion; Laparoscopic gastric banding; Laparoscopic gastric band removal with laparoscopic gastric sleeve resection; LEFT HEART CATH AND CORONARY ANGIOGRAPHY (Left, 11/08/2016); and Colonoscopy. Ms. Casasanta has a current medication list which includes the following prescription(s): gnp calcium 1200, cholecalciferol, clopidogrel, dapagliflozin propanediol, furosemide, glucagon, humalog kwikpen, insulin degludec, levothyroxine, lidocaine, linzess, metoprolol tartrate, omeprazole, oxycodone-acetaminophen, rosuvastatin, hydrocodone-acetaminophen, hydrocodone-acetaminophen, and oxycodone-acetaminophen, and the following Facility-Administered Medications: fentanyl and midazolam. Her primarily concern today is the Back Pain (left, lower)  For some reason, the patient did not stop her Plavix for 7 days as she was instructed.  She indicates having stopped it for approximately 3 to 4 days.  Today the patient was given the option of going ahead with the radiofrequency or rescheduling the procedure.  She was informed of the increased risk of bleeding and what that could mean in the area that we are treating.  After careful consideration, the patient has decided to proceed with it.  Because we intend to stay away from the spinal canal, the risk of spinal hematomas and cord compression is not as high as if we were doing either a spinal or epidural.  Initial Vital Signs:  Pulse/HCG Rate: 100ECG Heart Rate: (!) 110 Temp: 98.2 F (36.8 C) Resp: 16 BP: 120/74 SpO2: 96 %  BMI: Estimated body mass index is 43.53 kg/m as calculated from the following: Unfortunately, there have been no advances in the patient's BMI and she continues to be morbidly obese  with a BMI over 40 which in turn is associated with a 254% incidence of chronic pain associated to osteoarthritis.   Height as of this encounter: 5\' 2"  (1.575 m).   Weight as of this encounter: 238 lb  (108 kg).  Risk Assessment: Allergies: Reviewed. She is allergic to ibuprofen; iodinated diagnostic agents; metformin; metformin and related; pregabalin; lisinopril; mobic [meloxicam]; and tramadol.  Allergy Precautions: None required Coagulopathies: Reviewed. None identified.  Blood-thinner therapy: None at this time Active Infection(s): Reviewed. None identified. Ms. Landing is afebrile  Site Confirmation: Ms. Madrigal was asked to confirm the procedure and laterality before marking the site Procedure checklist: Completed Consent: Before the procedure and under the influence of no sedative(s), amnesic(s), or anxiolytics, the patient was informed of the treatment options, risks and possible complications. To fulfill our ethical and legal obligations, as recommended by the American Medical Association's Code of Ethics, I have informed the patient of my clinical impression; the nature and purpose of the treatment or procedure; the risks, benefits, and possible complications of the intervention; the alternatives, including doing nothing; the risk(s) and benefit(s) of the alternative treatment(s) or procedure(s); and the risk(s) and benefit(s) of doing nothing. The patient was provided information about the general risks and possible complications associated with the procedure. These may include, but are not limited to: failure to achieve desired goals, infection, bleeding, organ or nerve damage, allergic reactions, paralysis, and death. In addition, the patient was informed of those risks and complications associated to Spine-related procedures, such as failure to decrease pain; infection (i.e.: Meningitis, epidural or intraspinal abscess); bleeding (i.e.: epidural hematoma, subarachnoid hemorrhage, or any other type of intraspinal or peri-dural bleeding); organ or nerve damage (i.e.: Any type of peripheral nerve, nerve root, or spinal cord injury) with subsequent damage to sensory, motor, and/or autonomic  systems, resulting in permanent pain, numbness, and/or weakness of one or several areas of the body; allergic reactions; (i.e.: anaphylactic reaction); and/or death. Furthermore, the patient was informed of those risks and complications associated with the medications. These include, but are not limited to: allergic reactions (i.e.: anaphylactic or anaphylactoid reaction(s)); adrenal axis suppression; blood sugar elevation that in diabetics may result in ketoacidosis or comma; water retention that in patients with history of congestive heart failure may result in shortness of breath, pulmonary edema, and decompensation with resultant heart failure; weight gain; swelling or edema; medication-induced neural toxicity; particulate matter embolism and blood vessel occlusion with resultant organ, and/or nervous system infarction; and/or aseptic necrosis of one or more joints. Finally, the patient was informed that Medicine is not an exact science; therefore, there is also the possibility of unforeseen or unpredictable risks and/or possible complications that may result in a catastrophic outcome. The patient indicated having understood very clearly. We have given the patient no guarantees and we have made no promises. Enough time was given to the patient to ask questions, all of which were answered to the patient's satisfaction. Ms. Battiste has indicated that she wanted to continue with the procedure. Attestation: I, the ordering provider, attest that I have discussed with the patient the benefits, risks, side-effects, alternatives, likelihood of achieving goals, and potential problems during recovery for the procedure that I have provided informed consent. Date  Time: 04/08/2018  1:12 PM  Pre-Procedure Preparation:  Monitoring: As per clinic protocol. Respiration, ETCO2, SpO2, BP, heart rate and rhythm monitor placed and checked for adequate function Safety Precautions: Patient was assessed for positional comfort  and pressure points before starting the procedure.  Time-out: I initiated and conducted the "Time-out" before starting the procedure, as per protocol. The patient was asked to participate by confirming the accuracy of the "Time Out" information. Verification of the correct person, site, and procedure were performed and confirmed by me, the nursing staff, and the patient. "Time-out" conducted as per Joint Commission's Universal Protocol (UP.01.01.01). Time: 1400  Description of Procedure:          Laterality: Left Levels:  L2, L3, L4, L5, & S1 Medial Branch Level(s), at the L3-4, L4-5, and the L5-S1 lumbar facet joints. Area Prepped: Lumbosacral Prepping solution: ChloraPrep (2% chlorhexidine gluconate and 70% isopropyl alcohol) Safety Precautions: Aspiration looking for blood return was conducted prior to all injections. At no point did we inject any substances, as a needle was being advanced. Before injecting, the patient was told to immediately notify me if she was experiencing any new onset of "ringing in the ears, or metallic taste in the mouth". No attempts were made at seeking any paresthesias. Safe injection practices and needle disposal techniques used. Medications properly checked for expiration dates. SDV (single dose vial) medications used. After the completion of the procedure, all disposable equipment used was discarded in the proper designated medical waste containers. Local Anesthesia: Protocol guidelines were followed. The patient was positioned over the fluoroscopy table. The area was prepped in the usual manner. The time-out was completed. The target area was identified using fluoroscopy. A 12-in long, straight, sterile hemostat was used with fluoroscopic guidance to locate the targets for each level blocked. Once located, the skin was marked with an approved surgical skin marker. Once all sites were marked, the skin (epidermis, dermis, and hypodermis), as well as deeper tissues (fat,  connective tissue and muscle) were infiltrated with a small amount of a short-acting local anesthetic, loaded on a 10cc syringe with a 25G, 1.5-in  Needle. An appropriate amount of time was allowed for local anesthetics to take effect before proceeding to the next step. Local Anesthetic: Lidocaine 2.0% The unused portion of the local anesthetic was discarded in the proper designated containers. Technical explanation of process:  Radiofrequency Ablation (RFA) L2 Medial Branch Nerve RFA: The target area for the L2 medial branch is at the junction of the postero-lateral aspect of the superior articular process and the superior, posterior, and medial edge of the transverse process of L3. Under fluoroscopic guidance, a Radiofrequency needle was inserted until contact was made with os over the superior postero-lateral aspect of the pedicular shadow (target area). Sensory and motor testing was conducted to properly adjust the position of the needle. Once satisfactory placement of the needle was achieved, the numbing solution was slowly injected after negative aspiration for blood. 2.0 mL of the nerve block solution was injected without difficulty or complication. After waiting for at least 3 minutes, the ablation was performed. Once completed, the needle was removed intact. L3 Medial Branch Nerve RFA: The target area for the L3 medial branch is at the junction of the postero-lateral aspect of the superior articular process and the superior, posterior, and medial edge of the transverse process of L4. Under fluoroscopic guidance, a Radiofrequency needle was inserted until contact was made with os over the superior postero-lateral aspect of the pedicular shadow (target area). Sensory and motor testing was conducted to properly adjust the position of the needle. Once satisfactory placement of the needle was achieved, the numbing solution was slowly injected after negative aspiration for blood. 2.0 mL of the nerve block  solution was injected without  difficulty or complication. After waiting for at least 3 minutes, the ablation was performed. Once completed, the needle was removed intact. L4 Medial Branch Nerve RFA: The target area for the L4 medial branch is at the junction of the postero-lateral aspect of the superior articular process and the superior, posterior, and medial edge of the transverse process of L5. Under fluoroscopic guidance, a Radiofrequency needle was inserted until contact was made with os over the superior postero-lateral aspect of the pedicular shadow (target area). Sensory and motor testing was conducted to properly adjust the position of the needle. Once satisfactory placement of the needle was achieved, the numbing solution was slowly injected after negative aspiration for blood. 2.0 mL of the nerve block solution was injected without difficulty or complication. After waiting for at least 3 minutes, the ablation was performed. Once completed, the needle was removed intact. L5 Medial Branch Nerve RFA: The target area for the L5 medial branch is at the junction of the postero-lateral aspect of the superior articular process of S1 and the superior, posterior, and medial edge of the sacral ala. Under fluoroscopic guidance, a Radiofrequency needle was inserted until contact was made with os over the superior postero-lateral aspect of the pedicular shadow (target area). Sensory and motor testing was conducted to properly adjust the position of the needle. Once satisfactory placement of the needle was achieved, the numbing solution was slowly injected after negative aspiration for blood. 2.0 mL of the nerve block solution was injected without difficulty or complication. After waiting for at least 3 minutes, the ablation was performed. Once completed, the needle was removed intact. S1 Medial Branch Nerve RFA: The target area for the S1 medial branch is located inferior to the junction of the S1 superior articular  process and the L5 inferior articular process, posterior, inferior, and lateral to the 6 o'clock position of the L5-S1 facet joint, just superior to the S1 posterior foramen. Under fluoroscopic guidance, the Radiofrequency needle was advanced until contact was made with os over the Target area. Sensory and motor testing was conducted to properly adjust the position of the needle. Once satisfactory placement of the needle was achieved, the numbing solution was slowly injected after negative aspiration for blood. 2.0 mL of the nerve block solution was injected without difficulty or complication. After waiting for at least 3 minutes, the ablation was performed. Once completed, the needle was removed intact. Radiofrequency lesioning (ablation):  Radiofrequency Generator: NeuroTherm [id number] Sensory Stimulation Parameters: 50 Hz was used to locate & identify the nerve, making sure that the needle was positioned such that there was no sensory stimulation below 0.3 V or above 0.7 V. Motor Stimulation Parameters: 2 Hz was used to evaluate the motor component. Care was taken not to lesion any nerves that demonstrated motor stimulation of the lower extremities at an output of less than 2.5 times that of the sensory threshold, or a maximum of 2.0 V. Lesioning Technique Parameters: Standard Radiofrequency settings. (Not bipolar or pulsed.) Temperature Settings: 80 degrees C Lesioning time: 60 seconds Intra-operative Compliance: Non-compliant. Patient continued to move after the administration of the local anesthetic, despite repeated requests not to do so.  Chance of that this radiofrequency will work for this patient are rather slim.  The patient did not cooperate during the procedure and moved significantly causing the needles to be dislodged several times.  In addition to that described on losing weight, this patient appears to continue gaining more weight.  At this point, we will no longer  be offering any further  radiofrequencies this patient, until she brings her BMI below 35. Materials & Medications: Needle(s) (Electrode/Cannula) Type: Teflon-coated, curved tip, Radiofrequency needle(s) Gauge: 20G Length: 15cm Numbing solution: 0.2% PF-Ropivacaine + Triamcinolone (40 mg/mL) diluted to a final concentration of 4 mg of Triamcinolone/mL of Ropivacaine The unused portion of the solution was discarded in the proper designated containers.  Once the entire procedure was completed, the treated area was cleaned, making sure to leave some of the prepping solution back to take advantage of its long term bactericidal properties.  Illustration of the posterior view of the lumbar spine and the posterior neural structures. Laminae of L2 through S1 are labeled. DPRL5, dorsal primary ramus of L5; DPRS1, dorsal primary ramus of S1; DPR3, dorsal primary ramus of L3; FJ, facet (zygapophyseal) joint L3-L4; I, inferior articular process of L4; LB1, lateral branch of dorsal primary ramus of L1; IAB, inferior articular branches from L3 medial branch (supplies L4-L5 facet joint); IBP, intermediate branch plexus; MB3, medial branch of dorsal primary ramus of L3; NR3, third lumbar nerve root; S, superior articular process of L5; SAB, superior articular branches from L4 (supplies L4-5 facet joint also); TP3, transverse process of L3.  Vitals:   04/08/18 1449 04/08/18 1457 04/08/18 1507 04/08/18 1517  BP: (!) 118/92 (!) 145/80 (!) 119/91 114/74  Pulse:      Resp:  17 17 15   Temp:  97.6 F (36.4 C)  98 F (36.7 C)  TempSrc:      SpO2:  96% 99% 99%  Weight:      Height:        Start Time: 1400 hrs. End Time: 1445 hrs.  Imaging Guidance (Spinal):          Type of Imaging Technique: Fluoroscopy Guidance (Spinal) Indication(s): Assistance in needle guidance and placement for procedures requiring needle placement in or near specific anatomical locations not easily accessible without such assistance. Exposure Time: Please see  nurses notes. Contrast: None used. Fluoroscopic Guidance: I was personally present during the use of fluoroscopy. "Tunnel Vision Technique" used to obtain the best possible view of the target area. Parallax error corrected before commencing the procedure. "Direction-depth-direction" technique used to introduce the needle under continuous pulsed fluoroscopy. Once target was reached, antero-posterior, oblique, and lateral fluoroscopic projection used confirm needle placement in all planes. Images permanently stored in EMR. Interpretation: No contrast injected. I personally interpreted the imaging intraoperatively. Adequate needle placement confirmed in multiple planes. Permanent images saved into the patient's record.  Antibiotic Prophylaxis:   Anti-infectives (From admission, onward)   None     Indication(s): None identified  Post-operative Assessment:  Post-procedure Vital Signs:  Pulse/HCG Rate: 100(!) 103 Temp: 98 F (36.7 C) Resp: 15 BP: 114/74 SpO2: 99 %  EBL: None  Complications: No immediate post-treatment complications observed by team, or reported by patient.  Note: The patient tolerated the entire procedure well. A repeat set of vitals were taken after the procedure and the patient was kept under observation following institutional policy, for this type of procedure. Post-procedural neurological assessment was performed, showing return to baseline, prior to discharge. The patient was provided with post-procedure discharge instructions, including a section on how to identify potential problems. Should any problems arise concerning this procedure, the patient was given instructions to immediately contact us, at any time, without hesitation. In any case, we plan to contact the patient by telephone for a follow-up status report regarding this interventional procedure.  Comments:  No additional relevant information.  Plan of Care   Interventional management options: Planned,  scheduled, and/or pending:   NO RFA until BMI is < 35. None at this time.   Considering:   Diagnostic midline CESI Diagnostic bilateral cervical facet block Possible bilateral cervical facet RFA Diagnostic bilateral lumbar facet nerve block#2 Possiblebilateral lumbar facet RFA Diagnostic bilateral L4 transforaminal LESI Diagnostic right-sided L5-S1 LESI Diagnostic right-sided caudal epidural steroid injection#3   Palliative PRN treatment(s):   Palliative bilateral lumbar facet blocks    Imaging Orders     DG C-Arm 1-60 Min-No Report  Procedure Orders     Radiofrequency,Lumbar  Medications ordered for procedure: Meds ordered this encounter  Medications  . lidocaine (XYLOCAINE) 2 % (with pres) injection 400 mg  . midazolam (VERSED) 5 MG/5ML injection 1-2 mg    Make sure Flumazenil is available in the pyxis when using this medication. If oversedation occurs, administer 0.2 mg IV over 15 sec. If after 45 sec no response, administer 0.2 mg again over 1 min; may repeat at 1 min intervals; not to exceed 4 doses (1 mg)  . fentaNYL (SUBLIMAZE) injection 25-50 mcg    Make sure Narcan is available in the pyxis when using this medication. In the event of respiratory depression (RR< 8/min): Titrate NARCAN (naloxone) in increments of 0.1 to 0.2 mg IV at 2-3 minute intervals, until desired degree of reversal.  . lactated ringers infusion 1,000 mL  . ropivacaine (PF) 2 mg/mL (0.2%) (NAROPIN) injection 9 mL  . triamcinolone acetonide (KENALOG-40) injection 40 mg  . HYDROcodone-acetaminophen (NORCO/VICODIN) 5-325 MG tablet    Sig: Take 1 tablet by mouth every 8 (eight) hours as needed for up to 7 days for severe pain. Must last 7 days.    Dispense:  21 tablet    Refill:  0    For acute post-operative pain. Not to be refilled. Must last 7 days.  Marland Kitchen HYDROcodone-acetaminophen (NORCO/VICODIN) 5-325 MG tablet    Sig: Take 1 tablet by mouth every 8 (eight) hours as needed for up to 7 days  for severe pain. Must last for 7 days.    Dispense:  21 tablet    Refill:  0    For acute post-operative pain. Not to be refilled. Must last 7 days.   Medications administered: We administered lidocaine, midazolam, fentaNYL, lactated ringers, ropivacaine (PF) 2 mg/mL (0.2%), and triamcinolone acetonide.  See the medical record for exact dosing, route, and time of administration.  Disposition: Discharge home  Discharge Date & Time: 04/08/2018; 1521 hrs.   Physician-requested Follow-up: Return for Post-RFA eval (6 wks), w/ Thad Ranger, NP.  Future Appointments  Date Time Provider Department Center  04/15/2018  9:30 AM Barbette Merino, NP [facility] None  05/22/2018  1:45 PM Barbette Merino, NP [facility] None   Primary Care Physician: Titus Mould, NP Location: [facility] Note by: Oswaldo Done, MD Date: 04/08/2018; Time: 4:23 PM  Disclaimer:  Medicine is not an Visual merchandiser. The only guarantee in medicine is that nothing is guaranteed. It is important to note that the decision to proceed with this intervention was based on the information collected from the patient. The Data and conclusions were drawn from the patient's questionnaire, the interview, and the physical examination. Because the information was provided in large part by the patient, it cannot be guaranteed that it has not been purposely or unconsciously manipulated. Every effort has been made to obtain as much relevant data as possible for this evaluation. It  is important to note that the conclusions that lead to this procedure are derived in large part from the available data. Always take into account that the treatment will also be dependent on availability of resources and existing treatment guidelines, considered by other Pain Management Practitioners as being common knowledge and practice, at the time of the intervention. For Medico-Legal purposes, it is also important to point out  that variation in procedural techniques and pharmacological choices are the acceptable norm. The indications, contraindications, technique, and results of the above procedure should only be interpreted and judged by a Board-Certified Interventional Pain Specialist with extensive familiarity and expertise in the same exact procedure and technique.

## 2018-04-09 ENCOUNTER — Telehealth: Payer: Self-pay

## 2018-04-09 NOTE — Telephone Encounter (Signed)
Called patient. She was unable to hear me talking. Instructed to call if needed whenphone connection was better.

## 2018-04-10 ENCOUNTER — Ambulatory Visit: Payer: Medicare Other | Admitting: Nurse Practitioner

## 2018-04-15 ENCOUNTER — Other Ambulatory Visit: Payer: Self-pay

## 2018-04-15 ENCOUNTER — Encounter: Payer: Self-pay | Admitting: Nurse Practitioner

## 2018-04-15 ENCOUNTER — Ambulatory Visit: Payer: Medicare Other | Attending: Nurse Practitioner | Admitting: Nurse Practitioner

## 2018-04-15 ENCOUNTER — Ambulatory Visit: Payer: Medicare Other | Admitting: Pain Medicine

## 2018-04-15 VITALS — BP 145/95 | HR 96 | Temp 98.8°F | Ht 62.0 in | Wt 234.0 lb

## 2018-04-15 DIAGNOSIS — J449 Chronic obstructive pulmonary disease, unspecified: Secondary | ICD-10-CM | POA: Insufficient documentation

## 2018-04-15 DIAGNOSIS — E039 Hypothyroidism, unspecified: Secondary | ICD-10-CM | POA: Diagnosis not present

## 2018-04-15 DIAGNOSIS — Z9889 Other specified postprocedural states: Secondary | ICD-10-CM | POA: Diagnosis not present

## 2018-04-15 DIAGNOSIS — Z886 Allergy status to analgesic agent status: Secondary | ICD-10-CM | POA: Diagnosis not present

## 2018-04-15 DIAGNOSIS — Z9049 Acquired absence of other specified parts of digestive tract: Secondary | ICD-10-CM | POA: Insufficient documentation

## 2018-04-15 DIAGNOSIS — Z79890 Hormone replacement therapy (postmenopausal): Secondary | ICD-10-CM | POA: Insufficient documentation

## 2018-04-15 DIAGNOSIS — I129 Hypertensive chronic kidney disease with stage 1 through stage 4 chronic kidney disease, or unspecified chronic kidney disease: Secondary | ICD-10-CM | POA: Insufficient documentation

## 2018-04-15 DIAGNOSIS — Z950 Presence of cardiac pacemaker: Secondary | ICD-10-CM | POA: Insufficient documentation

## 2018-04-15 DIAGNOSIS — Z8673 Personal history of transient ischemic attack (TIA), and cerebral infarction without residual deficits: Secondary | ICD-10-CM | POA: Insufficient documentation

## 2018-04-15 DIAGNOSIS — Z79891 Long term (current) use of opiate analgesic: Secondary | ICD-10-CM

## 2018-04-15 DIAGNOSIS — M47817 Spondylosis without myelopathy or radiculopathy, lumbosacral region: Secondary | ICD-10-CM | POA: Insufficient documentation

## 2018-04-15 DIAGNOSIS — E1122 Type 2 diabetes mellitus with diabetic chronic kidney disease: Secondary | ICD-10-CM | POA: Insufficient documentation

## 2018-04-15 DIAGNOSIS — Z90710 Acquired absence of both cervix and uterus: Secondary | ICD-10-CM | POA: Diagnosis not present

## 2018-04-15 DIAGNOSIS — R001 Bradycardia, unspecified: Secondary | ICD-10-CM | POA: Insufficient documentation

## 2018-04-15 DIAGNOSIS — E11649 Type 2 diabetes mellitus with hypoglycemia without coma: Secondary | ICD-10-CM | POA: Insufficient documentation

## 2018-04-15 DIAGNOSIS — Z7901 Long term (current) use of anticoagulants: Secondary | ICD-10-CM | POA: Insufficient documentation

## 2018-04-15 DIAGNOSIS — E785 Hyperlipidemia, unspecified: Secondary | ICD-10-CM | POA: Diagnosis not present

## 2018-04-15 DIAGNOSIS — R51 Headache: Secondary | ICD-10-CM

## 2018-04-15 DIAGNOSIS — E1142 Type 2 diabetes mellitus with diabetic polyneuropathy: Secondary | ICD-10-CM | POA: Insufficient documentation

## 2018-04-15 DIAGNOSIS — G2581 Restless legs syndrome: Secondary | ICD-10-CM | POA: Insufficient documentation

## 2018-04-15 DIAGNOSIS — Z885 Allergy status to narcotic agent status: Secondary | ICD-10-CM | POA: Insufficient documentation

## 2018-04-15 DIAGNOSIS — G894 Chronic pain syndrome: Secondary | ICD-10-CM | POA: Diagnosis not present

## 2018-04-15 DIAGNOSIS — Z8249 Family history of ischemic heart disease and other diseases of the circulatory system: Secondary | ICD-10-CM | POA: Diagnosis not present

## 2018-04-15 DIAGNOSIS — Z79899 Other long term (current) drug therapy: Secondary | ICD-10-CM | POA: Insufficient documentation

## 2018-04-15 DIAGNOSIS — Z888 Allergy status to other drugs, medicaments and biological substances status: Secondary | ICD-10-CM | POA: Insufficient documentation

## 2018-04-15 DIAGNOSIS — N189 Chronic kidney disease, unspecified: Secondary | ICD-10-CM | POA: Insufficient documentation

## 2018-04-15 DIAGNOSIS — G4486 Cervicogenic headache: Secondary | ICD-10-CM

## 2018-04-15 DIAGNOSIS — Z5181 Encounter for therapeutic drug level monitoring: Secondary | ICD-10-CM | POA: Insufficient documentation

## 2018-04-15 DIAGNOSIS — Z7902 Long term (current) use of antithrombotics/antiplatelets: Secondary | ICD-10-CM | POA: Insufficient documentation

## 2018-04-15 DIAGNOSIS — M797 Fibromyalgia: Secondary | ICD-10-CM | POA: Insufficient documentation

## 2018-04-15 DIAGNOSIS — M549 Dorsalgia, unspecified: Secondary | ICD-10-CM | POA: Insufficient documentation

## 2018-04-15 MED ORDER — OXYCODONE-ACETAMINOPHEN 5-325 MG PO TABS: 1.0000 | ORAL_TABLET | Freq: Two times a day (BID) | ORAL | 0 refills | Status: AC | PRN

## 2018-04-15 MED ORDER — LIDOCAINE 5 % EX OINT: 1.0000 "application " | TOPICAL_OINTMENT | Freq: Four times a day (QID) | CUTANEOUS | 2 refills | Status: AC | PRN

## 2018-04-15 MED ORDER — OXYCODONE-ACETAMINOPHEN 5-325 MG PO TABS: 1.0000 | ORAL_TABLET | Freq: Two times a day (BID) | ORAL | 0 refills | Status: AC

## 2018-04-15 NOTE — Progress Notes (Signed)
Nursing Pain Medication Assessment:  Safety precautions to be maintained throughout the outpatient stay will include: orient to surroundings, keep bed in low position, maintain call bell within reach at all times, provide assistance with transfer out of bed and ambulation.  Medication Inspection Compliance: Pill count conducted under aseptic conditions, in front of the patient. Neither the pills nor the bottle was removed from the patient's sight at any time. Once count was completed pills were immediately returned to the patient in their original bottle.  Medication: Oxycodone/APAP Pill/Patch Count: 1 of 60 pills remain Pill/Patch Appearance: Markings consistent with prescribed medication Bottle Appearance: Standard pharmacy container. Clearly labeled. Filled Date: 10 / 26 / 2019 Last Medication intake:  Today

## 2018-04-15 NOTE — Progress Notes (Signed)
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 will not be "Called in", "Faxed", or "electronically sent". You will need to get another appointment to get these prescribed. No early refills. Do not call asking to have your prescription filled early. 4. Prescription Accuracy: You are responsible for carefully inspecting your prescriptions before leaving our office. Have the discharge nurse carefully go over each prescription with you, before taking them home. Make sure that your name is accurately spelled, that your address is correct. Check the name and dose of your medication to make sure it is accurate. Check the number of pills, and the written instructions to make sure they are clear and accurate. Make sure that you are given enough medication to last until your next medication refill appointment. 5. Taking Medication: Take  medication as prescribed. When it comes to controlled substances, taking less pills or less frequently than prescribed is permitted and encouraged. Never take more pills than instructed. Never take medication more frequently than prescribed.  6. Inform other Doctors: Always inform, all of your healthcare providers, of all the medications you take. 7. Pain Medication from other Providers: You are not allowed to accept any additional pain medication from any other Doctor or Healthcare provider. There are two exceptions to this rule. (see below) In the event that you require additional pain medication, you are responsible for notifying us, as stated below. 8. Medication Agreement: You are responsible for carefully reading and following our Medication Agreement. This must be signed before receiving any prescriptions from our practice. Safely store a copy of your signed Agreement. Violations to the Agreement will result in no further prescriptions. (Additional copies of our Medication Agreement are available upon request.) 9. Laws, Rules, & Regulations: All patients are expected to follow all Federal and [employer], Rules, [employer]. Ignorance of the Laws does not constitute a valid excuse. The use of any illegal substances is prohibited. 10. Adopted CDC guidelines & recommendations: Target dosing levels will be at or below 60 MME/day. Use of benzodiazepines** is not recommended.  Exceptions: There are only two exceptions to the rule of not receiving pain medications from other Healthcare Providers. 1. Exception #1 (Emergencies): In the event of an emergency (i.e.: accident requiring emergency care), you are allowed to receive additional pain medication. However, you are responsible for: As soon as you are able, call our office [phone], at any time of the day or night, and leave a message stating your name, the date and nature of the emergency, and the name and dose of the medication  prescribed. In the event that your call is answered by a member of our staff, make sure to document and save the date, time, and the name of the person that took your information.  2. Exception #2 (Planned Surgery): In the event that you are scheduled by another doctor or dentist to have any type of surgery or procedure, you are allowed (for a period no longer than 30 days), to receive additional pain medication, for the acute post-op pain. However, in  this case, you are responsible for picking up a copy of our "Post-op Pain Management for Surgeons" handout, and giving it to your surgeon or dentist. This document is available at our office, and does not require an appointment to obtain it. Simply go to our office during business hours (Monday-Thursday from 8:00 AM to 4:00 PM) (Friday 8:00 AM to 12:00 Noon) or if you have a scheduled appointment with [place], prior to your surgery, and ask for it by name. In addition, you will need to provide [place] with your name, name of your surgeon, type of surgery, and date of procedure or surgery.  *Opioid medications include: morphine, codeine, oxycodone, oxymorphone, hydrocodone, hydromorphone, meperidine, tramadol, tapentadol, buprenorphine, fentanyl, methadone. **Benzodiazepine medications include: diazepam (Valium), alprazolam (Xanax), clonazepam (Klonopine), lorazepam (Ativan), clorazepate (Tranxene), chlordiazepoxide (Librium), estazolam (Prosom), oxazepam (Serax), temazepam (Restoril), triazolam (Halcion) (Last updated: 07/18/2017) ____________________________________________________________________________________________   BMI Assessment: Estimated body mass index is 42.8 kg/m as calculated from the following:   Height as of this encounter: _0  (1.575 m).   Weight as of this encounter: 234 lb (106.1 kg).  BMI interpretation table: BMI level Category Range association with higher incidence of chronic pain  <18 kg/m2 Underweight   18.5-24.9 kg/m2 Ideal body  weight   25-29.9 kg/m2 Overweight Increased incidence by 20%  30-34.9 kg/m2 Obese (Class I) Increased incidence by 68%  35-39.9 kg/m2 Severe obesity (Class II) Increased incidence by 136%  >40 kg/m2 Extreme obesity (Class III) Increased incidence by 254%   Patient's current BMI Ideal Body weight  Body mass index is 42.8 kg/m. Ideal body weight: 50.1 kg (110 lb 7.2 oz) Adjusted ideal body weight: 72.5 kg (159 lb 13.9 oz)   BMI Readings from Last 4 Encounters:  04/15/18 42.80 kg/m  04/08/18 43.53 kg/m  03/13/18 43.53 kg/m  02/13/18 43.71 kg/m   Wt Readings from Last 4 Encounters:  04/15/18 234 lb (106.1 kg)  04/08/18 238 lb (108 kg)  03/13/18 238 lb (108 kg)  02/13/18 239 lb (108.4 kg)

## 2018-04-15 NOTE — Patient Instructions (Addendum)
____________________________________________________________________________________________  Medication Rules  Purpose: To inform patients, and their family members, of our rules and regulations.  Applies to: All patients receiving prescriptions (written or electronic).  Pharmacy of record: Pharmacy where electronic prescriptions will be sent. If written prescriptions are taken to a different pharmacy, please inform the nursing staff. The pharmacy listed in the electronic medical record should be the one where you would like electronic prescriptions to be sent.  Electronic prescriptions: In compliance with the [place] Strengthen Opioid Misuse Prevention (STOP) Act of 2017 (Session Law 2017-74/H243), effective May 21, 2018, all controlled substances must be electronically prescribed. Calling prescriptions to the pharmacy will cease to exist.  Prescription refills: Only during scheduled appointments. Applies to all prescriptions.  NOTE: The following applies primarily to controlled substances (Opioid* Pain Medications).   Patient's responsibilities: 1. Pain Pills: Bring all pain pills to every appointment (except for procedure appointments). 2. Pill Bottles: Bring pills in original pharmacy bottle. Always bring the newest bottle. Bring bottle, even if empty. 3. Medication refills: You are responsible for knowing and keeping track of what medications you take and those you need refilled. The day before your appointment: write a list of all prescriptions that need to be refilled. The day of the appointment: give the list to the admitting nurse. Prescriptions will be written only during appointments. If you forget a medication: it will not be "Called in", "Faxed", or "electronically sent". You will need to get another appointment to get these prescribed. No early refills. Do not call asking to have your prescription filled early. 4. Prescription Accuracy: You are responsible for  carefully inspecting your prescriptions before leaving our office. Have the discharge nurse carefully go over each prescription with you, before taking them home. Make sure that your name is accurately spelled, that your address is correct. Check the name and dose of your medication to make sure it is accurate. Check the number of pills, and the written instructions to make sure they are clear and accurate. Make sure that you are given enough medication to last until your next medication refill appointment. 5. Taking Medication: Take medication as prescribed. When it comes to controlled substances, taking less pills or less frequently than prescribed is permitted and encouraged. Never take more pills than instructed. Never take medication more frequently than prescribed.  6. Inform other Doctors: Always inform, all of your healthcare providers, of all the medications you take. 7. Pain Medication from other Providers: You are not allowed to accept any additional pain medication from any other Doctor or Healthcare provider. There are two exceptions to this rule. (see below) In the event that you require additional pain medication, you are responsible for notifying us, as stated below. 8. Medication Agreement: You are responsible for carefully reading and following our Medication Agreement. This must be signed before receiving any prescriptions from our practice. Safely store a copy of your signed Agreement. Violations to the Agreement will result in no further prescriptions. (Additional copies of our Medication Agreement are available upon request.) 9. Laws, Rules, & Regulations: All patients are expected to follow all Federal and State Laws, Statutes, Rules, & Regulations. Ignorance of the Laws does not constitute a valid excuse. The use of any illegal substances is prohibited. 10. Adopted CDC guidelines & recommendations: Target dosing levels will be at or below 60 MME/day. Use of benzodiazepines** is not  recommended.  Exceptions: There are only two exceptions to the rule of not receiving pain medications from other Healthcare Providers. 1.   Exception #1 (Emergencies): In the event of an emergency (i.e.: accident requiring emergency care), you are allowed to receive additional pain medication. However, you are responsible for: As soon as you are able, call our office [phone], at any time of the day or night, and leave a message stating your name, the date and nature of the emergency, and the name and dose of the medication prescribed. In the event that your call is answered by a member of our staff, make sure to document and save the date, time, and the name of the person that took your information.  2. Exception #2 (Planned Surgery): In the event that you are scheduled by another doctor or dentist to have any type of surgery or procedure, you are allowed (for a period no longer than 30 days), to receive additional pain medication, for the acute post-op pain. However, in this case, you are responsible for picking up a copy of our "Post-op Pain Management for Surgeons" handout, and giving it to your surgeon or dentist. This document is available at our office, and does not require an appointment to obtain it. Simply go to our office during business hours (Monday-Thursday from 8:00 AM to 4:00 PM) (Friday 8:00 AM to 12:00 Noon) or if you have a scheduled appointment with [place], prior to your surgery, and ask for it by name. In addition, you will need to provide [place] with your name, name of your surgeon, type of surgery, and date of procedure or surgery.  *Opioid medications include: morphine, codeine, oxycodone, oxymorphone, hydrocodone, hydromorphone, meperidine, tramadol, tapentadol, buprenorphine, fentanyl, methadone. **Benzodiazepine medications include: diazepam (Valium), alprazolam (Xanax), clonazepam (Klonopine), lorazepam (Ativan), clorazepate (Tranxene), chlordiazepoxide (Librium), estazolam (Prosom),  oxazepam (Serax), temazepam (Restoril), triazolam (Halcion) (Last updated: 07/18/2017) ____________________________________________________________________________________________   BMI Assessment: Estimated body mass index is 42.8 kg/m as calculated from the following:   Height as of this encounter: 5\' 2"  (1.575 m).   Weight as of this encounter: 234 lb (106.1 kg).  BMI interpretation table: BMI level Category Range association with higher incidence of chronic pain  <18 kg/m2 Underweight   18.5-24.9 kg/m2 Ideal body weight   25-29.9 kg/m2 Overweight Increased incidence by 20%  30-34.9 kg/m2 Obese (Class I) Increased incidence by 68%  35-39.9 kg/m2 Severe obesity (Class II) Increased incidence by 136%  >40 kg/m2 Extreme obesity (Class III) Increased incidence by 254%   Patient's current BMI Ideal Body weight  Body mass index is 42.8 kg/m. Ideal body weight: 50.1 kg (110 lb 7.2 oz) Adjusted ideal body weight: 72.5 kg (159 lb 13.9 oz)   BMI Readings from Last 4 Encounters:  04/15/18 42.80 kg/m  04/08/18 43.53 kg/m  03/13/18 43.53 kg/m  02/13/18 43.71 kg/m   Wt Readings from Last 4 Encounters:  04/15/18 234 lb (106.1 kg)  04/08/18 238 lb (108 kg)  03/13/18 238 lb (108 kg)  02/13/18 239 lb (108.4 kg)

## 2018-05-21 DEATH — deceased

## 2018-05-22 ENCOUNTER — Ambulatory Visit: Payer: Medicare Other | Admitting: Nurse Practitioner

## 2018-07-09 ENCOUNTER — Encounter: Payer: Medicare Other | Admitting: Nurse Practitioner

## 2019-07-26 IMAGING — CT CT HEAD W/O CM
3 series · 16 of 45 positions shown, 19 images · non-contrast
Comparison: 12/31/2017

CLINICAL DATA: Stroke, follow-up

EXAM:
CT HEAD WITHOUT CONTRAST
TECHNIQUE: Contiguous axial images were obtained from the base of the skull
through the vertex without intravenous contrast.

[Series 3: head wo · axial · 0.39mm/px · z∈[-119,-4]mm · 10 of 28 slices shown, 13 images]
[im 3/28  brain]
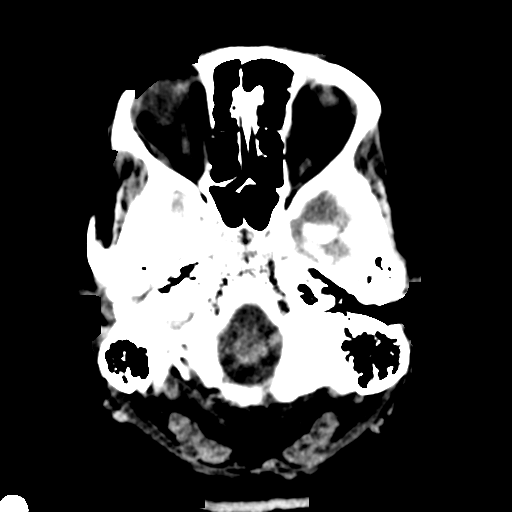
[im 3/28  bone]
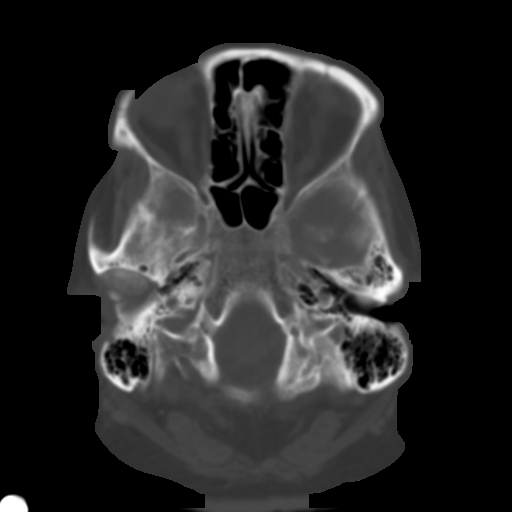
[im 5/28  brain]
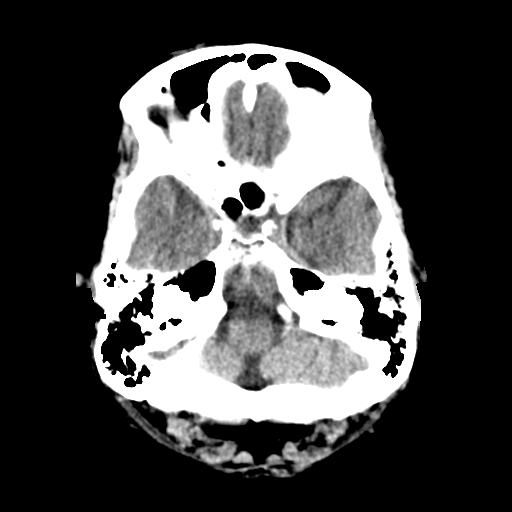
[im 8/28  brain]
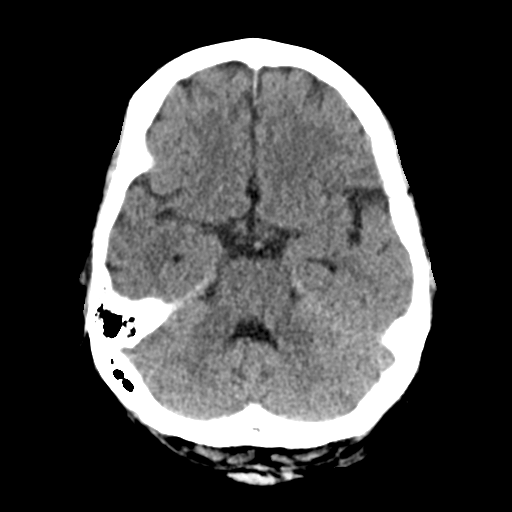
[im 11/28  brain]
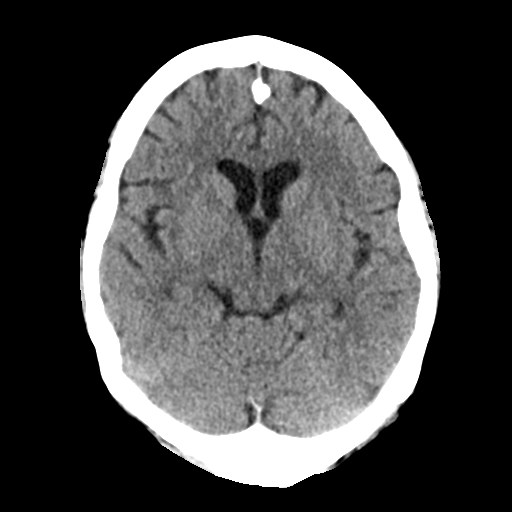
[im 13/28  brain]
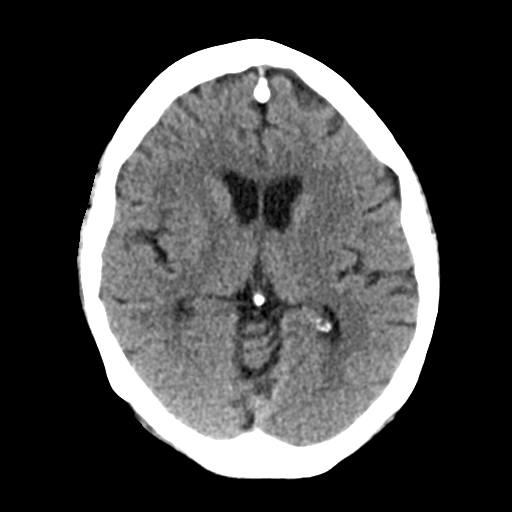
[im 13/28  bone]
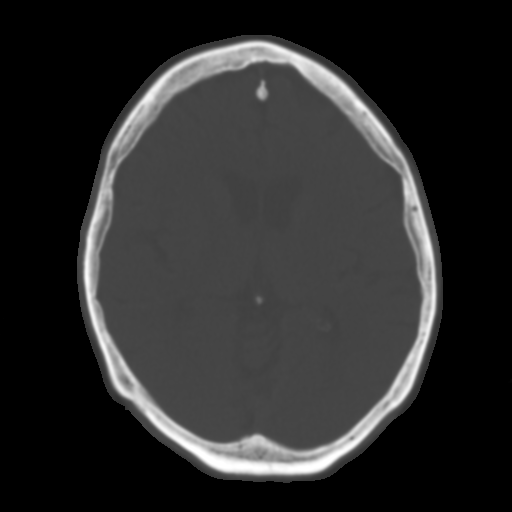
[im 16/28  brain]
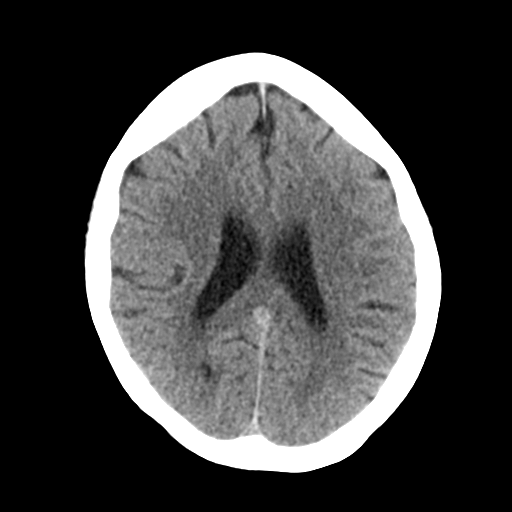
[im 18/28  brain]
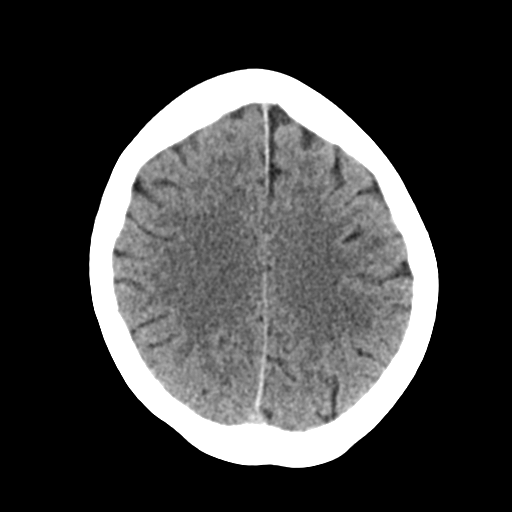
[im 21/28  brain]
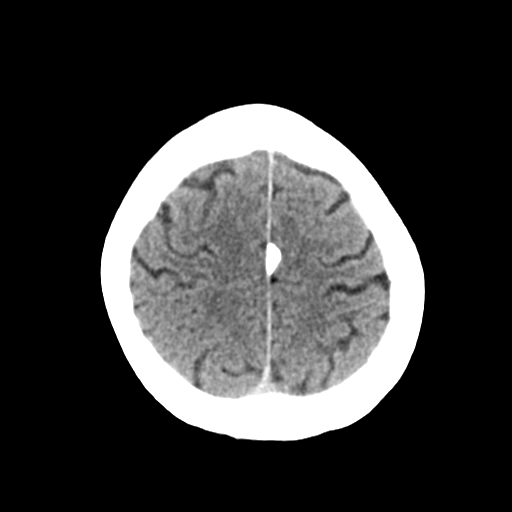
[im 24/28  brain]
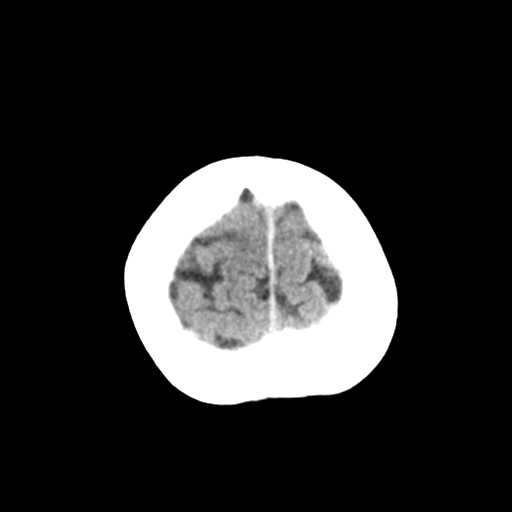
[im 24/28  bone]
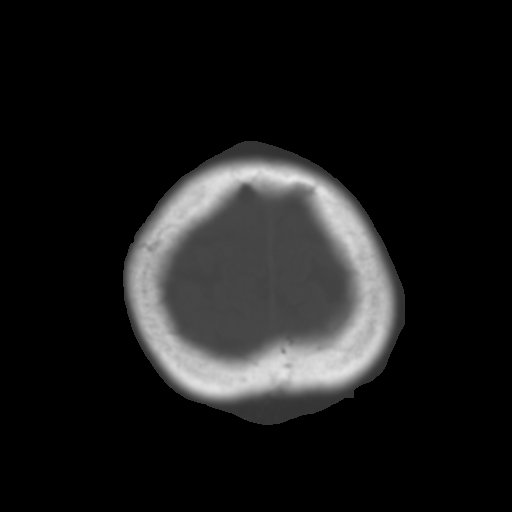
[im 26/28  brain]
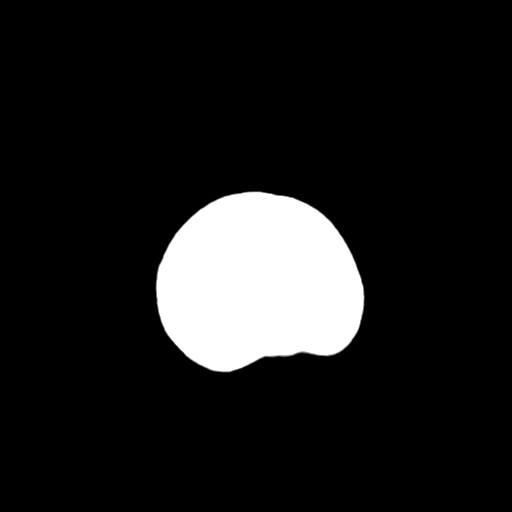

[Series 4: coronal soft tissue · coronal · 0.29mm/px · 3 of 63 slices shown]
[im 21/63  brain]
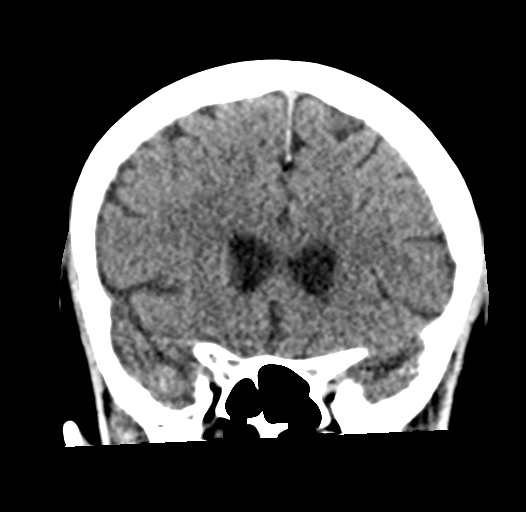
[im 28/63  brain]
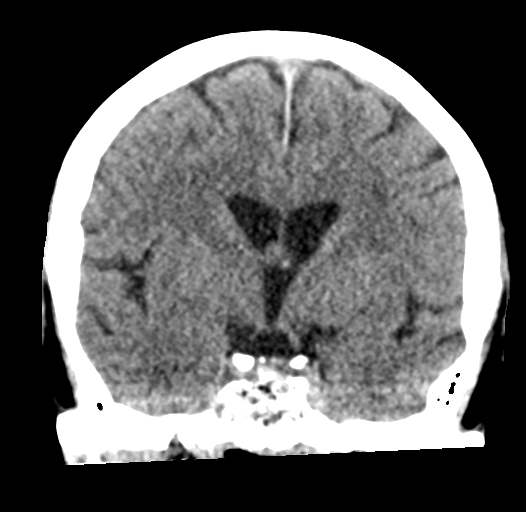
[im 35/63  brain]
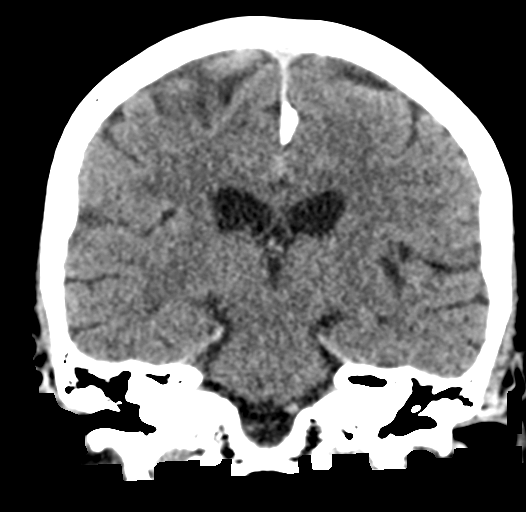

[Series 5: sagittal soft tissue · sagittal · 0.31mm/px · 3 of 49 slices shown]
[im 17/49  brain]
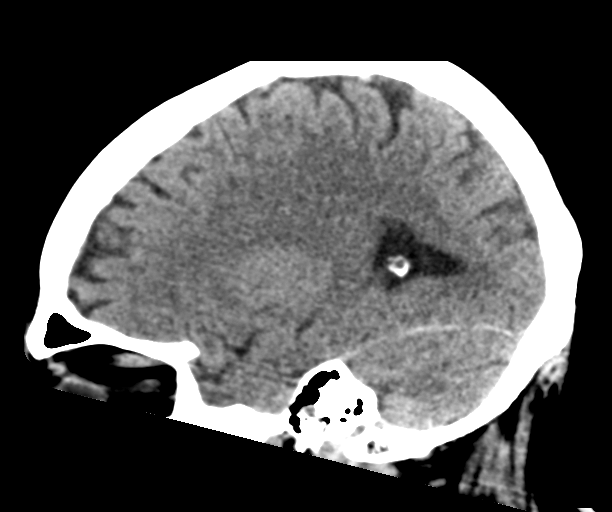
[im 25/49  brain]
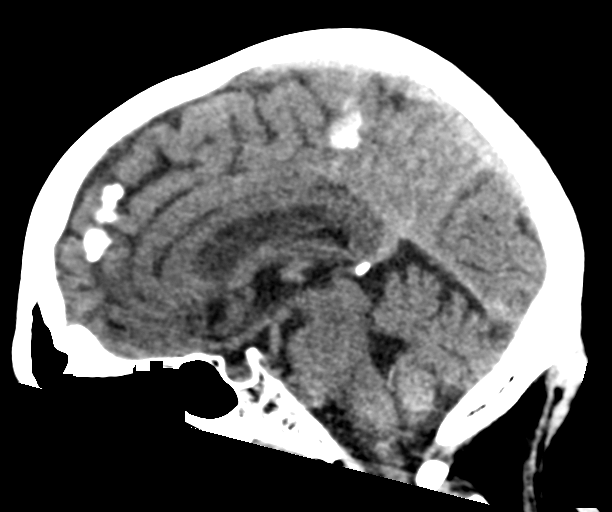
[im 33/49  brain]
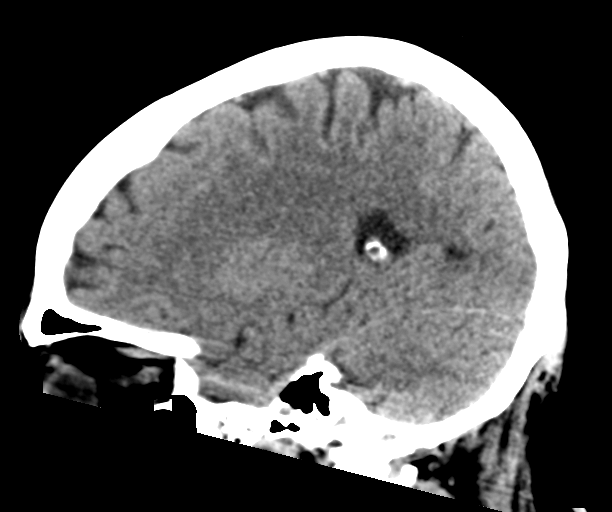

[16 of 45 positions shown; findings below may reference images not displayed]

FINDINGS: Brain: No acute intracranial abnormality. Specifically, no
hemorrhage, hydrocephalus, mass lesion, acute infarction, or
significant intracranial injury.

Vascular: No hyperdense vessel or unexpected calcification.

Skull: No acute calvarial abnormality.

Sinuses/Orbits: Visualized paranasal sinuses and mastoids clear.
Orbital soft tissues unremarkable.

Other: None
IMPRESSION: No acute intracranial abnormality.
